# Patient Record
Sex: Female | Born: 1945 | Race: White | Hispanic: No | Marital: Married | Smoking: Never smoker
Health system: Southern US, Community
[De-identification: ages and names within clinical notes are randomized; demographics above are authoritative.]

## PROBLEM LIST (undated history)

## (undated) DIAGNOSIS — R51 Headache: Secondary | ICD-10-CM

## (undated) DIAGNOSIS — F329 Major depressive disorder, single episode, unspecified: Secondary | ICD-10-CM

## (undated) DIAGNOSIS — F32A Depression, unspecified: Secondary | ICD-10-CM

## (undated) DIAGNOSIS — K5790 Diverticulosis of intestine, part unspecified, without perforation or abscess without bleeding: Secondary | ICD-10-CM

## (undated) DIAGNOSIS — R112 Nausea with vomiting, unspecified: Secondary | ICD-10-CM

## (undated) DIAGNOSIS — R519 Headache, unspecified: Secondary | ICD-10-CM

## (undated) DIAGNOSIS — F419 Anxiety disorder, unspecified: Secondary | ICD-10-CM

## (undated) DIAGNOSIS — K219 Gastro-esophageal reflux disease without esophagitis: Secondary | ICD-10-CM

## (undated) DIAGNOSIS — G8929 Other chronic pain: Secondary | ICD-10-CM

## (undated) HISTORY — DX: Other chronic pain: G89.29

## (undated) HISTORY — DX: Headache, unspecified: R51.9

## (undated) HISTORY — DX: Anxiety disorder, unspecified: F41.9

## (undated) HISTORY — PX: COLONOSCOPY: SHX174

## (undated) HISTORY — DX: Major depressive disorder, single episode, unspecified: F32.9

## (undated) HISTORY — DX: Headache: R51

## (undated) HISTORY — DX: Gastro-esophageal reflux disease without esophagitis: K21.9

## (undated) HISTORY — DX: Diverticulosis of intestine, part unspecified, without perforation or abscess without bleeding: K57.90

## (undated) HISTORY — PX: TUBAL LIGATION: SHX77

## (undated) HISTORY — DX: Depression, unspecified: F32.A

---

## 1989-03-14 HISTORY — PX: ABDOMINAL HYSTERECTOMY: SHX81

## 2001-08-21 ENCOUNTER — Encounter: Payer: Self-pay | Admitting: Family Medicine

## 2001-08-21 LAB — CONVERTED CEMR LAB: Blood Glucose, Fasting: 92 mg/dL

## 2002-10-11 ENCOUNTER — Encounter: Payer: Self-pay | Admitting: Family Medicine

## 2002-10-11 LAB — CONVERTED CEMR LAB
RBC count: 4.33 10*6/uL
WBC, blood: 6.3 10*3/uL

## 2005-01-12 ENCOUNTER — Encounter: Payer: Self-pay | Admitting: Family Medicine

## 2005-02-07 ENCOUNTER — Ambulatory Visit: Payer: Self-pay | Admitting: Family Medicine

## 2005-03-10 ENCOUNTER — Ambulatory Visit: Payer: Self-pay | Admitting: Family Medicine

## 2005-05-06 ENCOUNTER — Ambulatory Visit: Payer: Self-pay | Admitting: Family Medicine

## 2005-05-16 ENCOUNTER — Ambulatory Visit: Payer: Self-pay | Admitting: Family Medicine

## 2005-09-09 ENCOUNTER — Ambulatory Visit: Payer: Self-pay | Admitting: Family Medicine

## 2005-11-25 ENCOUNTER — Ambulatory Visit: Payer: Self-pay | Admitting: Family Medicine

## 2006-01-27 ENCOUNTER — Ambulatory Visit: Payer: Self-pay | Admitting: Family Medicine

## 2006-01-27 LAB — CONVERTED CEMR LAB: Blood Glucose, Fasting: 98 mg/dL

## 2006-02-08 ENCOUNTER — Ambulatory Visit: Payer: Self-pay | Admitting: Family Medicine

## 2006-02-08 ENCOUNTER — Other Ambulatory Visit: Admission: RE | Admit: 2006-02-08 | Discharge: 2006-02-08 | Payer: Self-pay | Admitting: Family Medicine

## 2006-02-08 ENCOUNTER — Encounter: Payer: Self-pay | Admitting: Family Medicine

## 2006-02-24 ENCOUNTER — Ambulatory Visit: Payer: Self-pay | Admitting: Family Medicine

## 2006-03-08 ENCOUNTER — Ambulatory Visit: Payer: Self-pay | Admitting: Internal Medicine

## 2006-03-22 ENCOUNTER — Ambulatory Visit: Payer: Self-pay | Admitting: Family Medicine

## 2006-08-25 ENCOUNTER — Ambulatory Visit: Payer: Self-pay | Admitting: Family Medicine

## 2006-08-28 ENCOUNTER — Telehealth (INDEPENDENT_AMBULATORY_CARE_PROVIDER_SITE_OTHER): Payer: Self-pay | Admitting: *Deleted

## 2006-09-06 ENCOUNTER — Telehealth (INDEPENDENT_AMBULATORY_CARE_PROVIDER_SITE_OTHER): Payer: Self-pay | Admitting: *Deleted

## 2006-10-10 ENCOUNTER — Telehealth (INDEPENDENT_AMBULATORY_CARE_PROVIDER_SITE_OTHER): Payer: Self-pay | Admitting: *Deleted

## 2006-11-25 ENCOUNTER — Ambulatory Visit: Payer: Self-pay | Admitting: Internal Medicine

## 2006-12-08 ENCOUNTER — Telehealth: Payer: Self-pay | Admitting: Family Medicine

## 2007-02-21 ENCOUNTER — Encounter: Payer: Self-pay | Admitting: Family Medicine

## 2007-02-21 DIAGNOSIS — G43909 Migraine, unspecified, not intractable, without status migrainosus: Secondary | ICD-10-CM | POA: Insufficient documentation

## 2007-02-21 DIAGNOSIS — K219 Gastro-esophageal reflux disease without esophagitis: Secondary | ICD-10-CM

## 2007-02-21 DIAGNOSIS — M810 Age-related osteoporosis without current pathological fracture: Secondary | ICD-10-CM | POA: Insufficient documentation

## 2007-02-21 DIAGNOSIS — Z8669 Personal history of other diseases of the nervous system and sense organs: Secondary | ICD-10-CM

## 2007-02-21 DIAGNOSIS — F411 Generalized anxiety disorder: Secondary | ICD-10-CM

## 2007-02-21 DIAGNOSIS — F329 Major depressive disorder, single episode, unspecified: Secondary | ICD-10-CM

## 2007-02-21 DIAGNOSIS — F410 Panic disorder [episodic paroxysmal anxiety] without agoraphobia: Secondary | ICD-10-CM | POA: Insufficient documentation

## 2007-03-01 ENCOUNTER — Ambulatory Visit: Payer: Self-pay | Admitting: Family Medicine

## 2007-03-02 LAB — CONVERTED CEMR LAB
ALT: 19 units/L (ref 0–35)
AST: 19 units/L (ref 0–37)
Albumin: 4.1 g/dL (ref 3.5–5.2)
Alkaline Phosphatase: 72 units/L (ref 39–117)
BUN: 11 mg/dL (ref 6–23)
Bilirubin, Direct: 0.1 mg/dL (ref 0.0–0.3)
Calcium: 9.6 mg/dL (ref 8.4–10.5)
Chloride: 105 meq/L (ref 96–112)
GFR calc Af Amer: 110 mL/min
GFR calc non Af Amer: 91 mL/min
Total CHOL/HDL Ratio: 3.3
Triglycerides: 58 mg/dL (ref 0–149)
VLDL: 12 mg/dL (ref 0–40)

## 2007-03-05 ENCOUNTER — Ambulatory Visit: Payer: Self-pay | Admitting: Family Medicine

## 2007-03-06 ENCOUNTER — Telehealth: Payer: Self-pay | Admitting: Family Medicine

## 2007-03-19 ENCOUNTER — Telehealth (INDEPENDENT_AMBULATORY_CARE_PROVIDER_SITE_OTHER): Payer: Self-pay | Admitting: *Deleted

## 2007-03-26 ENCOUNTER — Encounter: Payer: Self-pay | Admitting: Family Medicine

## 2007-03-30 ENCOUNTER — Ambulatory Visit: Payer: Self-pay | Admitting: Family Medicine

## 2007-04-02 ENCOUNTER — Encounter (INDEPENDENT_AMBULATORY_CARE_PROVIDER_SITE_OTHER): Payer: Self-pay | Admitting: *Deleted

## 2007-09-11 ENCOUNTER — Telehealth: Payer: Self-pay | Admitting: Family Medicine

## 2007-12-10 ENCOUNTER — Ambulatory Visit: Payer: Self-pay | Admitting: Family Medicine

## 2007-12-17 ENCOUNTER — Telehealth: Payer: Self-pay | Admitting: Family Medicine

## 2007-12-17 ENCOUNTER — Ambulatory Visit: Payer: Self-pay | Admitting: Family Medicine

## 2007-12-20 ENCOUNTER — Telehealth: Payer: Self-pay | Admitting: Family Medicine

## 2008-01-18 ENCOUNTER — Telehealth: Payer: Self-pay | Admitting: Family Medicine

## 2008-02-12 ENCOUNTER — Telehealth: Payer: Self-pay | Admitting: Family Medicine

## 2008-02-27 ENCOUNTER — Telehealth: Payer: Self-pay | Admitting: Family Medicine

## 2008-06-09 ENCOUNTER — Ambulatory Visit: Payer: Self-pay | Admitting: Family Medicine

## 2008-06-09 DIAGNOSIS — E785 Hyperlipidemia, unspecified: Secondary | ICD-10-CM | POA: Insufficient documentation

## 2008-06-09 LAB — CONVERTED CEMR LAB
AST: 32 units/L (ref 0–37)
Alkaline Phosphatase: 79 units/L (ref 39–117)
Basophils Absolute: 0 10*3/uL (ref 0.0–0.1)
Basophils Relative: 0.6 % (ref 0.0–3.0)
Bilirubin, Direct: 0.1 mg/dL (ref 0.0–0.3)
CO2: 31 meq/L (ref 19–32)
Calcium: 9.3 mg/dL (ref 8.4–10.5)
Cholesterol: 253 mg/dL — ABNORMAL HIGH (ref 0–200)
Creatinine, Ser: 0.7 mg/dL (ref 0.4–1.2)
Creatinine,U: 88.1 mg/dL
Eosinophils Absolute: 0.1 10*3/uL (ref 0.0–0.7)
GFR calc non Af Amer: 90.06 mL/min (ref >60)
HDL: 63.7 mg/dL (ref >39.00)
Hemoglobin: 15 g/dL (ref 12.0–15.0)
Lymphocytes Relative: 30.4 % (ref 12.0–46.0)
MCHC: 33.8 g/dL (ref 30.0–36.0)
Microalb Creat Ratio: 11.4 mg/g (ref 0.0–30.0)
Monocytes Relative: 8 % (ref 3.0–12.0)
Neutrophils Relative %: 58.4 % (ref 43.0–77.0)
RBC: 4.89 M/uL (ref 3.87–5.11)
RDW: 12 % (ref 11.5–14.6)
Sodium: 143 meq/L (ref 135–145)
Total CHOL/HDL Ratio: 4
Triglycerides: 94 mg/dL (ref 0.0–149.0)

## 2008-06-10 LAB — CONVERTED CEMR LAB: Vit D, 25-Hydroxy: 18 ng/mL — ABNORMAL LOW (ref 30–89)

## 2008-06-11 ENCOUNTER — Ambulatory Visit: Payer: Self-pay | Admitting: Family Medicine

## 2008-06-25 ENCOUNTER — Encounter (INDEPENDENT_AMBULATORY_CARE_PROVIDER_SITE_OTHER): Payer: Self-pay | Admitting: *Deleted

## 2008-06-25 ENCOUNTER — Ambulatory Visit: Payer: Self-pay | Admitting: Family Medicine

## 2008-06-25 LAB — CONVERTED CEMR LAB
OCCULT 1: NEGATIVE
OCCULT 2: NEGATIVE

## 2008-07-01 ENCOUNTER — Telehealth: Payer: Self-pay | Admitting: Family Medicine

## 2008-07-08 ENCOUNTER — Encounter: Payer: Self-pay | Admitting: Family Medicine

## 2008-07-15 ENCOUNTER — Telehealth: Payer: Self-pay | Admitting: Family Medicine

## 2008-07-15 ENCOUNTER — Ambulatory Visit: Payer: Self-pay | Admitting: Family Medicine

## 2008-07-28 ENCOUNTER — Ambulatory Visit: Payer: Self-pay | Admitting: Family Medicine

## 2008-07-28 DIAGNOSIS — R141 Gas pain: Secondary | ICD-10-CM | POA: Insufficient documentation

## 2008-07-28 DIAGNOSIS — R143 Flatulence: Secondary | ICD-10-CM

## 2008-07-28 DIAGNOSIS — R142 Eructation: Secondary | ICD-10-CM

## 2008-08-01 ENCOUNTER — Ambulatory Visit: Payer: Self-pay | Admitting: Internal Medicine

## 2008-08-12 ENCOUNTER — Telehealth: Payer: Self-pay | Admitting: Family Medicine

## 2008-08-12 ENCOUNTER — Telehealth: Payer: Self-pay | Admitting: Internal Medicine

## 2008-08-14 ENCOUNTER — Telehealth: Payer: Self-pay | Admitting: Internal Medicine

## 2008-08-18 ENCOUNTER — Ambulatory Visit (HOSPITAL_COMMUNITY): Admission: RE | Admit: 2008-08-18 | Discharge: 2008-08-18 | Payer: Self-pay | Admitting: Internal Medicine

## 2008-09-08 ENCOUNTER — Ambulatory Visit: Payer: Self-pay | Admitting: Internal Medicine

## 2008-09-10 ENCOUNTER — Ambulatory Visit: Payer: Self-pay | Admitting: Family Medicine

## 2008-09-18 ENCOUNTER — Encounter (INDEPENDENT_AMBULATORY_CARE_PROVIDER_SITE_OTHER): Payer: Self-pay | Admitting: *Deleted

## 2008-09-18 ENCOUNTER — Telehealth: Payer: Self-pay | Admitting: Family Medicine

## 2008-09-19 ENCOUNTER — Telehealth: Payer: Self-pay | Admitting: Family Medicine

## 2008-09-23 ENCOUNTER — Telehealth: Payer: Self-pay | Admitting: Family Medicine

## 2008-09-24 ENCOUNTER — Ambulatory Visit: Payer: Self-pay | Admitting: Family Medicine

## 2008-10-02 ENCOUNTER — Telehealth: Payer: Self-pay | Admitting: Family Medicine

## 2008-10-13 ENCOUNTER — Telehealth: Payer: Self-pay | Admitting: Family Medicine

## 2008-10-14 ENCOUNTER — Telehealth: Payer: Self-pay | Admitting: Internal Medicine

## 2008-10-15 ENCOUNTER — Ambulatory Visit: Payer: Self-pay | Admitting: Gastroenterology

## 2008-10-15 DIAGNOSIS — R1013 Epigastric pain: Secondary | ICD-10-CM

## 2008-10-16 ENCOUNTER — Ambulatory Visit (HOSPITAL_COMMUNITY): Admission: RE | Admit: 2008-10-16 | Discharge: 2008-10-16 | Payer: Self-pay | Admitting: Gastroenterology

## 2008-10-21 ENCOUNTER — Telehealth: Payer: Self-pay | Admitting: Internal Medicine

## 2008-10-24 ENCOUNTER — Telehealth: Payer: Self-pay | Admitting: Physician Assistant

## 2008-11-10 ENCOUNTER — Ambulatory Visit: Payer: Self-pay | Admitting: Internal Medicine

## 2008-12-01 ENCOUNTER — Telehealth: Payer: Self-pay | Admitting: Internal Medicine

## 2009-01-20 ENCOUNTER — Telehealth: Payer: Self-pay | Admitting: Internal Medicine

## 2009-03-10 ENCOUNTER — Ambulatory Visit: Payer: Self-pay | Admitting: Family Medicine

## 2009-06-10 ENCOUNTER — Ambulatory Visit: Payer: Self-pay | Admitting: Family Medicine

## 2009-06-10 LAB — CONVERTED CEMR LAB
Albumin: 4.2 g/dL (ref 3.5–5.2)
BUN: 12 mg/dL (ref 6–23)
Basophils Absolute: 0 10*3/uL (ref 0.0–0.1)
Calcium: 9.6 mg/dL (ref 8.4–10.5)
Cholesterol: 231 mg/dL — ABNORMAL HIGH (ref 0–200)
Creatinine, Ser: 0.7 mg/dL (ref 0.4–1.2)
Eosinophils Absolute: 0.1 10*3/uL (ref 0.0–0.7)
GFR calc non Af Amer: 89.76 mL/min (ref >60)
Glucose, Bld: 94 mg/dL (ref 70–99)
HDL: 56.8 mg/dL (ref >39.00)
Hemoglobin: 14.2 g/dL (ref 12.0–15.0)
Lymphocytes Relative: 27.1 % (ref 12.0–46.0)
MCHC: 33.2 g/dL (ref 30.0–36.0)
Neutro Abs: 3 10*3/uL (ref 1.4–7.7)
Platelets: 186 10*3/uL (ref 150.0–400.0)
RDW: 11.8 % (ref 11.5–14.6)
Triglycerides: 138 mg/dL (ref 0.0–149.0)
VLDL: 27.6 mg/dL (ref 0.0–40.0)

## 2009-06-11 LAB — CONVERTED CEMR LAB: Vit D, 25-Hydroxy: 20 ng/mL — ABNORMAL LOW (ref 30–89)

## 2009-06-17 ENCOUNTER — Ambulatory Visit: Payer: Self-pay | Admitting: Family Medicine

## 2009-06-22 ENCOUNTER — Telehealth: Payer: Self-pay | Admitting: Family Medicine

## 2009-06-23 ENCOUNTER — Telehealth: Payer: Self-pay | Admitting: Family Medicine

## 2009-07-07 ENCOUNTER — Telehealth: Payer: Self-pay | Admitting: Family Medicine

## 2009-07-09 ENCOUNTER — Encounter: Payer: Self-pay | Admitting: Family Medicine

## 2009-07-14 ENCOUNTER — Telehealth: Payer: Self-pay | Admitting: Family Medicine

## 2009-07-27 ENCOUNTER — Telehealth (INDEPENDENT_AMBULATORY_CARE_PROVIDER_SITE_OTHER): Payer: Self-pay | Admitting: *Deleted

## 2009-07-30 ENCOUNTER — Telehealth: Payer: Self-pay | Admitting: Family Medicine

## 2009-07-30 ENCOUNTER — Ambulatory Visit: Payer: Self-pay | Admitting: Family Medicine

## 2009-10-14 ENCOUNTER — Encounter (INDEPENDENT_AMBULATORY_CARE_PROVIDER_SITE_OTHER): Payer: Self-pay | Admitting: *Deleted

## 2009-12-03 ENCOUNTER — Telehealth: Payer: Self-pay | Admitting: Family Medicine

## 2009-12-08 ENCOUNTER — Encounter: Payer: Self-pay | Admitting: Family Medicine

## 2010-01-01 ENCOUNTER — Telehealth: Payer: Self-pay | Admitting: Family Medicine

## 2010-04-14 NOTE — Progress Notes (Signed)
Summary:  CLORAZEPATE DIPOTASSIUM 3.75 MG  TABS ??about urine  Phone Note Refill Request Call back at Home Phone [phone] Message from:  Patient on Jul 14, 2009 11:07 AM  Refills Requested: Medication #1:  CLORAZEPATE DIPOTASSIUM 3.75 MG  TABS 1 twice a day by mouth Patient is requesting written rx. Leave message.  Patient is also asking if she should have had her urine checked at last cpx. Please advise.    Method Requested: Pick up at Office Initial call taken by: Melody Comas,  Jul 14, 2009 11:07 AM  Follow-up for Phone Call        Not necessarily. Follow-up by: Shaune Leeks MD,  Jul 14, 2009 1:07 PM  Additional Follow-up for Phone Call Additional follow up Details #1::        Patient notified that rx at front office ready for pick up. Also notified that urine was not needed.  Additional Follow-up by: Melody Comas,  Jul 14, 2009 1:12 PM    Prescriptions: CLORAZEPATE DIPOTASSIUM 3.75 MG  TABS (CLORAZEPATE DIPOTASSIUM) 1 twice a day by mouth  #180 x 1   Entered and Authorized by:   Shaune Leeks MD   Signed by:   Shaune Leeks MD on 07/14/2009   Method used:   Print then Give to Patient   RxID:   [id number]

## 2010-04-14 NOTE — Assessment & Plan Note (Signed)
Summary: FLU VACCINE/ DUNCAN   Nurse Visit   Allergies: 1)  ! Amoxicillin 2)  ! * Penicillin  Shot 3)  ! Keflex  Orders Added: 1)  Admin 1st Vaccine [90471] 2)  Flu Vaccine 51yrs + [30865]  Flu Vaccine Consent Questions     Do you have a history of severe allergic reactions to this vaccine? no    Any prior history of allergic reactions to egg and/or gelatin? no    Do you have a sensitivity to the preservative Thimersol? no    Do you have a past history of Guillan-Barre Syndrome? no    Do you currently have an acute febrile illness? no    Have you ever had a severe reaction to latex? no    Vaccine information given and explained to patient? yes    Are you currently pregnant? no    Lot Number:[id number]   Exp Date:09/11/2010   Site Given  Left Deltoid IM

## 2010-04-14 NOTE — Progress Notes (Signed)
Summary: change omeprazole  Phone Note From Pharmacy   [facility] Summary of Call: Form asking to change from omeprazole tablets to capsules is on your shelf. Initial call taken by: Lowella Petties CMA,  July 07, 2009 3:27 PM  Follow-up for Phone Call        Capsules are fine!! Follow-up by: Shaune Leeks MD,  July 07, 2009 4:34 PM  Additional Follow-up for Phone Call Additional follow up Details #1::        Form faxed. Additional Follow-up by: Lowella Petties CMA,  July 07, 2009 4:48 PM

## 2010-04-14 NOTE — Progress Notes (Signed)
Summary: Refile labs per pt request   Called pt informed of refiling claim and sent to billing to handle.Apolinar Junes  ---- Converted from flag ---- ---- 07/24/2009 1:37 PM, Earlyne Iba wrote: Please let pro-fee know that you would like to refile this charge since I cannot access the charge since its already batched.  Thanks  ---- 07/24/2009 1:10 PM, Daine Gip wrote: Cherylann Ratel, Pt says her ins will pay at 100% when billed as V70.0.Marland KitchenMarland KitchenLet's refile if we can.  Gweneth Dimitri w/ pt --told her we would resubmit claim per Dr. Hetty Ely ....cdavis 07-27-2009  ---- 07/24/2009 9:02 AM, Earlyne Iba wrote: Dr. Hetty Ely,  Most of the labs are not billed with V70.0 because specific covered diagnosis are required for these lab charges. We append diagnosis to the lab charges based on LCD to support medical necessity.   Lab charges are subject to coinsurance and deductible, so regardless of if we bill with V70.0, it is not paid at 100% of the allowed amount like the preventive office visits. Please let me know if you have any questions  Thanks Lateef ------------------------------

## 2010-04-14 NOTE — Progress Notes (Signed)
Summary: regarding librium dose  Phone Note Call from Patient Call back at Home Phone [phone]   Caller: Patient Call For: Shaune Leeks MD Summary of Call: Pt is questioning the dosing on the librium, new order is to take one at night.  She says she used to take one three times a day with meals.  Please advise.  Also , she will need written 90 day scripts for all [facility] Initial call taken by: Lowella Petties CMA,  June 23, 2009 11:10 AM  Follow-up for Phone Call        I dosed it the way the chart said she was taking the Librax, as it was substituted for the Librax. I'm assuming this is for IBS not nerves. If for nerves, need to understand not to incr dose...this would be significant dose for nerves. For GI settling, this dose is ok. Follow-up by: Shaune Leeks MD,  June 23, 2009 12:42 PM  Additional Follow-up for Phone Call Additional follow up Details #1::        Advised pt of librium dose, but she does still need written scripts for all her meds to send to mail order. Additional Follow-up by: Lowella Petties CMA,  June 24, 2009 10:55 AM    Additional Follow-up for Phone Call Additional follow up Details #2::    Please let pt know, she should not need Chlorazepate and Librium together. They are from the same family, both essentially sedatives. Follow-up by: Shaune Leeks MD,  June 24, 2009 1:47 PM  Additional Follow-up for Phone Call Additional follow up Details #3:: Details for Additional Follow-up Action Taken: Advised pt, she said she would rather have the tranxene than the librium.  She asks if she should continue to take what she has until gone.            Lowella Petties CMA  June 24, 2009 3:05 PM Yes, will try to straighten it out then. Shaune Leeks MD  June 24, 2009 5:08 PM   Dr. Hetty Ely, I'm sorry for the confusion over this but she still needs a written script for tranxene for mail order.  Do you want her  to finish her librium first and then give her a  script for tranxene? Lowella Petties CMA  June 25, 2009 8:20 AM No problem...would rather get things right. I would prefer that so she is not tempted or poss gets confused and takes both at the same time.Shaune Leeks MD  June 25, 2009 8:29 AM   Advised pt, she picked up the scripts for omeprazole and amitriptyline. Additional Follow-up by: Lowella Petties CMA,  June 25, 2009 10:52 AM  New/Updated Medications: CHLORDIAZEPOXIDE HCL 5 MG CAPS (CHLORDIAZEPOXIDE HCL) one tab by mouth 3 times a day with meals  as needed CHLORDIAZEPOXIDE HCL 5 MG CAPS (CHLORDIAZEPOXIDE HCL) one tab by mouth 1/2 hour before each meal  as needed Prescriptions: AMITRIPTYLINE HCL 25 MG TABS (AMITRIPTYLINE HCL) one tab by mouth at night.  #90 x 3   Entered and Authorized by:   Shaune Leeks MD   Signed by:   Shaune Leeks MD on 06/24/2009   Method used:   Print then Give to Patient   RxID:   [id number] OMEPRAZOLE 20 MG TBEC (OMEPRAZOLE) Take 1 tablet by mouth once a day30 minutes before breakfast  #90 x 3   Entered and Authorized by:   Shaune Leeks MD   Signed by:  Shaune Leeks MD on 06/24/2009   Method used:   Print then Give to Patient   RxID:   [id number] CHLORDIAZEPOXIDE HCL 5 MG CAPS (CHLORDIAZEPOXIDE HCL) one tab by mouth 3 times a day with meals  as needed  #90 x 3   Entered by:   Sydell Axon LPN   Authorized by:   Shaune Leeks MD   Signed by:   Sydell Axon LPN on 14/78/2956   Method used:   Telephoned to ...       [facility] [address])       [address]       [place], [place]  [address]       Ph: [phone]       Fax: [phone]   RxID:   [id number]

## 2010-04-14 NOTE — Assessment & Plan Note (Signed)
Summary: CPX/DLO   Vital Signs:  Patient profile:   65 year old female Weight:      113 pounds Temp:     97.5 degrees F oral Pulse rate:   80 / minute Pulse rhythm:   regular BP sitting:   108 / 76  (left arm) Cuff size:   regular  Vitals Entered By: Sydell Axon LPN (June 17, 452 2:47 PM) CC: 30 Minute checkup, had a colonoscopy 06/10 by Dr. Leone Payor   History of Present Illness: Pt here for Comp Exam. She is still burping and has finally thinking it is probably something she eats. She is actually thinking milk could be the problem. Pain actually more on right side. She uses Librax and Librium regularly. She is also using Amitriptyline at higher dose for drying the mouth.  She otherwise has gas pain and is constipated a lot. Her husband takes Metamucil.  Husband doing better since she is home regularly since being laid off...she is home more and he is more relaxed and therefore less stress on her.  Preventive Screening-Counseling & Management  Alcohol-Tobacco     Alcohol drinks/day: 0     Smoking Status: never     Passive Smoke Exposure: no  Caffeine-Diet-Exercise     Caffeine use/day: 2     Does Patient Exercise: yes     Type of exercise: aerobics, [facility]     Exercise (avg: min/session): 30-60     Times/week: <3  Problems Prior to Update: 1)  Diverticulosis-colon  (ICD-562.10) 2)  Abdominal Pain-epigastric  (ICD-789.06) 3)  Flatulence-gas-bloating  (ICD-787.3) 4)  Dyspepsia&other Spec Disorders Function Stomach  (ICD-536.8) 5)  Chest Pain/tightness  (ICD-786.50) 6)  Dysphagia  (ICD-787.29) 7)  Belching  (ICD-787.3) 8)  Other and Unspecified Hyperlipidemia  (ICD-272.4) 9)  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 Take One By Mouth At Bedtime 2)  Centrum   Chew (Multiple Vitamins-Minerals) .Marland Kitchen.. 1 Daily By Mouth 3)  Clorazepate Dipotassium 3.75 Mg  Tabs (Clorazepate Dipotassium) .Marland Kitchen.. 1 Twice A Day By Mouth 4)  Estradiol 0.5 Mg  Tabs (Estradiol) .Marland Kitchen.. 1 Daily By Mouth 5)  Tums Calcium For Life Bone 750 Mg Chew (Calcium Carbonate Antacid) .... Take Two Tablet By Mouth in The Morning 6)  Tylenol Extra Strength 500 Mg Tabs (Acetaminophen) .... Take Two Tablets By Mouth As Needed For Headaches 7)  Librax 2.5-5 Mg Caps (Clidinium-Chlordiazepoxide) .... Take 1/2 Hour Before Each Meal 8)  Omeprazole 20 Mg Tbec (Omeprazole) .... Take 1 Tablet By Mouth Once A Day30 Minutes Before Breakfast  Allergies: 1)  ! Amoxicillin 2)  ! * Penicillin  Shot 3)  ! Keflex  Past History:  Past Medical History: Last updated: 08/01/2008 Anxiety( years DR. MCNEIL) Depression:( YEARS PER DR. MCNEIL) GERD:(09/2003) Osteoporosis:(08/2001) Chronic Headaches  Past Surgical History: Last updated: 09/10/2008 NSVD x2 1967  1973 BTL 1974 PARTIAL HYSTERECTOMY DUE TO DYSMENN & PELVIC RELAXATION  1991 DEXA , OSTEOPO L/S -2.5; HIP -2.9:(09/06/2001) CT SINUS: NORMAL(11/14/2003) DEXA :(03/08/2006) UGI Duod Divertic O/W  Nml  (08/18/2008) COLONOSCOPY DIVERTICS (  DR Leone Payor) (09/08/2008)  Family History: Last updated: 2009/07/16 Father:DECEASED 71 YOA MI STROKE, HTN  Mother: DECEASED 70 YOA BRIGHTS DZ. 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  Alcohol Use - no Illicit Drug Use - no Patient gets regular exercise. Daily Caffeine Use: one cup coffee daily  Risk Factors: Alcohol Use: 0 (2009/07/16) Caffeine Use: 2 (Jul 16, 2009) Exercise: yes (16-Jul-2009)  Risk Factors: Smoking Status: never (07/16/2009) Passive Smoke Exposure: no (16-Jul-2009)  Family History: Father:DECEASED 85 YOA MI STROKE, HTN  Mother: DECEASED 31 YOA BRIGHTS DZ. 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  Alcohol Use - no Illicit Drug Use - no Patient gets regular exercise. Daily Caffeine Use: one cup coffee daily  Physical Exam  General:  Well-developed,well-nourished,in no acute distress; alert,appropriate and cooperative throughout examination Head:  Normocephalic and atraumatic without obvious abnormalities. No apparent alopecia or balding. Minimal discomfort with facial pressure. Eyes:  Conjunctiva clear bilaterally.  Ears:  Cerumen occluding bilat. Nose:  External nasal examination shows no deformity or inflammation. Nasal mucosa are pink and moist without lesions or exudates. Mild inflammation. Mouth:  Oral mucosa and oropharynx without lesions or exudates.  Teeth in good repair. Neck:  No deformities,  masses, or tenderness noted. Chest Wall:  No deformities, masses, or tenderness noted. Breasts:  No mass, nodules, thickening, tenderness, bulging, retraction, inflamation, nipple discharge or skin changes noted.   Lungs:  Normal respiratory effort, chest expands symmetrically. Lungs are clear to auscultation, no crackles or wheezes. Heart:  Normal rate and regular rhythm. S1 and S2 normal without gallop, murmur, click, rub or other extra sounds. Abdomen:  Bowel sounds positive,abdomen soft and non-tender without masses, organomegaly or hernias noted.  No obvious masses. Bowel sounds are quite active, able to be easily heard w/o augmentation. Rectal:  Not done, sees Dr Leone Payor. Genitalia:  Not done, had Hyst years ago for dysmenn and pelvic relaxation.   Impression & Recommendations:  Problem # 1:  HEALTH MAINTENANCE EXAM (ICD-V70.0) Dexa next year.  Mammo to be scheduled. Had hyst...does not need Pap again. Immun UTD. Discussed Zostavax. Sees Dr Leone Payor for Colon Ca screening.  Problem # 2:  DIVERTICULOSIS-COLON (ICD-562.10) Assessment: Unchanged  Discussed being seen for prolonged LLQ discomfort.  Colonoscopy:  Labs Reviewed: Hgb: 14.2 (06/10/2009)   Hct: 42.8 (06/10/2009)   WBC: 4.7 (06/10/2009)  Problem # 3:  FLATULENCE-GAS-BLOATING (ICD-787.3) Assessment: Improved See instructions. Discussed avoiding milk and milk products.  Problem # 4:  OTHER AND UNSPECIFIED HYPERLIPIDEMIA (ICD-272.4) Assessment: Unchanged LDLD too high,  avoid fatty food. Do not want to start Statin at current time due to GI probs ongoing. May try in future. 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 Good job with sweets and  carbs.  Problem # 6:  ANXIETY (ICD-300.00) Assessment: Improved Husband doing better since she is home fulltime. Her updated medication list for this problem includes:    Amitriptyline Hcl 10 Mg Tabs (Amitriptyline hcl) .Marland Kitchen... Take one by mouth at bedtime    Clorazepate Dipotassium 3.75 Mg Tabs (Clorazepate dipotassium) .Marland Kitchen... 1 twice a day by mouth  Problem # 7:  DEPRESSION (ICD-311) Assessment: Improved  Also  better since home all the time. Her updated medication list for this problem includes:    Amitriptyline Hcl 10 Mg Tabs (Amitriptyline hcl) .Marland Kitchen... Take one by mouth at bedtime    Clorazepate Dipotassium 3.75 Mg Tabs (Clorazepate dipotassium) .Marland Kitchen... 1 twice a day by mouth  Complete Medication List: 1)  Amitriptyline Hcl 10 Mg Tabs (Amitriptyline hcl) .... Take one by mouth at bedtime 2)  Centrum Chew (Multiple vitamins-minerals) .Marland Kitchen.. 1 daily by mouth 3)  Clorazepate Dipotassium 3.75 Mg Tabs (Clorazepate dipotassium) .Marland Kitchen.. 1 twice a day by mouth 4)  Tums Calcium For Life Bone 750 Mg Chew (Calcium carbonate antacid) .... Take two tablet by mouth in the morning 5)  Tylenol Extra Strength 500 Mg Tabs (Acetaminophen) .... Take two tablets by mouth as needed for headaches 6)  Librax 2.5-5 Mg Caps (Clidinium-chlordiazepoxide) .... Take 1/2 hour before each meal 7)  Omeprazole 20 Mg Tbec (Omeprazole) .... Take 1 tablet by mouth once a day30 minutes before breakfast  Other Orders: Radiology Referral (Radiology)  Patient Instructions: 1)  Refer for Mammo. 2)  Try Lactaid for your coffeee in the AM.  3)  Start on Citrucel one tsp in 8 oz of water daily.  Prescriptions: AMITRIPTYLINE HCL 10 MG TABS (AMITRIPTYLINE HCL) take one by mouth at bedtime  #90 x 3   Entered and Authorized by:   Shaune Leeks MD   Signed by:   Shaune Leeks MD on 06/17/2009   Method used:   Electronically to        [facility] [address] ([facility])       [address], [address]        [place], [place]  [address]       Ph: [phone]       Fax: [phone]   RxID:   [id number] CLORAZEPATE DIPOTASSIUM 3.75 MG  TABS (CLORAZEPATE DIPOTASSIUM) 1 twice a day by mouth  #180 x 3   Entered and Authorized by:   Shaune Leeks MD   Signed by:   Shaune Leeks MD on 06/17/2009   Method used:   Print then Give to Patient   RxID:   [id number]   Current Allergies (reviewed today): ! AMOXICILLIN ! * PENICILLIN  SHOT ! KEFLEX

## 2010-04-14 NOTE — Progress Notes (Signed)
Summary: headache   Phone Note Call from Patient Call back at Home Phone [phone]   Caller: Patient Call For: Shaune Leeks MD Summary of Call: Patient states that she has had a headache for the the past week and that she get these quite often. She has tried taking tylenol and it' s not doing anything for it. She says that in the past Dr. Hetty Ely would prescribe her darvocet and she is asking if she could get a few [facility] to [facility] [address].  Initial call taken by: Melody Comas,  January 01, 2010 10:35 AM  Follow-up for Phone Call        darvocet is off the market.  pt will need to come in for headache evaluation.  could try alternating tylenol/alleve for headache, but if using too much analgesics could actually be worsening headaches.  coudl also try benadryl OTC at night to help sleep headache away. Follow-up by: Eustaquio Boyden  MD,  January 01, 2010 11:11 AM  Additional Follow-up for Phone Call Additional follow up Details #1::        Patient notified, will try alternating tylenol/alieve for headache and will also try the bendadryl if that doesn't help. Will call back if needs appt.  Additional Follow-up by: Melody Comas,  January 01, 2010 1:49 PM

## 2010-04-14 NOTE — Assessment & Plan Note (Signed)
Summary: ZOTAVAX   Nurse Visit   Allergies: 1)  ! Amoxicillin 2)  ! * Penicillin  Shot 3)  ! Keflex  Immunizations Administered:  Zostavax # 1:    Vaccine Type: Zostavax    Site: left deltoid    Mfr: Merck    Dose: 0.5 ml    Route: Sesser    Given by: Lowella Petties CMA    Exp. Date: 08/21/2010    Lot #: [id number]    VIS given: 12/24/04 given Jul 30, 2009.  Orders Added: 1)  Zoster (Shingles) Vaccine Live [90736] 2)  Admin 1st Vaccine [phone]

## 2010-04-14 NOTE — Letter (Signed)
Summary: Schaller Retirement letter  [facility] [place]  [address]   [place], [place] [address]   Phone: [phone]  Fax: [phone]       10/14/2009 MRN: [id number]  Kristen Caldwell [address] [place], [place]  [address]  Dear [name],  [facility] [place], and [facility] announce the retirement of ROBERT N. SCHALLER, M.D., from full-time practice at the [place] office effective September 10, 2009 and his plans of returning part-time.  It is important to Dr. Schaller and to our practice that you understand that [facility] [place] has seven physicians in our office for your health care needs.  We will continue to offer the same exceptional care that you have today.    Dr. Schaller has spoken to many of you about his plans for retirement and returning part-time in the fall.   We will continue to work with you through the transition to schedule appointments for you in the office and meet the high standards that [facility] is committed to.   Again, it is with great pleasure that we share the news that Dr. Schaller will return to [facility] [place] in October of 2011 with a reduced schedule.    If you have any questions, or would like to request an appointment with one of our physicians, please call us at [phone] and press the option for Scheduling an appointment.  We take pleasure in providing you with excellent patient care and look forward to seeing you at your next office visit.  Our [place] Physicians are:  Richard Letvak, M.D. Robert Schaller, M.D. Marne Tower, M.D. Amy Bedsole, M.D. Spencer Copland, M.D. Talia Aron, M.D. We proudly welcomed Shaw Duncan, M.D. and Javier Gutierrez, M.D. to the practice in July/August 2011.  Sincerely,  [facility] [place] 

## 2010-04-14 NOTE — Progress Notes (Signed)
Summary: refill request for clorazepate  Phone Note Refill Request Call back at Home Phone [phone] Message from:  Patient  Refills Requested: Medication #1:  CLORAZEPATE DIPOTASSIUM 3.75 MG  TABS 1 twice a day by mouth [facility]  Initial call taken by: Lowella Petties CMA,  December 03, 2009 11:17 AM  Follow-up for Phone Call        please fax to pharmacy for mail order.  I printed and signed.  Follow-up by: Crawford Givens MD,  December 03, 2009 1:45 PM  Additional Follow-up for Phone Call Additional follow up Details #1::        Faxed Additional Follow-up by: Delilah Shan CMA (AAMA),  December 03, 2009 4:00 PM    Prescriptions: CLORAZEPATE DIPOTASSIUM 3.75 MG  TABS (CLORAZEPATE DIPOTASSIUM) 1 twice a day by mouth  #180 x 1   Entered and Authorized by:   Crawford Givens MD   Signed by:   Crawford Givens MD on 12/03/2009   Method used:   Printed then faxed to ...       [facility] [address] ([facility]ail)       [address], [address]       [place], [place]  [address]       Ph: [phone]       Fax: [phone]   RxID:   [id number]

## 2010-04-14 NOTE — Progress Notes (Signed)
Summary: regarding amitriptyline dose  Phone Note Call from Patient Call back at [phone]   Caller: Patient Call For: Shaune Leeks MD Summary of Call: Pt states you were going to increase her amitriptyline to 25 mg's but when she went to pick it up it had been sent in as 10 mg's, so she didnt get script.  She is asking that we change that and also call in librium 5 mg's.  She says she started back taking that instead of librax, uses [facility] [address]. Initial call taken by: Lowella Petties CMA,  June 22, 2009 10:22 AM  Follow-up for Phone Call        [facility] [address]. Follow-up by: Lowella Petties CMA,  June 22, 2009 4:34 PM    New/Updated Medications: AMITRIPTYLINE HCL 25 MG TABS (AMITRIPTYLINE HCL) one tab by mouth at night. CHLORDIAZEPOXIDE HCL 5 MG CAPS (CHLORDIAZEPOXIDE HCL) one tab by mouth at night as needed Prescriptions: CHLORDIAZEPOXIDE HCL 5 MG CAPS (CHLORDIAZEPOXIDE HCL) one tab by mouth at night as needed  #30 x 5   Entered and Authorized by:   Shaune Leeks MD   Signed by:   Shaune Leeks MD on 06/22/2009   Method used:   Telephoned to ...       [facility] [address]       [address]       [place], [place]  [address]       Ph: [phone]       Fax: [phone]   RxID:   [id number] AMITRIPTYLINE HCL 25 MG TABS (AMITRIPTYLINE HCL) one tab by mouth at night.  #30 x 6   Entered and Authorized by:   Shaune Leeks MD   Signed by:   Shaune Leeks MD on 06/22/2009   Method used:   Electronically to        [facility] [address]       [address], [address]       [place], [place]  [address]       Ph: [phone]       Fax: [phone]   RxID:   [id number]

## 2010-04-14 NOTE — Letter (Signed)
Summary: Nadara Eaton letter  [place] [facility]  [address] [place], [place] [address]   Phone: [phone]  Fax: [phone]       10/14/2009 MRN: [id number]  Kristen Caldwell [address] [place], [place]  [address]  Dear Ms. [facility] [place] and [facility] announce the retirement of Arta Silence, M.D., from full-time practice at the [facility] office effective September 10, 2009 and his plans of returning part-time.  It is important to Dr. Hetty Ely and to our practice that you understand that [facility] has seven physicians in our office for your health care needs.  We will continue to offer the same exceptional care that you have today.    Dr. Hetty Ely has spoken to many of you about his plans for retirement and returning part-time in the fall.   We will continue to work with you through the transition to schedule appointments for you in the office and meet the high standards that [facility] is committed to.   Again, it is with great pleasure that we share the news that Dr. Hetty Ely will return to [facility] in October of 2011 with a reduced schedule.    If you have any questions, or would like to request an appointment with one of our physicians, please call us at [phone] and press the option for Scheduling an appointment.  We take pleasure in providing you with excellent patient care and look forward to seeing you at your next office visit.  Our [facility] Physicians are:  Tillman Abide, M.D. Laurita Quint, M.D. Roxy Manns, M.D. Kerby Nora, M.D. Hannah Beat, M.D. Ruthe Mannan, M.D. We proudly welcomed Raechel Ache, M.D. and Eustaquio Boyden, M.D. to the practice in July/August 2011.  Sincerely,  [facility]

## 2010-04-14 NOTE — Progress Notes (Signed)
Summary: should pt get zostavax?  Phone Note Call from Patient Call back at [phone]   Caller: Patient Call For: Shaune Leeks MD Summary of Call: Pt's husband has shingles and she is asking if she should get the vaccine.  She is going to check with her insurance company to make sure that it's covered. Initial call taken by: Lowella Petties CMA,  Jul 30, 2009 10:14 AM  Follow-up for Phone Call        If she can afford it, not a bad idea. Cheapest thru local pharmacy. Follow-up by: Shaune Leeks MD,  Jul 30, 2009 10:18 AM  Additional Follow-up for Phone Call Additional follow up Details #1::        Pt checked with insurance, they wont pay at a pharmacy, pt coming in today for vaccine. Additional Follow-up by: Lowella Petties CMA,  Jul 30, 2009 10:24 AM

## 2010-05-12 ENCOUNTER — Encounter: Payer: Self-pay | Admitting: Family Medicine

## 2010-06-23 ENCOUNTER — Other Ambulatory Visit: Payer: Self-pay | Admitting: Family Medicine

## 2010-06-23 ENCOUNTER — Other Ambulatory Visit (INDEPENDENT_AMBULATORY_CARE_PROVIDER_SITE_OTHER): Payer: BLUE CROSS/BLUE SHIELD | Admitting: Family Medicine

## 2010-06-23 DIAGNOSIS — Z0000 Encounter for general adult medical examination without abnormal findings: Secondary | ICD-10-CM

## 2010-06-23 DIAGNOSIS — E785 Hyperlipidemia, unspecified: Secondary | ICD-10-CM

## 2010-06-23 LAB — BASIC METABOLIC PANEL
Calcium: 9.6 mg/dL (ref 8.4–10.5)
Chloride: 101 mEq/L (ref 96–112)
Creatinine, Ser: 0.6 mg/dL (ref 0.4–1.2)

## 2010-06-23 LAB — LIPID PANEL
Cholesterol: 237 mg/dL — ABNORMAL HIGH (ref 0–200)
Total CHOL/HDL Ratio: 4
Triglycerides: 124 mg/dL (ref 0.0–149.0)
VLDL: 24.8 mg/dL (ref 0.0–40.0)

## 2010-06-23 LAB — HEPATIC FUNCTION PANEL
Bilirubin, Direct: 0.1 mg/dL (ref 0.0–0.3)
Total Bilirubin: 0.7 mg/dL (ref 0.3–1.2)

## 2010-06-28 ENCOUNTER — Encounter: Payer: Self-pay | Admitting: Family Medicine

## 2010-06-30 ENCOUNTER — Ambulatory Visit (INDEPENDENT_AMBULATORY_CARE_PROVIDER_SITE_OTHER): Payer: BC Managed Care – PPO | Admitting: Family Medicine

## 2010-06-30 ENCOUNTER — Encounter: Payer: Self-pay | Admitting: Family Medicine

## 2010-06-30 ENCOUNTER — Other Ambulatory Visit: Payer: Self-pay | Admitting: Family Medicine

## 2010-06-30 ENCOUNTER — Other Ambulatory Visit (HOSPITAL_COMMUNITY)
Admission: RE | Admit: 2010-06-30 | Discharge: 2010-06-30 | Disposition: A | Discharge status: Home / Self Care | Payer: BC Managed Care – PPO | Source: Ambulatory Visit | Attending: Family Medicine | Admitting: Family Medicine

## 2010-06-30 VITALS — BP 120/86 | HR 76 | Temp 98.1°F | Ht 62.0 in | Wt 115.8 lb

## 2010-06-30 DIAGNOSIS — M810 Age-related osteoporosis without current pathological fracture: Secondary | ICD-10-CM

## 2010-06-30 DIAGNOSIS — Z1231 Encounter for screening mammogram for malignant neoplasm of breast: Secondary | ICD-10-CM

## 2010-06-30 DIAGNOSIS — Z01419 Encounter for gynecological examination (general) (routine) without abnormal findings: Secondary | ICD-10-CM | POA: Insufficient documentation

## 2010-06-30 DIAGNOSIS — Z0000 Encounter for general adult medical examination without abnormal findings: Secondary | ICD-10-CM

## 2010-06-30 DIAGNOSIS — E785 Hyperlipidemia, unspecified: Secondary | ICD-10-CM

## 2010-06-30 MED ORDER — OMEPRAZOLE 20 MG PO CPDR: 20.0000 mg | DELAYED_RELEASE_CAPSULE | Freq: Every day | ORAL | Status: DC

## 2010-06-30 MED ORDER — CLOBETASOL PROPIONATE EMULSION 0.05 % EX FOAM: 1.0000 "application " | Freq: Every evening | CUTANEOUS | Status: DC

## 2010-06-30 MED ORDER — AMITRIPTYLINE HCL 10 MG PO TABS: 10.0000 mg | ORAL_TABLET | Freq: Every day | ORAL | Status: DC

## 2010-06-30 MED ORDER — CLORAZEPATE DIPOTASSIUM 3.75 MG PO TABS: 3.7500 mg | ORAL_TABLET | Freq: Two times a day (BID) | ORAL | Status: DC

## 2010-06-30 MED ORDER — SIMVASTATIN 5 MG PO TABS: 5.0000 mg | ORAL_TABLET | Freq: Every day | ORAL | Status: DC

## 2010-06-30 NOTE — Progress Notes (Signed)
65 yo new to me here for CPX.  HLD- LDL increased to 161.4. Pt has never been on cholesterol medications, feels her diet is very good. Never eats fried foods, does not like to add butter to things.  GERD- symptoms improved, does still have some belching.  Up to date on colonscopy. She is unsure if she still has a cervix but is s/p hysterectomy. Due for mammogram. Immunizations UTD.  The PMH, PSH, Social History, Family History, Medications, and allergies have been reviewed in [facility], and have been updated if relevant.  ROS: General: Denies fever, chills, sweats. No significant weight loss. Eyes: Denies blurring,significant itching ENT: Denies earache, sore throat, and hoarseness.  Cardiovascular: Denies chest pains, palpitations, dyspnea on exertion,  Respiratory: Denies cough, dyspnea at rest,wheeezing Breast: no concerns about lumps GI: Denies nausea, vomiting, diarrhea, constipation, change in bowel habits, abdominal pain, melena, hematochezia GU: Denies dysuria, hematuria, urinary hesitancy, nocturia, denies STD risk, no concerns about discharge Musculoskeletal: Denies back pain, joint pain Derm: Denies rash, itching Neuro: Denies  paresthesias, frequent falls, frequent headaches Psych: Denies depression, anxiety Endocrine: Denies cold intolerance, heat intolerance, polydipsia Heme: Denies enlarged lymph nodes Allergy: No hayfever  Physical exam: BP 120/86  Pulse 76  Temp(Src) 98.1 F (36.7 C) (Oral)  Ht 5\' 2"  (1.575 m)  Wt 115 lb 12.8 oz (52.527 kg)  BMI 21.18 kg/m2  General:  Well-developed,well-nourished,in no acute distress; alert,appropriate and cooperative throughout examination Head:  normocephalic and atraumatic.   Eyes:  vision grossly intact, pupils equal, pupils round, and pupils reactive to light.   Ears:  R ear normal and L ear normal.   Nose:  no external deformity.   Mouth:  good dentition.   Neck:  No deformities, masses, or tenderness noted. Breasts:   Bilateral palpable masses, freely movable- per pt has always had them   Lungs:  Normal respiratory effort, chest expands symmetrically. Lungs are clear to auscultation, no crackles or wheezes. Heart:  Normal rate and regular rhythm. S1 and S2 normal without gallop, murmur, click, rub or other extra sounds. Abdomen:  Bowel sounds positive,abdomen soft and non-tender without masses, organomegaly or hernias noted. Rectal:  no external abnormalities.   Genitalia:  Pelvic Exam:        External: normal female genitalia without lesions or masses        Vagina: normal without lesions or masses        Cervix: absent        Adnexa: normal bimanual exam without masses or fullness        Uterus: absent        Pap smear: performed Msk:  No deformity or scoliosis noted of thoracic or lumbar spine.   Extremities:  No clubbing, cyanosis, edema, or deformity noted with normal full range of motion of all joints.   Neurologic:  alert & oriented X3 and gait normal.   Skin:  Intact without suspicious lesions or rashes Cervical Nodes:  No lymphadenopathy noted Axillary Nodes:  No palpable lymphadenopathy Psych:  Cognition and judgment appear intact. Alert and cooperative with normal attention span and concentration. No apparent delusions, illusions, hallucinations

## 2010-06-30 NOTE — Assessment & Plan Note (Signed)
Deteriorated. Start simvastatin 5 mg nightly. See pt instructions.

## 2010-06-30 NOTE — Assessment & Plan Note (Signed)
Reviewed preventive care protocols, scheduled due services, and updated immunizations Discussed nutrition, exercise, diet, and healthy lifestyle.  

## 2010-06-30 NOTE — Patient Instructions (Addendum)
Please stop by to see Kristen Caldwell on your way out. Come back in 8 weeks for follow up lipid panel, hepatic panel (272.4)  Cholesterol Cholesterol is a white, waxy, fat-like protein needed by your body in small amounts. The liver makes all the cholesterol you need. It is carried from the liver by the blood through the blood vessels. Deposits (plaque) may build up on blood vessel walls. This makes the arteries narrower and stiffer. Plaque increases the risk for heart attack and stroke. You cannot feel your cholesterol level even if it is very high. The only way to know is by a blood test to check your lipid (fats) levels. Once you know your cholesterol levels, you should keep a record of the test results. Work with your caregiver to to keep your levels in the desired range. WHAT THE RESULTS MEAN:  Total cholesterol is a rough measure of all the cholesterol in your blood.   LDL is the so-called bad cholesterol. This is the type that deposits cholesterol   in the walls of the arteries. You want this level to be low.   HDL is the good cholesterol because it cleans the arteries and carries the LDL away. You want this level to be high.   Triglycerides are fat that the body can either burn for energy or store. High levels are closely linked to heart disease.  DESIRED LEVELS:  Total cholesterol below 200.   LDL below 100 for people at risk, below 70 for very high risk.   HDL above 50 is good, above 60 is best.   Triglycerides below 150.  HOW TO LOWER YOUR CHOLESTEROL:  Diet.   Choose fish or white meat chicken and [place], roasted or baked. Limit fatty cuts of red meat, fried foods, and processed meats, such as sausage and lunch meat.   Eat lots of fresh fruits and vegetables. Choose whole grains, beans, pasta, potatoes and cereals.   Use only small amounts of olive, corn or canola oils. Avoid butter, mayonnaise, shortening or palm kernel oils. Avoid foods with trans-fats.   Use skim/nonfat milk  and low-fat/nonfat yogurt and cheeses. Avoid whole milk, cream, ice cream, egg yolks and cheeses. Healthy desserts include angel food cake, gingersnaps, animal crackers, hard candy, popsicles, and low-fat/nonfat frozen yogurt. Avoid pastries, cakes, pies and cookies.   Exercise.   A regular program helps decrease LDL and raises HDL.   Helps with weight control.   Do things that increase your activity level like gardening, walking, or taking the stairs.   Medication.   May be prescribed by your caregiver to help lowering cholesterol and the risk for heart disease.   You may need medicine even if your levels are normal if you have several risk factors.  HOME CARE INSTRUCTIONS  Follow your diet and exercise programs as suggested by your caregiver.   Take medications as directed.   Have blood work done when your caregiver feels it is necessary.  MAKE SURE YOU:   Understand these instructions.   Will watch your condition.   Will get help right away if you are not doing well or get worse.  Document Released: 11/23/2000 Document Re-Released: 02/11/2008 [facility] Patient Information 2011 [place], [place].

## 2010-07-01 ENCOUNTER — Telehealth: Payer: Self-pay | Admitting: *Deleted

## 2010-07-01 NOTE — Telephone Encounter (Signed)
Pt was seen yesterday for a physical and was given clobetasol foam for an irritation on her bottom.  She said this medicine is for use on the scalp.  Says she called the pharmacist at [facility] and he told her not to use it.  Please advise.

## 2010-07-02 ENCOUNTER — Other Ambulatory Visit: Payer: Self-pay | Admitting: Family Medicine

## 2010-07-02 MED ORDER — CLOBETASOL PROPIONATE 0.05 % EX OINT: TOPICAL_OINTMENT | Freq: Two times a day (BID) | CUTANEOUS | Status: DC

## 2010-07-02 MED ORDER — CLOBETASOL PROPIONATE 0.05 % EX CREA: TOPICAL_CREAM | Freq: Every day | CUTANEOUS | Status: DC

## 2010-07-02 NOTE — Telephone Encounter (Signed)
Correct Rx called to pharmacy.

## 2010-07-02 NOTE — Telephone Encounter (Signed)
I'm very sorry, it should have been clobetasol cream.  I must have checked the wrong thing on epic. I will send in new rx.

## 2010-07-05 ENCOUNTER — Ambulatory Visit (INDEPENDENT_AMBULATORY_CARE_PROVIDER_SITE_OTHER)
Admission: RE | Admit: 2010-07-05 | Discharge: 2010-07-05 | Disposition: A | Discharge status: Home / Self Care | Payer: BC Managed Care – PPO | Source: Ambulatory Visit

## 2010-07-05 ENCOUNTER — Encounter: Payer: Self-pay | Admitting: Internal Medicine

## 2010-07-05 ENCOUNTER — Ambulatory Visit (INDEPENDENT_AMBULATORY_CARE_PROVIDER_SITE_OTHER): Payer: BC Managed Care – PPO | Admitting: Internal Medicine

## 2010-07-05 ENCOUNTER — Encounter: Payer: Self-pay | Admitting: *Deleted

## 2010-07-05 VITALS — BP 120/80 | HR 72 | Temp 98.0°F | Ht 62.0 in | Wt 116.0 lb

## 2010-07-05 DIAGNOSIS — M810 Age-related osteoporosis without current pathological fracture: Secondary | ICD-10-CM

## 2010-07-05 DIAGNOSIS — N390 Urinary tract infection, site not specified: Secondary | ICD-10-CM

## 2010-07-05 DIAGNOSIS — N3000 Acute cystitis without hematuria: Secondary | ICD-10-CM

## 2010-07-05 DIAGNOSIS — N3090 Cystitis, unspecified without hematuria: Secondary | ICD-10-CM

## 2010-07-05 LAB — POCT URINALYSIS DIPSTICK
Bilirubin, UA: NEGATIVE
Glucose, UA: NEGATIVE
Ketones, UA: NEGATIVE
Leukocytes, UA: NEGATIVE
Nitrite, UA: NEGATIVE
pH, UA: 6

## 2010-07-05 MED ORDER — FLUCONAZOLE 150 MG PO TABS: 150.0000 mg | ORAL_TABLET | Freq: Once | ORAL | Status: AC

## 2010-07-05 MED ORDER — CIPROFLOXACIN HCL 250 MG PO TABS: 250.0000 mg | ORAL_TABLET | Freq: Two times a day (BID) | ORAL | Status: AC

## 2010-07-05 NOTE — Patient Instructions (Addendum)
Please start the cipro. If your urine symptoms are better by tomorrow, you can take it for only 3 days

## 2010-07-05 NOTE — Progress Notes (Signed)
  Subjective:    Patient ID: Kristen Caldwell, female    DOB: Jan 16, 1946, 65 y.o.   MRN: [id number]  HPI Here for physical last week Had noted some increased frequency--but mild Now with burning dysuria Frequency has increased Having urgency as well  Using clobetasol in perineum since 2 days ago--wonders if that could be causing irritation (though worsened before then) Helping the soreness there  No hematuria No fever  Current outpatient prescriptions:acetaminophen (TYLENOL) 500 MG tablet, Take two tablets by mouth as needed for headaches , Disp: , Rfl: ;  amitriptyline (ELAVIL) 10 MG tablet, Take 1 tablet (10 mg total) by mouth at bedtime., Disp: 90 tablet, Rfl: 3;  Calcium Carbonate-Vit D-Min (CALTRATE 600+D PLUS) 600-400 MG-UNIT per tablet, Take 1 tablet by mouth daily.  , Disp: , Rfl:  clobetasol (TEMOVATE) 0.05 % ointment, Apply topically 2 (two) times daily., Disp: 30 g, Rfl: 0;  clorazepate (TRANXENE) 3.75 MG tablet, Take 1 tablet (3.75 mg total) by mouth 2 (two) times daily., Disp: 180 tablet, Rfl: 3;  Multiple Vitamins-Minerals (CENTRUM SILVER) CHEW, Chew 1 tablet by mouth daily.  , Disp: , Rfl: ;  omeprazole (PRILOSEC) 20 MG capsule, Take 1 capsule (20 mg total) by mouth daily., Disp: 90 capsule, Rfl: 3 simvastatin (ZOCOR) 5 MG tablet, Take 1 tablet (5 mg total) by mouth at bedtime., Disp: 30 tablet, Rfl: 11;  DISCONTD: calcium carbonate (TUMS EX) 750 MG chewable tablet, Chew 2 tablets by mouth daily.  , Disp: , Rfl: ;  DISCONTD: chlordiazePOXIDE (LIBRIUM) 5 MG capsule, Take one tablet by mouth 1/2 hour before meals as needed , Disp: , Rfl:  DISCONTD: clidinium-chlordiazePOXIDE (LIBRAX) 2.5-5 MG per capsule, Take 1/2 hour before each meal , Disp: , Rfl: ;  DISCONTD: clobetasol (TEMOVATE) 0.05 % cream, Apply topically at bedtime., Disp: 100 g, Rfl: 0;  DISCONTD: clorazepate (TRANXENE) 3.75 MG tablet, Take 1 tablet (3.75 mg total) by mouth 2 (two) times daily., Disp: 60 tablet,  Rfl: 3  Past Medical History  Diagnosis Date  . Anxiety   . Depression   . GERD (gastroesophageal reflux disease)   . Osteoporosis   . Chronic headaches     Past Surgical History  Procedure Date  . Abdominal hysterectomy 03/14/1989    partial    Family History  Problem Relation Age of Onset  . Stroke Father   . Hypertension Father   . Hypertension Brother     History   Social History  . Marital Status: Married    Spouse Name: N/A    Number of Children: 2  . Years of Education: N/A   Occupational History  . Not on file.   Social History Main Topics  . Smoking status: Never Smoker   . Smokeless tobacco: Not on file  . Alcohol Use: No  . Drug Use: No  . Sexually Active:    Other Topics Concern  . Not on file   Social History Narrative  . No narrative on file   Review of Systems No vomniting or diarrhea Appetite is okay Stable nocturia 2-3/night    Objective:   Physical Exam  Constitutional: She appears well-developed and well-nourished. No distress.  Abdominal: Soft. She exhibits no mass. There is no tenderness.  Musculoskeletal:       No CVA tenderness          Assessment & Plan:

## 2010-07-16 ENCOUNTER — Encounter: Payer: Self-pay | Admitting: Family Medicine

## 2010-07-19 ENCOUNTER — Telehealth: Payer: Self-pay | Admitting: *Deleted

## 2010-07-19 ENCOUNTER — Encounter: Payer: Self-pay | Admitting: Family Medicine

## 2010-07-19 ENCOUNTER — Ambulatory Visit (INDEPENDENT_AMBULATORY_CARE_PROVIDER_SITE_OTHER): Payer: BC Managed Care – PPO | Admitting: Family Medicine

## 2010-07-19 VITALS — BP 120/84 | HR 68 | Temp 98.2°F | Ht 62.0 in | Wt 115.1 lb

## 2010-07-19 DIAGNOSIS — M810 Age-related osteoporosis without current pathological fracture: Secondary | ICD-10-CM

## 2010-07-19 DIAGNOSIS — R21 Rash and other nonspecific skin eruption: Secondary | ICD-10-CM

## 2010-07-19 LAB — POCT URINALYSIS DIPSTICK
Bilirubin, UA: NEGATIVE
Blood, UA: NEGATIVE
Nitrite, UA: NEGATIVE
Spec Grav, UA: 1.005
pH, UA: 5

## 2010-07-19 MED ORDER — RALOXIFENE HCL 60 MG PO TABS: 60.0000 mg | ORAL_TABLET | Freq: Every day | ORAL | Status: DC

## 2010-07-19 MED ORDER — NYSTATIN 100000 UNIT/GM EX POWD: CUTANEOUS | Status: DC

## 2010-07-19 NOTE — Assessment & Plan Note (Signed)
Deteriorated. Will restart Evista. Pt will be on medicare next year and would like to try Prolia at that time.

## 2010-07-19 NOTE — Telephone Encounter (Signed)
Patient advised as instructed to continue taking Caltrate.

## 2010-07-19 NOTE — Progress Notes (Signed)
65 yo here to discuss bone density results. Results scanned into Epic EMR.  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    Was on Evista years ago and could tolerate that. Could not tolerate Actonel- worsened her reflux symptoms and nausea.    Treated with cipro for presumed UTI last week.  UA unremarkable. Still having irritation of her bottom.    The PMH, PSH, Social History, Family History, Medications, and allergies have been reviewed in [facility], and have been updated if relevant.  ROS: General: Denies fever, chills, sweats. No significant weight loss. Allergy: No hayfever  Physical exam:  General:  Well-developed,well-nourished,in no acute distress; alert,appropriate and cooperative throughout examination GU:  vulvular erythema resolved, still has erythema and peeling around her rectum, ?satellite lesions. Psych:  Cognition and judgment appear intact. Alert and cooperative with normal attention span and concentration. No apparent delusions, illusions, hallucinations

## 2010-07-19 NOTE — Telephone Encounter (Signed)
Patient was in earlier today and was given a rx for Evista. Patient got home and started reading the handout from the pharmacy regarding this. Patient wants to know if she is to continue her Caltrate 600 plus D 400 mg, twice daily.  Please advise.

## 2010-07-19 NOTE — Telephone Encounter (Signed)
Yes please do continue taking Caltrate.

## 2010-07-19 NOTE — Patient Instructions (Signed)
Apply the nystatin powder to your bottom. Apply desitin on top of the powder. Call me in a week or two with an update.

## 2010-07-19 NOTE — Assessment & Plan Note (Signed)
New. ?fungal infection. Will try topical nystatin powder.  See pt instructions for details.

## 2010-07-21 NOTE — Progress Notes (Signed)
Patient advised as instructed via telephone.  She will try the Fish Oil and see how that does.

## 2010-07-21 NOTE — Progress Notes (Signed)
Patient advised as instructed about urine culture results.  She stated that she didn't start having the burning sensation in the vaginal area until she started the Simvastatin.  Also, she is complaining of her back and knee hurting as well since taking Simvastatin.  She wanted to know if she could just take Fish oil daily instead of the Simvastatin, and if so how much Fish oil.  Please advise.

## 2010-07-21 NOTE — Progress Notes (Signed)
This is unlikely due to the simvastatin but if she would like to try fish oil, she can.  Take 2-3 grams of Omega 3 fatty acids.

## 2010-07-26 ENCOUNTER — Telehealth: Payer: Self-pay | Admitting: *Deleted

## 2010-07-26 DIAGNOSIS — L292 Pruritus vulvae: Secondary | ICD-10-CM

## 2010-07-26 NOTE — Telephone Encounter (Signed)
No at this point, we need to refer to GYN as discussed before. I will enter referral.

## 2010-07-26 NOTE — Telephone Encounter (Signed)
Patient advised as instructed via telephone.  She would like the referral and I advised her to wait to hear from [facility] in our office.

## 2010-07-26 NOTE — Telephone Encounter (Signed)
Pt states her bottom is still sore and irritated, burning.  Took cipro and symptoms went away, but she was still on simvastatin and symptoms started back.  The nystatin that she was given last week helped some at first but now it's not helping at all.  She is asking if she needs more cipro.  Uses [facility] [address].

## 2010-07-29 ENCOUNTER — Encounter: Payer: Self-pay | Admitting: Internal Medicine

## 2010-07-29 ENCOUNTER — Ambulatory Visit (INDEPENDENT_AMBULATORY_CARE_PROVIDER_SITE_OTHER): Payer: BC Managed Care – PPO | Admitting: Internal Medicine

## 2010-07-29 DIAGNOSIS — K3189 Other diseases of stomach and duodenum: Secondary | ICD-10-CM

## 2010-07-29 DIAGNOSIS — R143 Flatulence: Secondary | ICD-10-CM

## 2010-07-29 DIAGNOSIS — R141 Gas pain: Secondary | ICD-10-CM

## 2010-07-29 DIAGNOSIS — R1013 Epigastric pain: Secondary | ICD-10-CM

## 2010-07-29 MED ORDER — METRONIDAZOLE 250 MG PO TABS: 250.0000 mg | ORAL_TABLET | Freq: Three times a day (TID) | ORAL | Status: AC

## 2010-07-29 MED ORDER — FLUCONAZOLE 150 MG PO TABS: 150.0000 mg | ORAL_TABLET | Freq: Once | ORAL | Status: AC

## 2010-07-29 NOTE — Patient Instructions (Signed)
Take Metronidazole as directed for 1 week. Call us in 2 weeks for update on symptoms.

## 2010-08-02 NOTE — Assessment & Plan Note (Addendum)
This is still ongoing somewhat. Exact cause not clear. Upper GI endoscopy could be undertaken at some point though there does not seem to be any overt clear need given her overall benign situation.

## 2010-08-02 NOTE — Progress Notes (Signed)
  Subjective:    Patient ID: Kristen Caldwell, female    DOB: 20-Sep-1945, 65 y.o.   MRN: [id number]  HPI 65 year old woman last seen by me in 2010 with chronic working and belching problems as well as chest pressure. This is still ongoing. Continues with some pill dysphagia but no food dysphagia. Thinks things may be a bit worse and she started fish oil. She is still anxious at times and has somewhat a stressful life. She is happier in her new job the last time I saw her she had just been laid off and then rehired at her previous employment.    Review of Systems     Objective:   Physical Exam        Assessment & Plan:

## 2010-08-02 NOTE — Assessment & Plan Note (Signed)
It is possible she has bacterial overgrowth given her colonic diverticulosis and a duodenal diverticulum. I wonder if she could have more small bowel diverticula though will not work that up at this point. Empiric trial of metronidazole therapy and she will call back with the results of that trial for treatment of suspected small bowel bacterial overgrowth.

## 2010-08-04 ENCOUNTER — Telehealth: Payer: Self-pay | Admitting: *Deleted

## 2010-08-04 NOTE — Telephone Encounter (Signed)
That's fine.  She can have it done here.

## 2010-08-04 NOTE — Telephone Encounter (Signed)
Pt was referred to a gyn who wants her vitamin D level checked.  They have given her an order to take to [facility], but this will be too expensive for her.  She wants to come here to have this done.  Please advise.

## 2010-08-04 NOTE — Telephone Encounter (Signed)
Patient advised as instructed via telephone.  She will come by on Friday to get labs done.

## 2010-08-06 ENCOUNTER — Other Ambulatory Visit (INDEPENDENT_AMBULATORY_CARE_PROVIDER_SITE_OTHER): Payer: BC Managed Care – PPO

## 2010-08-06 DIAGNOSIS — M810 Age-related osteoporosis without current pathological fracture: Secondary | ICD-10-CM

## 2010-08-07 LAB — VITAMIN D 25 HYDROXY (VIT D DEFICIENCY, FRACTURES): Vit D, 25-Hydroxy: 40 ng/mL (ref 30–89)

## 2010-08-30 ENCOUNTER — Other Ambulatory Visit: Payer: BC Managed Care – PPO

## 2010-09-03 ENCOUNTER — Telehealth: Payer: Self-pay | Admitting: Internal Medicine

## 2010-09-03 ENCOUNTER — Encounter: Payer: Self-pay | Admitting: Family Medicine

## 2010-09-03 ENCOUNTER — Ambulatory Visit (INDEPENDENT_AMBULATORY_CARE_PROVIDER_SITE_OTHER): Payer: BC Managed Care – PPO | Admitting: Family Medicine

## 2010-09-03 ENCOUNTER — Ambulatory Visit: Payer: BC Managed Care – PPO | Admitting: Internal Medicine

## 2010-09-03 VITALS — BP 130/90 | HR 76 | Temp 98.0°F | Ht 63.0 in | Wt 114.0 lb

## 2010-09-03 DIAGNOSIS — J329 Chronic sinusitis, unspecified: Secondary | ICD-10-CM

## 2010-09-03 MED ORDER — AZITHROMYCIN 250 MG PO TABS: ORAL_TABLET | ORAL | Status: AC

## 2010-09-03 NOTE — Progress Notes (Signed)
SUBJECTIVE:  Kristen Caldwell is a 65 y.o. female who complains of coryza, congestion, post nasal drip, myalgias, headache and bilateral sinus pain for 10 days. She denies a history of anorexia, chest pain, chills, dizziness and fatigue and denies a history of asthma. Patient denies smoke cigarettes.   Taking Mucinex and Claritin D with some relief of symptoms.  The PMH, PSH, Social History, Family History, Medications, and allergies have been reviewed in [facility], and have been updated if relevant.   OBJECTIVE: BP 130/90  Pulse 76  Temp(Src) 98 F (36.7 C) (Oral)  Ht 5\' 3"  (1.6 m)  Wt 114 lb (51.71 kg)  BMI 20.19 kg/m2  She appears well, vital signs are as noted. Ears normal.  Throat and pharynx normal.  Neck supple. No adenopathy in the neck. Nose is congested. Sinuses non tender. The chest is clear, without wheezes or rales.  ASSESSMENT:  sinusitis  PLAN: Given duration and progression of symptoms, will treat for bacterial sinusitis.  Has PCN allergy, treat with Zpack  Symptomatic therapy suggested: push fluids, rest and return office visit prn if symptoms persist or worsen.  Call or return to clinic prn if these symptoms worsen or fail to improve as anticipated.

## 2010-09-03 NOTE — Telephone Encounter (Signed)
I spoke with the patient the metronidazole did not help her belching.  She notices its worse with certain foods.  She was to call back with her symptoms.  She has a "moldy taste" when she belches.  Dr Leone Payor please advise.

## 2010-09-06 NOTE — Telephone Encounter (Signed)
1) She should write down what foods are bothering her and avoid  2) try simethicone (Gas Ex) 1-2 tablets for the gas, she can try with every meal 3) cosider beano also - follow directions on bottle 4) if this is not helping after a few weeks then schedule follow-up 5) if she does not have gas and flatulence prevention diet mail one

## 2010-09-06 NOTE — Telephone Encounter (Signed)
Left message for patient to call back  

## 2010-09-06 NOTE — Telephone Encounter (Signed)
Patient advised of Dr. Gessner's recommendations 

## 2010-09-10 ENCOUNTER — Telehealth: Payer: Self-pay | Admitting: *Deleted

## 2010-09-10 MED ORDER — IBUPROFEN 800 MG PO TABS: 800.0000 mg | ORAL_TABLET | Freq: Three times a day (TID) | ORAL | Status: DC

## 2010-09-10 NOTE — Telephone Encounter (Signed)
Ibuprofen 800 mg three times daily is really all that I can recommend for the headache.  Hopefully she will continue to improve.   Ok to send this in to her pharmacy.  30 tablets with no refills.

## 2010-09-10 NOTE — Telephone Encounter (Signed)
Left message on machine at home and cell phone advising patient as instructed.  Rx sent to [facility].

## 2010-09-10 NOTE — Telephone Encounter (Signed)
Patient was seen last week for a sinus infection. She says that she is starting to feel some better, but has continued having the headache from the sinus pressure. She says that nothing she take helps with the headache. She is asking if there is something that can be called in for her headache because she can't function with it. Uses [facility] on [address].

## 2010-10-23 ENCOUNTER — Ambulatory Visit: Payer: Self-pay | Admitting: Unknown Physician Specialty

## 2010-11-01 ENCOUNTER — Ambulatory Visit (INDEPENDENT_AMBULATORY_CARE_PROVIDER_SITE_OTHER): Payer: BC Managed Care – PPO | Admitting: Family Medicine

## 2010-11-01 ENCOUNTER — Encounter: Payer: Self-pay | Admitting: Family Medicine

## 2010-11-01 VITALS — BP 150/90 | HR 94 | Temp 98.2°F | Wt 115.8 lb

## 2010-11-01 DIAGNOSIS — R42 Dizziness and giddiness: Secondary | ICD-10-CM | POA: Insufficient documentation

## 2010-11-01 MED ORDER — ONDANSETRON HCL 4 MG PO TABS: 4.0000 mg | ORAL_TABLET | Freq: Three times a day (TID) | ORAL | Status: DC | PRN

## 2010-11-01 MED ORDER — MECLIZINE HCL 25 MG PO TABS: 25.0000 mg | ORAL_TABLET | Freq: Three times a day (TID) | ORAL | Status: DC | PRN

## 2010-11-01 NOTE — Patient Instructions (Signed)
Take Zofran as needed for nausea and meclizine as needed for dizziness and or nausea (Zofran is better for nausea). Please keep appt with headache doctor.

## 2010-11-01 NOTE — Progress Notes (Signed)
Subjective:    Patient ID: Kristen Caldwell, female    DOB: [birth year], 65 y.o.   MRN: [id number]  HPI  65 yo here to follow up ENT visit. We do not yet have the notes to review from Dr. Mikey Bussing office.  Ms. Toni Arthurs went to see Dr. Jenne Campus for dizziness and ?vertigo. Per pt, did some tests to verify she had vertigo and gave her rx for meclizine and phenergan. Phenergan made her itch.  She also stopped taking the Meclzine, taking benadryl which is not helping. Meclzine helped, no adverse reactions.  Per pt, MRI of head was negative an referred to "headache doctor." She has appt with them next week.  Recent sinusitis.  She is here today because she is still nauseated.  Dizziness has improved. HA have resolved.  Patient Active Problem List  Diagnoses  . UNSPEC DISORDER CARBOHYDRATE TRANSPORT&METAB  . Other and unspecified hyperlipidemia  . ANXIETY  . DEPRESSION  . MIGRAINE HEADACHE  . GERD  . DYSPEPSIA&OTHER SPEC DISORDERS FUNCTION STOMACH  . DIVERTICULOSIS-COLON  . OSTEOPOROSIS  . Flatulence, eructation, and gas pain  . CARPAL TUNNEL SYNDROME, HX OF  . Routine general medical examination at a health care facility  . Rash  . Pruritus, vulva   Past Medical History  Diagnosis Date  . Anxiety   . Depression   . GERD (gastroesophageal reflux disease)   . Osteoporosis   . Chronic headaches    Past Surgical History  Procedure Date  . Abdominal hysterectomy 03/14/1989    partial  . Tubal ligation    History  Substance Use Topics  . Smoking status: Never Smoker   . Smokeless tobacco: Not on file  . Alcohol Use: No   Family History  Problem Relation Age of Onset  . Stroke Father   . Hypertension Father   . Hypertension Brother   . Heart attack Brother   . Lupus Sister   . Diabetes Paternal Grandfather   . Breast cancer Paternal Aunt   . Colon cancer Neg Hx   . Aneurysm Mother    Allergies  Allergen Reactions  . Amoxicillin     REACTION: NAUSEA    . Cephalexin     REACTION: NAUSEA AND VOMITING  . Penicillins     REACTION: TOOK TABLETS WITHOUT PROBLEM  . Phenergan Rash   Current Outpatient Prescriptions on File Prior to Visit  Medication Sig Dispense Refill  . acetaminophen (TYLENOL) 500 MG tablet Take two tablets by mouth as needed for headaches       . amitriptyline (ELAVIL) 10 MG tablet Take 1 tablet (10 mg total) by mouth at bedtime.  90 tablet  3  . Calcium Carbonate-Vit D-Min (CALTRATE 600+D PLUS) 600-400 MG-UNIT per tablet Take 1 tablet by mouth daily.        . clorazepate (TRANXENE) 3.75 MG tablet Take 1 tablet (3.75 mg total) by mouth 2 (two) times daily.  180 tablet  3  . estradiol (VAGIFEM) 25 MCG vaginal tablet Place 25 mcg vaginally 2 (two) times a week.        Marland Kitchen ibuprofen (ADVIL,MOTRIN) 800 MG tablet Take 1 tablet (800 mg total) by mouth 3 (three) times daily.  30 tablet  0  . Multiple Vitamins-Minerals (CENTRUM SILVER) CHEW Chew 1 tablet by mouth daily.        . Omega-3 Fatty Acids (FISH OIL PO) Take 1 capsule by mouth 2 (two) times daily.        Marland Kitchen omeprazole (PRILOSEC) 20  MG capsule Take 1 capsule (20 mg total) by mouth daily.  90 capsule  3  . raloxifene (EVISTA) 60 MG tablet Take 1 tablet (60 mg total) by mouth daily.  30 tablet  11  . ESTRADIOL PO Take by mouth. As directed by gyn       . nystatin (MYCOSTATIN) powder Apply to affected area 3 times daily  15 g  0   The PMH, PSH, Social History, Family History, Medications, and allergies have been reviewed in [facility], and have been updated if relevant.   Review of Systems See HPI    Objective:   Physical Exam  BP 150/90  Pulse 94  Temp(Src) 98.2 F (36.8 C) (Oral)  Wt 115 lb 12 oz (52.504 kg) Gen:  Alert, NAD HEENT:  Neg nystagmus, TMs normal bilaterally Resp: CTA bilaterally  Neuro:  Grossly intact, normal gait      Assessment & Plan:   1. Vertigo   New.   Will request records to review from Dr. Mikey Bussing office. Likely vestibular. AGree with  neurology referral. Added phenergan to allergy list, given rx for Zofran. ?if she would benefit from vestibular rehab. Restart Meclizine as needed.

## 2010-11-03 ENCOUNTER — Telehealth: Payer: Self-pay | Admitting: *Deleted

## 2010-11-03 NOTE — Telephone Encounter (Signed)
Patient advised as instructed via telephone. 

## 2010-11-03 NOTE — Telephone Encounter (Signed)
No signs of infection on exam and antibiotic is not helpful. Please keep appointment with ENT and neurology.

## 2010-11-03 NOTE — Telephone Encounter (Signed)
Pt is asking to try an antibiotic for her ear, she is having a lot of pain with this and wants to see if an antibiotic might help.  She is going on a cruise the first of October and wants to be well by then.  She wanted to let you know that she has a lot of nausea with this ear problem as well and can only take one zofran a day because it causes bad indegestion.   Uses [facility] [address].

## 2010-12-27 ENCOUNTER — Other Ambulatory Visit: Payer: Self-pay | Admitting: *Deleted

## 2010-12-27 MED ORDER — CLORAZEPATE DIPOTASSIUM 3.75 MG PO TABS: 3.7500 mg | ORAL_TABLET | Freq: Two times a day (BID) | ORAL | Status: DC

## 2010-12-27 NOTE — Telephone Encounter (Signed)
Phoned request from patient.  [facility]

## 2010-12-28 NOTE — Telephone Encounter (Signed)
Rx called to [facility].

## 2011-01-10 ENCOUNTER — Ambulatory Visit (INDEPENDENT_AMBULATORY_CARE_PROVIDER_SITE_OTHER): Payer: BC Managed Care – PPO | Admitting: Family Medicine

## 2011-01-10 ENCOUNTER — Encounter: Payer: Self-pay | Admitting: Family Medicine

## 2011-01-10 VITALS — BP 140/90 | HR 121 | Temp 98.1°F | Wt 106.0 lb

## 2011-01-10 DIAGNOSIS — J069 Acute upper respiratory infection, unspecified: Secondary | ICD-10-CM

## 2011-01-10 MED ORDER — AZITHROMYCIN 250 MG PO TABS: ORAL_TABLET | ORAL | Status: AC

## 2011-01-10 NOTE — Progress Notes (Signed)
SUBJECTIVE:  Kristen Caldwell is a 65 y.o. female who complains of coryza, congestion, sneezing, sore throat, productive cough, myalgias and headache for 4 days. She denies a history of chest pain, fevers, shortness of breath and vomiting and denies a history of asthma. Patient denies smoke cigarettes. She has been getting trigger point injections and [facility] and afraid to take anything that may interfere with them.   Patient Active Problem List  Diagnoses  . UNSPEC DISORDER CARBOHYDRATE TRANSPORT&METAB  . Other and unspecified hyperlipidemia  . ANXIETY  . DEPRESSION  . MIGRAINE HEADACHE  . GERD  . DYSPEPSIA&OTHER SPEC DISORDERS FUNCTION STOMACH  . DIVERTICULOSIS-COLON  . OSTEOPOROSIS  . Flatulence, eructation, and gas pain  . CARPAL TUNNEL SYNDROME, HX OF  . Routine general medical examination at a health care facility  . Rash  . Pruritus, vulva  . Vertigo   Past Medical History  Diagnosis Date  . Anxiety   . Depression   . GERD (gastroesophageal reflux disease)   . Osteoporosis   . Chronic headaches    Past Surgical History  Procedure Date  . Abdominal hysterectomy 03/14/1989    partial  . Tubal ligation    History  Substance Use Topics  . Smoking status: Never Smoker   . Smokeless tobacco: Not on file  . Alcohol Use: No   Family History  Problem Relation Age of Onset  . Stroke Father   . Hypertension Father   . Hypertension Brother   . Heart attack Brother   . Lupus Sister   . Diabetes Paternal Grandfather   . Breast cancer Paternal Aunt   . Colon cancer Neg Hx   . Aneurysm Mother    Allergies  Allergen Reactions  . Amoxicillin     REACTION: NAUSEA  . Cephalexin     REACTION: NAUSEA AND VOMITING  . Penicillins     REACTION: TOOK TABLETS WITHOUT PROBLEM  . Phenergan Rash   Current Outpatient Prescriptions on File Prior to Visit  Medication Sig Dispense Refill  . amitriptyline (ELAVIL) 10 MG tablet Take 1 tablet (10 mg total)  by mouth at bedtime.  90 tablet  3  . Calcium Carbonate-Vit D-Min (CALTRATE 600+D PLUS) 600-400 MG-UNIT per tablet Take 1 tablet by mouth daily.        . clorazepate (TRANXENE) 3.75 MG tablet Take 1 tablet (3.75 mg total) by mouth 2 (two) times daily.  180 tablet  0  . estradiol (VAGIFEM) 25 MCG vaginal tablet Place 25 mcg vaginally 2 (two) times a week.        . Multiple Vitamins-Minerals (CENTRUM SILVER) CHEW Chew 1 tablet by mouth daily.        . Omega-3 Fatty Acids (FISH OIL PO) Take 1 capsule by mouth 2 (two) times daily.        Marland Kitchen omeprazole (PRILOSEC) 20 MG capsule Take 1 capsule (20 mg total) by mouth daily.  90 capsule  3   The PMH, PSH, Social History, Family History, Medications, and allergies have been reviewed in [facility], and have been updated if relevant.  OBJECTIVE: BP 140/90  Pulse 121  Temp(Src) 98.1 F (36.7 C) (Oral)  Wt 106 lb (48.081 kg)  SpO2 98%  She appears well, vital signs are as noted. Ears normal.  Throat and pharynx normal.  Neck supple. No adenopathy in the neck. Nose is congested. Sinuses non tender. The chest is clear, without wheezes or rales.  ASSESSMENT:  viral upper respiratory illness  PLAN: Symptomatic therapy suggested: push fluids, rest and return office visit prn if symptoms persist or worsen. Lack of antibiotic effectiveness discussed with her. Given rx to fill only if symptoms progress. Call or return to clinic prn if these symptoms worsen or fail to improve as anticipated.

## 2011-01-10 NOTE — Patient Instructions (Signed)
Take antibiotic as directed if your symptoms do not improve over the next day or two.  Drink lots of fluids.  Treat sympotmatically with Mucinex, nasal saline irrigation, and Robitussin. Call if not improving as expected in 5-7 days.

## 2011-01-31 ENCOUNTER — Ambulatory Visit (INDEPENDENT_AMBULATORY_CARE_PROVIDER_SITE_OTHER): Payer: BC Managed Care – PPO | Admitting: *Deleted

## 2011-01-31 ENCOUNTER — Telehealth: Payer: Self-pay | Admitting: *Deleted

## 2011-01-31 DIAGNOSIS — Z23 Encounter for immunization: Secondary | ICD-10-CM

## 2011-01-31 NOTE — Telephone Encounter (Signed)
Patient advised via telephone, coming this afternoon for flu shot.

## 2011-01-31 NOTE — Telephone Encounter (Signed)
Pt states she finished her trigger point injections last Monday and she is asking if ok for her to get a flu shot.

## 2011-01-31 NOTE — Progress Notes (Signed)
Flu vaccine given to patient during nurse visit today.

## 2011-01-31 NOTE — Telephone Encounter (Signed)
Yes

## 2011-03-14 ENCOUNTER — Other Ambulatory Visit: Payer: BC Managed Care – PPO

## 2011-03-18 ENCOUNTER — Other Ambulatory Visit: Payer: BC Managed Care – PPO

## 2011-03-21 ENCOUNTER — Encounter: Payer: BC Managed Care – PPO | Admitting: Family Medicine

## 2011-04-12 ENCOUNTER — Other Ambulatory Visit: Payer: Self-pay | Admitting: Family Medicine

## 2011-04-12 DIAGNOSIS — Z0000 Encounter for general adult medical examination without abnormal findings: Secondary | ICD-10-CM

## 2011-04-12 DIAGNOSIS — E785 Hyperlipidemia, unspecified: Secondary | ICD-10-CM

## 2011-04-18 ENCOUNTER — Other Ambulatory Visit (INDEPENDENT_AMBULATORY_CARE_PROVIDER_SITE_OTHER): Payer: Medicare Other

## 2011-04-18 DIAGNOSIS — E785 Hyperlipidemia, unspecified: Secondary | ICD-10-CM

## 2011-04-18 DIAGNOSIS — Z0000 Encounter for general adult medical examination without abnormal findings: Secondary | ICD-10-CM

## 2011-04-18 LAB — LIPID PANEL
HDL: 54.2 mg/dL (ref >39.00)
Total CHOL/HDL Ratio: 4
Triglycerides: 115 mg/dL (ref 0.0–149.0)
VLDL: 23 mg/dL (ref 0.0–40.0)

## 2011-04-18 LAB — BASIC METABOLIC PANEL
CO2: 27 mEq/L (ref 19–32)
Chloride: 106 mEq/L (ref 96–112)
Potassium: 3.8 mEq/L (ref 3.5–5.1)
Sodium: 139 mEq/L (ref 135–145)

## 2011-04-18 LAB — LDL CHOLESTEROL, DIRECT: Direct LDL: 125 mg/dL

## 2011-04-25 ENCOUNTER — Ambulatory Visit (INDEPENDENT_AMBULATORY_CARE_PROVIDER_SITE_OTHER): Payer: Medicare Other | Admitting: Family Medicine

## 2011-04-25 ENCOUNTER — Encounter: Payer: Self-pay | Admitting: Family Medicine

## 2011-04-25 VITALS — BP 112/72 | HR 64 | Temp 97.9°F | Ht 62.0 in | Wt 103.2 lb

## 2011-04-25 DIAGNOSIS — R300 Dysuria: Secondary | ICD-10-CM

## 2011-04-25 DIAGNOSIS — F3289 Other specified depressive episodes: Secondary | ICD-10-CM

## 2011-04-25 DIAGNOSIS — M810 Age-related osteoporosis without current pathological fracture: Secondary | ICD-10-CM

## 2011-04-25 DIAGNOSIS — F329 Major depressive disorder, single episode, unspecified: Secondary | ICD-10-CM

## 2011-04-25 DIAGNOSIS — Z23 Encounter for immunization: Secondary | ICD-10-CM

## 2011-04-25 DIAGNOSIS — F411 Generalized anxiety disorder: Secondary | ICD-10-CM

## 2011-04-25 DIAGNOSIS — Z0000 Encounter for general adult medical examination without abnormal findings: Secondary | ICD-10-CM

## 2011-04-25 DIAGNOSIS — G43909 Migraine, unspecified, not intractable, without status migrainosus: Secondary | ICD-10-CM

## 2011-04-25 DIAGNOSIS — Z1231 Encounter for screening mammogram for malignant neoplasm of breast: Secondary | ICD-10-CM

## 2011-04-25 LAB — POCT URINALYSIS DIPSTICK
Blood, UA: NEGATIVE
Nitrite, UA: NEGATIVE
Spec Grav, UA: 1.01
Urobilinogen, UA: 0.2
pH, UA: 6.5

## 2011-04-25 MED ORDER — CLORAZEPATE DIPOTASSIUM 3.75 MG PO TABS: 3.7500 mg | ORAL_TABLET | Freq: Two times a day (BID) | ORAL | Status: DC

## 2011-04-25 MED ORDER — AMITRIPTYLINE HCL 25 MG PO TABS: 25.0000 mg | ORAL_TABLET | Freq: Every day | ORAL | Status: DC

## 2011-04-25 MED ORDER — ESTRADIOL 0.5 MG PO TABS: 0.5000 mg | ORAL_TABLET | Freq: Every day | ORAL | Status: DC

## 2011-04-25 NOTE — Patient Instructions (Addendum)
Good to see you. Please stop by to see Kristen Caldwell on your way out to set up your mammogram. Let's stop taking topamax and start taking Elavil (amitryptiline) 25 mg nightly.

## 2011-04-25 NOTE — Progress Notes (Signed)
Addended by: Gilmer Mor on: 04/25/2011 09:31 AM   Modules accepted: Orders

## 2011-04-25 NOTE — Progress Notes (Signed)
66 yo here for Welcome to Medicare Physical.  I have personally reviewed the Medicare Annual Wellness questionnaire and have noted 1. The patient's medical and social history 2. Their use of alcohol, tobacco or illicit drugs 3. Their current medications and supplements 4. The patient's functional ability including ADL's, fall risks, home safety risks and hearing or visual             impairment. 5. Diet and physical activities 6. Evidence for depression or mood disorders  Migraines- much improved with Topamax and elavil.  Has been seeing Dr. Neale Burly but now that she is on Medicare, she cannot afford to go back to him. SHe has also lost 14 pounds! On topamax, wants to switch to something else (topamax also more expensive for her).    HLD- much improved from last year!  Taking Fish oil. Lab Results  Component Value Date   CHOL 209* 04/18/2011   HDL 54.20 04/18/2011   LDLDIRECT 125.0 04/18/2011   TRIG 115.0 04/18/2011   CHOLHDL 4 04/18/2011     GERD- symptoms improved, does still have some belching.  UTD colonoscopy. UTD DEXA. Normal pap smear 06/2010.  Due for mammogram. Due for pneumovax.  EKG NSR with RSR (V1)  The PMH, PSH, Social History, Family History, Medications, and allergies have been reviewed in [facility], and have been updated if relevant.  ROS: See HPI General: Denies fever, chills, sweats. No significant weight loss. Eyes: Denies blurring,significant itching ENT: Denies earache, sore throat, and hoarseness.  Cardiovascular: Denies chest pains, palpitations, dyspnea on exertion,  Respiratory: Denies cough, dyspnea at rest,wheeezing Breast: no concerns about lumps GI: Denies nausea, vomiting, diarrhea, constipation, change in bowel habits, abdominal pain, melena, hematochezia Musculoskeletal: Denies back pain, joint pain Derm: Denies rash, itching Neuro: Denies  paresthesias, frequent falls, frequent headaches Psych: Denies depression, anxiety Endocrine: Denies cold  intolerance, heat intolerance, polydipsia Heme: Denies enlarged lymph nodes Allergy: No hayfever  Physical exam: BP 112/72  Pulse 64  Temp(Src) 97.9 F (36.6 C) (Oral)  Ht 5\' 2"  (1.575 m)  Wt 103 lb 4 oz (46.834 kg)  BMI 18.88 kg/m2  General:  Well-developed,well-nourished,in no acute distress; alert,appropriate and cooperative throughout examination Head:  normocephalic and atraumatic.   Eyes:  vision grossly intact, pupils equal, pupils round, and pupils reactive to light.   Ears:  R ear normal and L ear normal.   Nose:  no external deformity.   Mouth:  good dentition.   Neck:  No deformities, masses, or tenderness noted. Breasts:  Bilateral palpable masses, freely movable- per pt has always had them   Lungs:  Normal respiratory effort, chest expands symmetrically. Lungs are clear to auscultation, no crackles or wheezes. Heart:  Normal rate and regular rhythm. S1 and S2 normal without gallop, murmur, click, rub or other extra sounds. Abdomen:  Bowel sounds positive,abdomen soft and non-tender without masses, organomegaly or hernias noted, NO CVA tenderness Msk:  No deformity or scoliosis noted of thoracic or lumbar spine.   Extremities:  No clubbing, cyanosis, edema, or deformity noted with normal full range of motion of all joints.   Neurologic:  alert & oriented X3 and gait normal.   Skin:  Intact without suspicious lesions or rashes Cervical Nodes:  No lymphadenopathy noted Axillary Nodes:  No palpable lymphadenopathy Psych:  Cognition and judgment appear intact. Alert and cooperative with normal attention span and concentration. No apparent delusions, illusions, hallucinations  Assessment and Plan: 1. Routine general medical examination at a health care facility  The patients weight, height, BMI and visual acuity have been recorded in the chart I have made referrals, counseling and provided education to the patient based review of the above and I have provided the pt with a  written personalized care plan for preventive services.  EKG Mammogram pneumovax  2. MIGRAINE HEADACHE   Discussed tx options. She is losing too much weight with Topamax. Will d/c topamax and increase elavil to 25 mg nightly.  Pt aware of sedation and other side effects of TCAs (has been taking 10 mg daily). The patient indicates understanding of these issues and agrees with the plan.

## 2011-07-15 ENCOUNTER — Encounter: Payer: Self-pay | Admitting: Family Medicine

## 2011-07-18 ENCOUNTER — Encounter: Payer: Self-pay | Admitting: Family Medicine

## 2011-07-18 ENCOUNTER — Encounter: Payer: Self-pay | Admitting: *Deleted

## 2011-08-03 ENCOUNTER — Other Ambulatory Visit: Payer: Self-pay | Admitting: Family Medicine

## 2011-08-04 NOTE — Telephone Encounter (Signed)
[facility]ene [facility].

## 2011-08-05 ENCOUNTER — Other Ambulatory Visit: Payer: Self-pay

## 2011-08-05 MED ORDER — OMEPRAZOLE 20 MG PO CPDR: 20.0000 mg | DELAYED_RELEASE_CAPSULE | Freq: Every day | ORAL | Status: DC

## 2011-08-05 NOTE — Telephone Encounter (Signed)
Pt request omeprazole 20 mg #90 x 3 to [facility] [address].Pt advised refilled.

## 2011-09-23 ENCOUNTER — Other Ambulatory Visit: Payer: Self-pay

## 2011-09-23 MED ORDER — ESTRADIOL 0.5 MG PO TABS: 0.5000 mg | ORAL_TABLET | Freq: Every day | ORAL | Status: DC

## 2011-09-23 NOTE — Telephone Encounter (Signed)
Pt request 90 day rx for Estradiol # 90 x 1 to [facility] [address].

## 2011-11-14 ENCOUNTER — Other Ambulatory Visit: Payer: Self-pay | Admitting: Family Medicine

## 2011-11-15 ENCOUNTER — Other Ambulatory Visit: Payer: Self-pay | Admitting: Family Medicine

## 2011-11-15 NOTE — Telephone Encounter (Signed)
Medicine [facility] to [facility]. 

## 2011-12-15 ENCOUNTER — Ambulatory Visit (INDEPENDENT_AMBULATORY_CARE_PROVIDER_SITE_OTHER): Payer: Medicare Other

## 2011-12-15 DIAGNOSIS — Z23 Encounter for immunization: Secondary | ICD-10-CM

## 2011-12-20 ENCOUNTER — Other Ambulatory Visit: Payer: Self-pay | Admitting: Family Medicine

## 2011-12-20 NOTE — Telephone Encounter (Signed)
Medicine [facility] to [facility]. 

## 2012-01-18 ENCOUNTER — Other Ambulatory Visit: Payer: Self-pay | Admitting: Family Medicine

## 2012-01-19 ENCOUNTER — Other Ambulatory Visit: Payer: Self-pay | Admitting: Family Medicine

## 2012-01-19 NOTE — Telephone Encounter (Signed)
Medicine [facility] to [facility]. 

## 2012-02-16 ENCOUNTER — Other Ambulatory Visit: Payer: Self-pay | Admitting: Family Medicine

## 2012-02-17 ENCOUNTER — Other Ambulatory Visit: Payer: Self-pay | Admitting: Family Medicine

## 2012-02-17 NOTE — Telephone Encounter (Signed)
Medicine [facility] to [facility]. 

## 2012-02-17 NOTE — Telephone Encounter (Signed)
Electronic refill request.  Please advise. 

## 2012-02-20 NOTE — Telephone Encounter (Signed)
Rx phoned to pharmacy.  

## 2012-03-17 ENCOUNTER — Other Ambulatory Visit: Payer: Self-pay | Admitting: Family Medicine

## 2012-03-19 ENCOUNTER — Other Ambulatory Visit: Payer: Self-pay | Admitting: Family Medicine

## 2012-03-19 NOTE — Telephone Encounter (Signed)
[facility]ed [facility] 

## 2012-03-20 NOTE — Telephone Encounter (Signed)
Pt called for status of tranxene refill. Brandy at [address] said ready for pick up. Pt advised.

## 2012-03-23 ENCOUNTER — Telehealth: Payer: Self-pay

## 2012-03-23 NOTE — Telephone Encounter (Signed)
Kristen Caldwell with [facility] request status of records requested 03/12/12. Not on [facility] log book as received. Advised to refax.

## 2012-03-28 NOTE — Telephone Encounter (Signed)
Marie with [facility] request status of record release re requested 03/26/12; received 03/26/12 and sent to [facility] 03/27/12. Allow 5-7 business days once [employer] begins to process request. Hilda Lias voiced understanding.

## 2012-04-18 ENCOUNTER — Other Ambulatory Visit: Payer: Self-pay | Admitting: Family Medicine

## 2012-04-19 ENCOUNTER — Encounter (HOSPITAL_COMMUNITY): Payer: Self-pay | Admitting: Emergency Medicine

## 2012-04-19 ENCOUNTER — Other Ambulatory Visit: Payer: Self-pay | Admitting: Family Medicine

## 2012-04-19 DIAGNOSIS — M810 Age-related osteoporosis without current pathological fracture: Secondary | ICD-10-CM | POA: Insufficient documentation

## 2012-04-19 DIAGNOSIS — G8929 Other chronic pain: Secondary | ICD-10-CM | POA: Insufficient documentation

## 2012-04-19 DIAGNOSIS — Z79899 Other long term (current) drug therapy: Secondary | ICD-10-CM | POA: Insufficient documentation

## 2012-04-19 DIAGNOSIS — M6281 Muscle weakness (generalized): Secondary | ICD-10-CM | POA: Insufficient documentation

## 2012-04-19 DIAGNOSIS — K219 Gastro-esophageal reflux disease without esophagitis: Secondary | ICD-10-CM | POA: Insufficient documentation

## 2012-04-19 NOTE — Telephone Encounter (Signed)
Medicine [facility] to [facility]. 

## 2012-05-08 ENCOUNTER — Other Ambulatory Visit: Payer: Self-pay | Admitting: Family Medicine

## 2012-05-08 DIAGNOSIS — Z0000 Encounter for general adult medical examination without abnormal findings: Secondary | ICD-10-CM

## 2012-05-08 DIAGNOSIS — E785 Hyperlipidemia, unspecified: Secondary | ICD-10-CM

## 2012-05-15 ENCOUNTER — Other Ambulatory Visit: Payer: Medicare Other

## 2012-05-17 ENCOUNTER — Other Ambulatory Visit (INDEPENDENT_AMBULATORY_CARE_PROVIDER_SITE_OTHER): Payer: Medicare Other

## 2012-05-17 DIAGNOSIS — E785 Hyperlipidemia, unspecified: Secondary | ICD-10-CM

## 2012-05-17 DIAGNOSIS — Z0000 Encounter for general adult medical examination without abnormal findings: Secondary | ICD-10-CM

## 2012-05-17 LAB — CBC WITH DIFFERENTIAL/PLATELET
Basophils Absolute: 0 10*3/uL (ref 0.0–0.1)
Basophils Relative: 0.6 % (ref 0.0–3.0)
Eosinophils Relative: 5.5 % — ABNORMAL HIGH (ref 0.0–5.0)
HCT: 43.5 % (ref 36.0–46.0)
Hemoglobin: 14.8 g/dL (ref 12.0–15.0)
Lymphocytes Relative: 27.8 % (ref 12.0–46.0)
Lymphs Abs: 1.3 10*3/uL (ref 0.7–4.0)
Monocytes Relative: 7.8 % (ref 3.0–12.0)
Neutro Abs: 2.8 10*3/uL (ref 1.4–7.7)
RBC: 4.82 Mil/uL (ref 3.87–5.11)
RDW: 12.5 % (ref 11.5–14.6)

## 2012-05-17 LAB — COMPREHENSIVE METABOLIC PANEL
ALT: 20 U/L (ref 0–35)
Albumin: 4 g/dL (ref 3.5–5.2)
Alkaline Phosphatase: 69 U/L (ref 39–117)
CO2: 27 mEq/L (ref 19–32)
GFR: 95.19 mL/min (ref >60.00)
Glucose, Bld: 91 mg/dL (ref 70–99)
Potassium: 3.8 mEq/L (ref 3.5–5.1)
Sodium: 138 mEq/L (ref 135–145)
Total Protein: 7.3 g/dL (ref 6.0–8.3)

## 2012-05-17 LAB — LDL CHOLESTEROL, DIRECT: Direct LDL: 122.5 mg/dL

## 2012-05-17 LAB — LIPID PANEL
Cholesterol: 203 mg/dL — ABNORMAL HIGH (ref 0–200)
Total CHOL/HDL Ratio: 3

## 2012-05-22 ENCOUNTER — Ambulatory Visit (INDEPENDENT_AMBULATORY_CARE_PROVIDER_SITE_OTHER): Payer: Medicare Other | Admitting: Family Medicine

## 2012-05-22 ENCOUNTER — Encounter: Payer: Self-pay | Admitting: Family Medicine

## 2012-05-22 VITALS — BP 105/52 | HR 80 | Temp 97.5°F | Ht 62.0 in | Wt 105.0 lb

## 2012-05-22 DIAGNOSIS — Z1231 Encounter for screening mammogram for malignant neoplasm of breast: Secondary | ICD-10-CM

## 2012-05-22 DIAGNOSIS — F329 Major depressive disorder, single episode, unspecified: Secondary | ICD-10-CM

## 2012-05-22 DIAGNOSIS — Z1331 Encounter for screening for depression: Secondary | ICD-10-CM

## 2012-05-22 DIAGNOSIS — K5730 Diverticulosis of large intestine without perforation or abscess without bleeding: Secondary | ICD-10-CM

## 2012-05-22 DIAGNOSIS — Z0000 Encounter for general adult medical examination without abnormal findings: Secondary | ICD-10-CM

## 2012-05-22 DIAGNOSIS — E785 Hyperlipidemia, unspecified: Secondary | ICD-10-CM

## 2012-05-22 MED ORDER — CLORAZEPATE DIPOTASSIUM 3.75 MG PO TABS: ORAL_TABLET | ORAL | Status: DC

## 2012-05-22 MED ORDER — ESTRADIOL 0.5 MG PO TABS: 0.5000 mg | ORAL_TABLET | Freq: Every day | ORAL | Status: DC

## 2012-05-22 MED ORDER — TOPIRAMATE 25 MG PO TABS: 25.0000 mg | ORAL_TABLET | Freq: Two times a day (BID) | ORAL | Status: DC

## 2012-05-22 NOTE — Patient Instructions (Addendum)
Try over the counter biotin. Also try claritin or Zyrtec for your allergy symptoms.  Please schedule an appointment with your eye doctor this year.

## 2012-05-22 NOTE — Progress Notes (Signed)
67 yo here for Annual Medicare Wellness Visit.  I have personally reviewed the Medicare Annual Wellness questionnaire and have noted 1. The patient's medical and social history 2. Their use of alcohol, tobacco or illicit drugs 3. Their current medications and supplements 4. The patient's functional ability including ADL's, fall risks, home safety risks and hearing or visual             impairment. 5. Diet and physical activities 6. Evidence for depression or mood disorders   End of life wishes discussed and updated in Social History.  Migraines- much improved with Topamax and elavil.  Has been seeing Dr. Neale Burly but now that she is on Medicare, she cannot afford to go back to him. She has been having fewer headaches.    HLD-  Taking Fish oil. Lab Results  Component Value Date   CHOL 203* 05/17/2012   HDL 58.20 05/17/2012   LDLDIRECT 122.5 05/17/2012   TRIG 99.0 05/17/2012   CHOLHDL 3 05/17/2012     GERD- symptoms improved, does still have some belching.  UTD colonoscopy.  UTD mammogram. UTD DEXA. Normal pap smear 06/2010, s/p hysterectomy.  Patient Active Problem List  Diagnosis  . UNSPEC DISORDER CARBOHYDRATE TRANSPORT&METAB  . Other and unspecified hyperlipidemia  . ANXIETY  . DEPRESSION  . GERD  . DYSPEPSIA&OTHER SPEC DISORDERS FUNCTION STOMACH  . DIVERTICULOSIS-COLON  . OSTEOPOROSIS  . Flatulence, eructation, and gas pain  . CARPAL TUNNEL SYNDROME, HX OF  . Routine general medical examination at a health care facility   Past Medical History  Diagnosis Date  . Anxiety   . Depression   . GERD (gastroesophageal reflux disease)   . Osteoporosis   . Chronic headaches    Past Surgical History  Procedure Laterality Date  . Abdominal hysterectomy  03/14/1989    partial  . Tubal ligation     History  Substance Use Topics  . Smoking status: Never Smoker   . Smokeless tobacco: Not on file  . Alcohol Use: No   Family History  Problem Relation Age of Onset  . Stroke  Father   . Hypertension Father   . Hypertension Brother   . Heart attack Brother   . Lupus Sister   . Diabetes Paternal Grandfather   . Breast cancer Paternal Aunt   . Colon cancer Neg Hx   . Aneurysm Mother    Allergies  Allergen Reactions  . Amoxicillin     REACTION: NAUSEA  . Cephalexin     REACTION: NAUSEA AND VOMITING  . Penicillins     REACTION: TOOK TABLETS WITHOUT PROBLEM  . Promethazine Hcl Rash   Current Outpatient Prescriptions on File Prior to Visit  Medication Sig Dispense Refill  . amitriptyline (ELAVIL) 25 MG tablet Take 1 tablet (25 mg total) by mouth at bedtime.  30 tablet  6  . Calcium Carbonate-Vit D-Min (CALTRATE 600+D PLUS) 600-400 MG-UNIT per tablet Take 1 tablet by mouth daily.        . clorazepate (TRANXENE) 3.75 MG tablet TAKE ONE TABLET BY MOUTH TWICE DAILY  60 tablet  0  . cyclobenzaprine (FLEXERIL) 10 MG tablet Take 10 mg by mouth 2 (two) times daily as needed.        Marland Kitchen estradiol (ESTRACE) 0.5 MG tablet Take 1 tablet (0.5 mg total) by mouth daily.  90 tablet  1  . Multiple Vitamins-Minerals (CENTRUM SILVER) CHEW Chew 1 tablet by mouth daily.        . Omega-3 Fatty Acids (FISH  OIL PO) Take 1 capsule by mouth 2 (two) times daily.        Marland Kitchen omeprazole (PRILOSEC) 20 MG capsule Take 1 capsule (20 mg total) by mouth daily.  90 capsule  3   No current facility-administered medications on file prior to visit.       The PMH, PSH, Social History, Family History, Medications, and allergies have been reviewed in [facility], and have been updated if relevant.  ROS: See HPI General: Denies fever, chills, sweats. No significant weight loss. Eyes: Denies blurring,significant itching ENT: Denies earache, sore throat, and hoarseness.  Cardiovascular: Denies chest pains, palpitations, dyspnea on exertion,  Respiratory: Denies cough, dyspnea at rest,wheeezing Breast: no concerns about lumps GI: Denies nausea, vomiting, diarrhea, constipation, change in bowel habits,  abdominal pain, melena, hematochezia Musculoskeletal: Denies back pain, joint pain Derm: Denies rash, itching Neuro: Denies  paresthesias, frequent falls, frequent headaches Psych: Denies depression, anxiety Endocrine: Denies cold intolerance, heat intolerance, polydipsia Heme: Denies enlarged lymph nodes Allergy: No hayfever  Physical exam: BP 105/52  Pulse 80  Temp(Src) 97.5 F (36.4 C)  Ht 5\' 2"  (1.575 m)  Wt 105 lb (47.628 kg)  BMI 19.2 kg/m2  General:  Well-developed,well-nourished,in no acute distress; alert,appropriate and cooperative throughout examination Head:  normocephalic and atraumatic.   Eyes:  vision grossly intact, pupils equal, pupils round, and pupils reactive to light.   Ears:  R ear normal and L ear normal.   Nose:  no external deformity.   Mouth:  good dentition.   Lungs:  Normal respiratory effort, chest expands symmetrically. Lungs are clear to auscultation, no crackles or wheezes. Heart:  Normal rate and regular rhythm. S1 and S2 normal without gallop, murmur, click, rub or other extra sounds. Abdomen:  Bowel sounds positive,abdomen soft and non-tender without masses, organomegaly or hernias noted, NO CVA tenderness Msk:  No deformity or scoliosis noted of thoracic or lumbar spine.   Extremities:  No clubbing, cyanosis, edema, or deformity noted with normal full range of motion of all joints.   Neurologic:  alert & oriented X3 and gait normal.   Skin:  Intact without suspicious lesions or rashes Cervical Nodes:  No lymphadenopathy noted Axillary Nodes:  No palpable lymphadenopathy Psych:  Cognition and judgment appear intact. Alert and cooperative with normal attention span and concentration. No apparent delusions, illusions, hallucinations  Assessment and Plan: 1. DEPRESSION Symptoms well controlled. On amiitriptlyine 25 mg daily.    2. DIVERTICULOSIS-COLON No symptoms. Colonoscopy UTD.  3. OSTEOPOROSIS Will be due for bone density this May.   Order entered.   - DG Bone Density; Future  4. Other and unspecified hyperlipidemia Well controlled on Fish oil.    5. Routine general medical examination at a health care facility The patients weight, height, BMI and visual acuity have been recorded in the chart I have made referrals, counseling and provided education to the patient based review of the above and I have provided the pt with a written personalized care plan for preventive services.    6. Other screening mammogram  - MM Digital Screening; Future

## 2012-07-05 ENCOUNTER — Other Ambulatory Visit: Payer: Self-pay | Admitting: Family Medicine

## 2012-07-22 ENCOUNTER — Other Ambulatory Visit: Payer: Self-pay | Admitting: Family Medicine

## 2012-07-23 ENCOUNTER — Other Ambulatory Visit: Payer: Self-pay | Admitting: Family Medicine

## 2012-07-23 NOTE — Telephone Encounter (Signed)
Medicine [facility] to [facility]. 

## 2012-08-10 ENCOUNTER — Encounter: Payer: Self-pay | Admitting: Family Medicine

## 2012-08-10 ENCOUNTER — Ambulatory Visit (INDEPENDENT_AMBULATORY_CARE_PROVIDER_SITE_OTHER): Payer: Medicare Other | Admitting: Family Medicine

## 2012-08-10 VITALS — BP 104/62 | HR 80 | Temp 97.8°F | Wt 105.0 lb

## 2012-08-10 DIAGNOSIS — M7712 Lateral epicondylitis, left elbow: Secondary | ICD-10-CM

## 2012-08-10 DIAGNOSIS — M7710 Lateral epicondylitis, unspecified elbow: Secondary | ICD-10-CM | POA: Insufficient documentation

## 2012-08-10 MED ORDER — MELOXICAM 15 MG PO TABS: 15.0000 mg | ORAL_TABLET | Freq: Every day | ORAL | Status: DC

## 2012-08-10 NOTE — Progress Notes (Signed)
SUBJECTIVE: Kristen Caldwell is a 67 y.o. female here with 2 weeks of left elbow and arm pain right above her elbow.  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 Pain is worse when she lifts her hand and wrist.  No swelling.  No numbness or weakness of LUE.   Prior history of related problems: no prior problems with this area in the past.  Patient Active Problem List   Diagnosis Date Noted  . Lateral epicondylitis 08/10/2012  . Routine general medical examination at a health care facility 06/30/2010  . DYSPEPSIA&OTHER SPEC DISORDERS FUNCTION STOMACH 10/15/2008  . DIVERTICULOSIS-COLON 10/15/2008  . Flatulence, eructation, and gas pain 07/28/2008  . UNSPEC DISORDER CARBOHYDRATE TRANSPORT&METAB 06/09/2008  . Other and unspecified hyperlipidemia 06/09/2008  . ANXIETY 02/21/2007  . DEPRESSION 02/21/2007  . GERD 02/21/2007  . OSTEOPOROSIS 02/21/2007  . CARPAL TUNNEL SYNDROME, HX OF 02/21/2007   Past Medical History  Diagnosis Date  . Anxiety   . Depression   . GERD (gastroesophageal reflux disease)   . Osteoporosis   . Chronic headaches    Past Surgical History  Procedure Laterality Date  . Abdominal hysterectomy  03/14/1989    partial  . Tubal ligation     History  Substance Use Topics  . Smoking status: Never Smoker   . Smokeless tobacco: Not on file  . Alcohol Use: No   Family History  Problem Relation Age of Onset  . Stroke Father   . Hypertension Father   . Hypertension Brother   . Heart attack Brother   . Lupus Sister   . Diabetes Paternal Grandfather   . Breast cancer Paternal Aunt   . Colon cancer Neg Hx   . Aneurysm Mother    Allergies  Allergen Reactions  . Amoxicillin     REACTION: NAUSEA  . Cephalexin     REACTION: NAUSEA AND VOMITING  . Penicillins     REACTION: TOOK TABLETS WITHOUT PROBLEM  . Promethazine Hcl Rash   Current Outpatient Prescriptions on  File Prior to Visit  Medication Sig Dispense Refill  . amitriptyline (ELAVIL) 25 MG tablet TAKE ONE TABLET BY MOUTH AT BEDTIME  30 tablet  0  . Calcium Carbonate-Vit D-Min (CALTRATE 600+D PLUS) 600-400 MG-UNIT per tablet Take 1 tablet by mouth daily.        . clorazepate (TRANXENE) 3.75 MG tablet TAKE ONE TABLET BY MOUTH TWICE DAILY  60 tablet  0  . estradiol (ESTRACE) 0.5 MG tablet Take 1 tablet (0.5 mg total) by mouth daily.  90 tablet  3  . Multiple Vitamins-Minerals (CENTRUM SILVER) CHEW Chew 1 tablet by mouth daily.        . Omega-3 Fatty Acids (FISH OIL PO) Take 1 capsule by mouth 2 (two) times daily.        Marland Kitchen omeprazole (PRILOSEC) 20 MG capsule Take 1 capsule (20 mg total) by mouth daily.  90 capsule  3  . topiramate (TOPAMAX) 25 MG tablet Take 1 tablet (25 mg total) by mouth 2 (two) times daily.       No current facility-administered medications on file prior to visit.   The PMH, PSH, Social History, Family History, Medications, and allergies have been reviewed in [facility], and have been updated if relevant.  OBJECTIVE: BP 104/62  Pulse 80  Temp(Src) 97.8 F (36.6 C)  Wt 105 lb (47.628 kg)  BMI 19.2 kg/m2  Appearance: alert, well appearing,  and in no distress and oriented to person, place, and time. Elbow exam: lateral epicondylar tenderness. Neg empty can, neg arch sign X-ray: not indicated.  ASSESSMENT: elbow epicondylitis PLAN: prescription for NSAID given Discussed supportive exercises from [facility] Ad[facility] and Handout given. Call or return to clinic prn if these symptoms worsen or fail to improve as anticipated. The patient indicates understanding of these issues and agrees with the plan. See AVS.

## 2012-08-10 NOTE — Patient Instructions (Addendum)
Good to see you. Please take Meloxicam- 15 mg daily with food x 2 weeks. Do exercises as directed. Please call me with an update in 2 weeks.

## 2012-08-14 ENCOUNTER — Telehealth: Payer: Self-pay | Admitting: Family Medicine

## 2012-08-14 NOTE — Telephone Encounter (Signed)
Caller: Leanne/Patient; Phone: [phone]; Reason for Call: Kristen Caldwell says she was given a rx for Meloxicam for tennis elbow 08/10/12; says she has attempted to take the med on two separate occasions and it has caused severe nausea; took Zofran; denies vomiting; wonders if she can take something else; please call back at [phone]

## 2012-08-15 NOTE — Telephone Encounter (Signed)
Yes she can take OTC Ibuprofen or Alleve.

## 2012-08-15 NOTE — Telephone Encounter (Signed)
Patient notified as instructed by telephone. 

## 2012-08-21 ENCOUNTER — Other Ambulatory Visit: Payer: Self-pay | Admitting: *Deleted

## 2012-08-21 MED ORDER — CLORAZEPATE DIPOTASSIUM 3.75 MG PO TABS: 3.7500 mg | ORAL_TABLET | Freq: Two times a day (BID) | ORAL | Status: DC

## 2012-08-21 NOTE — Telephone Encounter (Signed)
Medicine [facility] to [facility]. 

## 2012-08-21 NOTE — Telephone Encounter (Signed)
Received faxed refill request from pharmacy. Last office visit 08/10/12. Is it okay to refill medication?

## 2012-09-03 ENCOUNTER — Other Ambulatory Visit: Payer: Self-pay | Admitting: Family Medicine

## 2012-09-03 MED ORDER — AMITRIPTYLINE HCL 25 MG PO TABS: ORAL_TABLET | ORAL | Status: DC

## 2012-09-03 NOTE — Telephone Encounter (Signed)
Fax refill request.

## 2012-09-10 ENCOUNTER — Telehealth: Payer: Self-pay | Admitting: *Deleted

## 2012-09-10 MED ORDER — OMEPRAZOLE 20 MG PO CPDR: 20.0000 mg | DELAYED_RELEASE_CAPSULE | Freq: Every day | ORAL | Status: DC

## 2012-09-10 NOTE — Addendum Note (Signed)
Addended by: Sydell Axon C on: 09/10/2012 01:34 PM   Modules accepted: Orders

## 2012-09-10 NOTE — Telephone Encounter (Signed)
Received faxed refill request electronically from pharmacy. Refill sent electronically to pharmacy.

## 2012-09-10 NOTE — Telephone Encounter (Signed)
Encounter opened in error

## 2012-09-18 ENCOUNTER — Other Ambulatory Visit: Payer: Self-pay | Admitting: *Deleted

## 2012-09-18 MED ORDER — CLORAZEPATE DIPOTASSIUM 3.75 MG PO TABS: 3.7500 mg | ORAL_TABLET | Freq: Two times a day (BID) | ORAL | Status: DC

## 2012-09-18 NOTE — Telephone Encounter (Signed)
Refill called to [facility]. 

## 2012-09-18 NOTE — Telephone Encounter (Signed)
Faxed refill request from [facility] [address], last filled 10/14.

## 2012-10-16 ENCOUNTER — Other Ambulatory Visit: Payer: Self-pay | Admitting: *Deleted

## 2012-10-16 MED ORDER — CLORAZEPATE DIPOTASSIUM 3.75 MG PO TABS: 3.7500 mg | ORAL_TABLET | Freq: Two times a day (BID) | ORAL | Status: DC

## 2012-10-16 NOTE — Telephone Encounter (Signed)
Refill called to [facility]. 

## 2012-10-16 NOTE — Telephone Encounter (Signed)
Faxed request from [facility] [address], last filled 60 on 09/18/12.

## 2012-11-02 ENCOUNTER — Other Ambulatory Visit: Payer: Self-pay

## 2012-11-02 MED ORDER — TOPIRAMATE 25 MG PO TABS: 25.0000 mg | ORAL_TABLET | Freq: Two times a day (BID) | ORAL | Status: DC

## 2012-11-02 MED ORDER — AMITRIPTYLINE HCL 25 MG PO TABS: ORAL_TABLET | ORAL | Status: DC

## 2012-11-02 NOTE — Telephone Encounter (Signed)
Pt notified refill done

## 2012-11-02 NOTE — Telephone Encounter (Signed)
Pt request 3 month refill amitriptyline and one month refill on topamax to [facility] [address]. Pt request more than one refill if possible. Pt request cb after refilled.

## 2012-11-19 ENCOUNTER — Other Ambulatory Visit: Payer: Self-pay

## 2012-11-19 NOTE — Telephone Encounter (Signed)
Pt request refill clorazepate to [facility] [address]. Pt has 2 pills left, pt said [facility] requested on 11/16/12 but do not find request; pt request cb when filled.

## 2012-11-20 MED ORDER — CLORAZEPATE DIPOTASSIUM 3.75 MG PO TABS: 3.7500 mg | ORAL_TABLET | Freq: Two times a day (BID) | ORAL | Status: DC

## 2012-11-20 NOTE — Telephone Encounter (Signed)
Refill called to [facility], advised pt via phone.

## 2012-12-10 ENCOUNTER — Other Ambulatory Visit: Payer: Self-pay | Admitting: Family Medicine

## 2012-12-10 DIAGNOSIS — Z1211 Encounter for screening for malignant neoplasm of colon: Secondary | ICD-10-CM

## 2012-12-18 ENCOUNTER — Other Ambulatory Visit: Payer: Self-pay | Admitting: *Deleted

## 2012-12-18 NOTE — Telephone Encounter (Signed)
Ok to refill 

## 2012-12-19 MED ORDER — CLORAZEPATE DIPOTASSIUM 3.75 MG PO TABS: 3.7500 mg | ORAL_TABLET | Freq: Two times a day (BID) | ORAL | Status: DC

## 2012-12-19 NOTE — Telephone Encounter (Signed)
Rx called in as directed.   

## 2012-12-26 ENCOUNTER — Ambulatory Visit (INDEPENDENT_AMBULATORY_CARE_PROVIDER_SITE_OTHER): Payer: Medicare Other

## 2012-12-26 DIAGNOSIS — Z23 Encounter for immunization: Secondary | ICD-10-CM

## 2013-01-16 ENCOUNTER — Other Ambulatory Visit: Payer: Self-pay | Admitting: *Deleted

## 2013-01-16 MED ORDER — CLORAZEPATE DIPOTASSIUM 3.75 MG PO TABS: 3.7500 mg | ORAL_TABLET | Freq: Two times a day (BID) | ORAL | Status: DC

## 2013-01-16 NOTE — Telephone Encounter (Signed)
Px written for call in  In PCP's absence 

## 2013-01-16 NOTE — Telephone Encounter (Signed)
Last filled 12/19/12 

## 2013-01-16 NOTE — Telephone Encounter (Signed)
Rx called in as prescribed 

## 2013-01-31 ENCOUNTER — Other Ambulatory Visit: Payer: Self-pay | Admitting: Family Medicine

## 2013-01-31 MED ORDER — TOPIRAMATE 25 MG PO TABS: 25.0000 mg | ORAL_TABLET | Freq: Two times a day (BID) | ORAL | Status: DC

## 2013-02-12 ENCOUNTER — Other Ambulatory Visit: Payer: Self-pay | Admitting: Family Medicine

## 2013-02-13 NOTE — Telephone Encounter (Signed)
Will refill times on in PCP absence  Px written for call in

## 2013-02-13 NOTE — Telephone Encounter (Signed)
Electronic refill request, PCP out, please advise 

## 2013-02-13 NOTE — Telephone Encounter (Signed)
Rx called in as prescribed 

## 2013-03-15 ENCOUNTER — Telehealth: Payer: Self-pay | Admitting: Family Medicine

## 2013-03-15 ENCOUNTER — Encounter: Payer: Self-pay | Admitting: Family Medicine

## 2013-03-15 ENCOUNTER — Ambulatory Visit (INDEPENDENT_AMBULATORY_CARE_PROVIDER_SITE_OTHER): Payer: Medicare HMO | Admitting: Family Medicine

## 2013-03-15 VITALS — BP 104/62 | HR 88 | Temp 97.5°F | Ht 62.0 in | Wt 109.2 lb

## 2013-03-15 DIAGNOSIS — F411 Generalized anxiety disorder: Secondary | ICD-10-CM

## 2013-03-15 MED ORDER — CLORAZEPATE DIPOTASSIUM 3.75 MG PO TABS: 3.7500 mg | ORAL_TABLET | Freq: Every day | ORAL | Status: DC

## 2013-03-15 NOTE — Progress Notes (Signed)
Pre-visit discussion using our clinic review tool. No additional management support is needed unless otherwise documented below in the visit note.  

## 2013-03-15 NOTE — Telephone Encounter (Signed)
Pt called and wanted give the Mail Order Fax # [phone] for [facility].

## 2013-03-15 NOTE — Progress Notes (Signed)
Subjective:    Patient ID: Kristen Caldwell, female    DOB: 1945-10-15, 68 y.o.   MRN: [id number]  HPI  Very pleasant 68 yo with long history of anxiety here to discuss medications.  Has been on Tranxene for over twenty years (3.75 mg twice daily).  Her new insurance company considers this a tier 4 and she wants to know what her other options would be. She is also supposed to be taking elavil 25 mg daily but cuts it in half because it makes her hungry.  She feels it does help with her anxiety.  No other negative effects.  Denies feeling depressed.  Does have a lot of family stressors currently.  Husband has dementia.  Son is getting divorced.  Really enjoys working- has a lot of friends at work.  Feels she copes "just fine" until she gets home.  No SI or HI.  Sleeping well.  Appetite good.  Wt stable. Wt Readings from Last 3 Encounters:  03/15/13 109 lb 4 oz (49.555 kg)  08/10/12 105 lb (47.628 kg)  05/22/12 105 lb (47.628 kg)   Patient Active Problem List   Diagnosis Date Noted  . DYSPEPSIA&OTHER [place] DISORDERS FUNCTION STOMACH 10/15/2008  . DIVERTICULOSIS-COLON 10/15/2008  . Flatulence, eructation, and gas pain 07/28/2008  . UNSPEC DISORDER CARBOHYDRATE TRANSPORT&METAB 06/09/2008  . Other and unspecified hyperlipidemia 06/09/2008  . ANXIETY 02/21/2007  . DEPRESSION 02/21/2007  . GERD 02/21/2007  . OSTEOPOROSIS 02/21/2007  . CARPAL TUNNEL SYNDROME, HX OF 02/21/2007   Past Medical History  Diagnosis Date  . Anxiety   . Depression   . GERD (gastroesophageal reflux disease)   . Osteoporosis   . Chronic headaches    Past Surgical History  Procedure Laterality Date  . Abdominal hysterectomy  03/14/1989    partial  . Tubal ligation     History  Substance Use Topics  . Smoking status: Never Smoker   . Smokeless tobacco: Not on file  . Alcohol Use: No   Family History  Problem Relation Age of Onset  . Stroke Father   . Hypertension Father   .  Hypertension Brother   . Heart attack Brother   . Lupus Sister   . Diabetes Paternal Grandfather   . Breast cancer Paternal Aunt   . Colon cancer Neg Hx   . Aneurysm Mother    Allergies  Allergen Reactions  . Amoxicillin     REACTION: NAUSEA  . Cephalexin     REACTION: NAUSEA AND VOMITING  . Penicillins     REACTION: TOOK TABLETS WITHOUT PROBLEM  . Promethazine Hcl Rash   Current Outpatient Prescriptions on File Prior to Visit  Medication Sig Dispense Refill  . amitriptyline (ELAVIL) 25 MG tablet TAKE ONE TABLET BY MOUTH AT BEDTIME  90 tablet  0  . Calcium Carbonate-Vit D-Min (CALTRATE 600+D PLUS) 600-400 MG-UNIT per tablet Take 1 tablet by mouth daily.        Marland Kitchen estradiol (ESTRACE) 0.5 MG tablet Take 1 tablet (0.5 mg total) by mouth daily.  90 tablet  3  . ibuprofen (ADVIL,MOTRIN) 200 MG tablet Take two by mouth twice a day      . meloxicam (MOBIC) 15 MG tablet Take 1 tablet (15 mg total) by mouth daily.  30 tablet  2  . Multiple Vitamins-Minerals (CENTRUM SILVER) CHEW Chew 1 tablet by mouth daily.        . Omega-3 Fatty Acids (FISH OIL PO) Take 1 capsule by mouth 2 (two)  times daily.        Marland Kitchen omeprazole (PRILOSEC) 20 MG capsule Take 1 capsule (20 mg total) by mouth daily.  90 capsule  1  . topiramate (TOPAMAX) 25 MG tablet Take 1 tablet (25 mg total) by mouth 2 (two) times daily.  60 tablet  0   No current facility-administered medications on file prior to visit.   The PMH, PSH, Social History, Family History, Medications, and allergies have been reviewed in [facility], and have been updated if relevant.   Review of Systems See HPI    Objective:   Physical Exam  Constitutional: She appears well-developed and well-nourished. No distress.  HENT:  Head: Normocephalic.  Psychiatric: She has a normal mood and affect. Her speech is normal and behavior is normal. Judgment and thought content normal. Cognition and memory are normal.  Less anxious appearing today.   BP 104/62  Pulse  88  Temp(Src) 97.5 F (36.4 C) (Oral)  Ht 5\' 2"  (1.575 m)  Wt 109 lb 4 oz (49.555 kg)  BMI 19.98 kg/m2  SpO2 99%        Assessment & Plan:

## 2013-03-15 NOTE — Patient Instructions (Signed)
Please try to decrease you tranxene to once daily (when you get home from work). If you can tolerate this, it will be cheaper. I want you to call me next week with an update.

## 2013-03-15 NOTE — Assessment & Plan Note (Signed)
>  25 min spent with face to face with patient, >50% counseling and/or coordinating care Explained to Kristen Caldwell that Tranxene is a benzodiazepine which is very addictive and ideally should be weaned down in someone her age. She agreed to try taking only 1 tablet daily (?maybe after she gets home from work).  If she only takes it once a day she feels she could afford it.  Also she would consider weaning off it if this went well. She also agreed to take the full tablet of elavil. Call or return to clinic prn if these symptoms worsen or fail to improve as anticipated. The patient indicates understanding of these issues and agrees with the plan.

## 2013-03-22 ENCOUNTER — Other Ambulatory Visit: Payer: Self-pay

## 2013-03-22 ENCOUNTER — Other Ambulatory Visit: Payer: Self-pay | Admitting: *Deleted

## 2013-03-22 MED ORDER — ESTRADIOL 0.5 MG PO TABS: 0.5000 mg | ORAL_TABLET | Freq: Every day | ORAL | Status: DC

## 2013-03-22 MED ORDER — TOPIRAMATE 25 MG PO TABS: 25.0000 mg | ORAL_TABLET | Freq: Two times a day (BID) | ORAL | Status: DC

## 2013-03-22 MED ORDER — OMEPRAZOLE 20 MG PO CPDR: 20.0000 mg | DELAYED_RELEASE_CAPSULE | Freq: Every day | ORAL | Status: DC

## 2013-03-22 MED ORDER — AMITRIPTYLINE HCL 25 MG PO TABS: ORAL_TABLET | ORAL | Status: DC

## 2013-03-22 NOTE — Telephone Encounter (Deleted)
Erroneous encounter

## 2013-03-22 NOTE — Telephone Encounter (Signed)
Pt was seen 03/15/13 and has gotten information about new mail order pharmacy; pt request 90 day refill to [facility] for amitriptyline,estradiol, omeprazole and topiramate. Pt said she is taking Topiramate 25 mg taking one daily; (pts med list has 1 tab twice a aday) Pt said if prior auth is needed for omeprazole or amitriptyline call [phone]. Pt has CPX scheduled 05/23/13.

## 2013-04-15 ENCOUNTER — Other Ambulatory Visit: Payer: Self-pay | Admitting: *Deleted

## 2013-04-15 ENCOUNTER — Encounter: Payer: Self-pay | Admitting: *Deleted

## 2013-04-15 MED ORDER — CLORAZEPATE DIPOTASSIUM 3.75 MG PO TABS: 3.7500 mg | ORAL_TABLET | Freq: Every day | ORAL | Status: DC

## 2013-04-15 NOTE — Telephone Encounter (Signed)
Encounter opened in error

## 2013-04-15 NOTE — Telephone Encounter (Signed)
Lm on pts vm informing her Rx faxed to requested pharmacy

## 2013-04-15 NOTE — Telephone Encounter (Signed)
Last filled 03/16/13

## 2013-04-26 ENCOUNTER — Other Ambulatory Visit: Payer: Self-pay | Admitting: Family Medicine

## 2013-04-28 MED ORDER — ESTRADIOL 0.5 MG PO TABS: 0.5000 mg | ORAL_TABLET | Freq: Every day | ORAL | Status: DC

## 2013-05-01 ENCOUNTER — Telehealth: Payer: Self-pay | Admitting: Family Medicine

## 2013-05-01 NOTE — Telephone Encounter (Signed)
Pt is calling and [facility] says they have tried to contact Dr. Deborra Medina about prior auth for her medication

## 2013-05-01 NOTE — Telephone Encounter (Signed)
Spoke to pt and informed her that we will not refill medications a month prior to their due date for refill. She will need to contact her pharmacy closer to the date that refills are due

## 2013-05-03 ENCOUNTER — Other Ambulatory Visit: Payer: Self-pay

## 2013-05-03 MED ORDER — TOPIRAMATE 25 MG PO TABS: 25.0000 mg | ORAL_TABLET | Freq: Two times a day (BID) | ORAL | Status: DC

## 2013-05-03 NOTE — Telephone Encounter (Signed)
On 03/22/13 pt requested refill on topiramate 25 mg but said she was taking one daily. Pt only got # 90 from [facility]. Now pt request refill Topiramate 25 mg with instructions take 1 tab two times daily with quantity # 180. Now pt said sometimes she does take med twice a day.Please advise.

## 2013-05-03 NOTE — Telephone Encounter (Signed)
Pt notified topiramate was sent to [facility] as requested.

## 2013-05-13 ENCOUNTER — Other Ambulatory Visit: Payer: Self-pay | Admitting: Family Medicine

## 2013-05-13 ENCOUNTER — Telehealth: Payer: Self-pay

## 2013-05-13 DIAGNOSIS — Z0000 Encounter for general adult medical examination without abnormal findings: Secondary | ICD-10-CM

## 2013-05-13 DIAGNOSIS — E785 Hyperlipidemia, unspecified: Secondary | ICD-10-CM

## 2013-05-13 NOTE — Telephone Encounter (Signed)
Pt left v/m; pt thinks has UTI;symptoms began 2-3 weeks ago; urgency but when voids pt has burning after urinating.last week had rt side pain, no right side pain since 05/10/13,  low back pain on rt side. No frequecy of urine and no fever, no vaginal discharge or itching but pt used monistat to see if that would help but it did not help symptoms.pt only wants to see Dr Deborra Medina and pt scheduled appt with Dr Deborra Medina on 05/15/13 but wants Dr Hulen Shouts advice what to do until seen.Please advise.[facility] [place] request cb.

## 2013-05-13 NOTE — Telephone Encounter (Signed)
Lm on pts vm requesting a call back 

## 2013-05-13 NOTE — Telephone Encounter (Signed)
Drink cranberry juice and ok to take AZO although it can affect the results of her UA.

## 2013-05-14 ENCOUNTER — Other Ambulatory Visit: Payer: Self-pay | Admitting: Family Medicine

## 2013-05-14 MED ORDER — CLORAZEPATE DIPOTASSIUM 3.75 MG PO TABS: 3.7500 mg | ORAL_TABLET | Freq: Every day | ORAL | Status: DC

## 2013-05-14 NOTE — Telephone Encounter (Signed)
Spoke to pt who states that she will drink plenty of cranberry juice and water, as suggested. She is going to hold off on the AZO since she has an appt 03/04 and is not wanting UA results to be affected.

## 2013-05-14 NOTE — Telephone Encounter (Signed)
Spoke to pt and informed her Rx has been faxed to requested pharmacy 

## 2013-05-14 NOTE — Telephone Encounter (Signed)
Received faxed refill request from pharmacy. Last refill 04/15/13 #30, last office visit 03/15/13. Is it okay to refill medication?

## 2013-05-15 ENCOUNTER — Encounter: Payer: Self-pay | Admitting: Family Medicine

## 2013-05-15 ENCOUNTER — Ambulatory Visit (INDEPENDENT_AMBULATORY_CARE_PROVIDER_SITE_OTHER): Payer: Medicare HMO | Admitting: Family Medicine

## 2013-05-15 VITALS — BP 136/84 | HR 91 | Temp 97.6°F | Wt 108.5 lb

## 2013-05-15 DIAGNOSIS — R300 Dysuria: Secondary | ICD-10-CM

## 2013-05-15 LAB — POCT URINALYSIS DIPSTICK
Bilirubin, UA: NEGATIVE
Blood, UA: NEGATIVE
GLUCOSE UA: NEGATIVE
Ketones, UA: NEGATIVE
LEUKOCYTES UA: NEGATIVE
NITRITE UA: NEGATIVE
Protein, UA: NEGATIVE
SPEC GRAV UA: 1.015
UROBILINOGEN UA: 0.2
pH, UA: 6

## 2013-05-15 MED ORDER — CLOBETASOL PROPIONATE 0.05 % EX CREA: TOPICAL_CREAM | Freq: Every day | CUTANEOUS | Status: DC

## 2013-05-15 NOTE — Assessment & Plan Note (Signed)
UA neg. 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 Declined pelvic exam - has CPX next week. Restart clobetasol and will perform pelvic exam and reassess symptoms next week.

## 2013-05-15 NOTE — Progress Notes (Signed)
  Subjective:    Patient ID: Kristen Caldwell, female    DOB: 02/15/1946, 68 y.o.   MRN: [id number]  Dysuria    2 weeks of dysuria and burning around perineum after urination.  No increased urinary frequency. No back pain. NO n//v  Has used clobetasol in perineum in past for lichen planus.  No hematuria No fever  Current outpatient prescriptions:amitriptyline (ELAVIL) 25 MG tablet, TAKE ONE TABLET BY MOUTH AT BEDTIME, Disp: 90 tablet, Rfl: 0;  Calcium Carbonate-Vit D-Min (CALTRATE 600+D PLUS) 600-400 MG-UNIT per tablet, Take 2 tablets by mouth daily. , Disp: , Rfl: ;  clorazepate (TRANXENE) 3.75 MG tablet, Take 1 tablet (3.75 mg total) by mouth daily., Disp: 30 tablet, Rfl: 0 estradiol (ESTRACE) 0.5 MG tablet, Take 1 tablet (0.5 mg total) by mouth daily., Disp: 90 tablet, Rfl: 0;  Multiple Vitamins-Minerals (CENTRUM SILVER) CHEW, Chew 1 tablet by mouth daily.  , Disp: , Rfl: ;  Omega-3 Fatty Acids (FISH OIL PO), Take 1 capsule by mouth 2 (two) times daily.  , Disp: , Rfl: ;  omeprazole (PRILOSEC) 20 MG capsule, Take 1 capsule (20 mg total) by mouth daily., Disp: 90 capsule, Rfl: 0 topiramate (TOPAMAX) 25 MG tablet, Take 1 tablet (25 mg total) by mouth 2 (two) times daily., Disp: 180 tablet, Rfl: 0;  clobetasol cream (TEMOVATE) 0.05 %, Apply topically at bedtime., Disp: 100 g, Rfl: 0  Past Medical History  Diagnosis Date  . Anxiety   . Depression   . GERD (gastroesophageal reflux disease)   . Osteoporosis   . Chronic headaches     Past Surgical History  Procedure Laterality Date  . Abdominal hysterectomy  03/14/1989    partial  . Tubal ligation      Family History  Problem Relation Age of Onset  . Stroke Father   . Hypertension Father   . Hypertension Brother   . Heart attack Brother   . Lupus Sister   . Diabetes Paternal Grandfather   . Breast cancer Paternal Aunt   . Colon cancer Neg Hx   . Aneurysm Mother     History   Social History  . Marital Status:  Married    Spouse Name: N/A    Number of Children: 2  . Years of Education: N/A   Occupational History  . Not on file.   Social History Main Topics  . Smoking status: Never Smoker   . Smokeless tobacco: Not on file  . Alcohol Use: No  . Drug Use: No  . Sexual Activity:    Other Topics Concern  . Not on file   Social History Narrative   Desires CPR.   Would not want feeding tubes or prolonged life support.            Review of Systems  Genitourinary: Positive for dysuria.   No vomniting or diarrhea Appetite is okay Stable nocturia 2-3/night    Objective:   Physical Exam  Constitutional: She appears well-developed and well-nourished. No distress.  Abdominal: Soft. She exhibits no mass. There is no tenderness.  Musculoskeletal:  No CVA tenderness          Assessment & Plan:

## 2013-05-15 NOTE — Progress Notes (Signed)
Pre visit review using our clinic review tool, if applicable. No additional management support is needed unless otherwise documented below in the visit note. 

## 2013-05-15 NOTE — Patient Instructions (Signed)
Great to see you. Please use the clobetasol at bedtime for the next several days- update me when you are here next week, sooner if your symptoms worsen.

## 2013-05-16 ENCOUNTER — Encounter: Payer: Self-pay | Admitting: *Deleted

## 2013-05-17 ENCOUNTER — Other Ambulatory Visit (INDEPENDENT_AMBULATORY_CARE_PROVIDER_SITE_OTHER): Payer: Medicare HMO

## 2013-05-17 DIAGNOSIS — E785 Hyperlipidemia, unspecified: Secondary | ICD-10-CM

## 2013-05-17 DIAGNOSIS — Z0000 Encounter for general adult medical examination without abnormal findings: Secondary | ICD-10-CM

## 2013-05-17 NOTE — Addendum Note (Signed)
Addended by: Marchia Bond on: 05/17/2013 10:53 AM   Modules accepted: Orders

## 2013-05-18 LAB — COMPREHENSIVE METABOLIC PANEL
ALBUMIN: 4.1 g/dL (ref 3.6–4.8)
ALT: 17 IU/L (ref 0–32)
AST: 20 IU/L (ref 0–40)
Albumin/Globulin Ratio: 1.8 (ref 1.1–2.5)
Alkaline Phosphatase: 67 IU/L (ref 39–117)
BUN/Creatinine Ratio: 18 (ref 11–26)
BUN: 12 mg/dL (ref 8–27)
CO2: 25 mmol/L (ref 18–29)
Calcium: 9.4 mg/dL (ref 8.7–10.3)
Chloride: 103 mmol/L (ref 97–108)
Creatinine, Ser: 0.66 mg/dL (ref 0.57–1.00)
GFR calc Af Amer: 106 mL/min/{1.73_m2} (ref >59)
GFR calc non Af Amer: 92 mL/min/{1.73_m2} (ref >59)
Globulin, Total: 2.3 g/dL (ref 1.5–4.5)
Glucose: 87 mg/dL (ref 65–99)
Potassium: 3.9 mmol/L (ref 3.5–5.2)
Sodium: 142 mmol/L (ref 134–144)
Total Bilirubin: 0.4 mg/dL (ref 0.0–1.2)
Total Protein: 6.4 g/dL (ref 6.0–8.5)

## 2013-05-18 LAB — CBC WITH DIFFERENTIAL/PLATELET
BASOS: 1 %
Basophils Absolute: 0 10*3/uL (ref 0.0–0.2)
Eos: 3 %
Eosinophils Absolute: 0.1 10*3/uL (ref 0.0–0.4)
HEMATOCRIT: 41.4 % (ref 34.0–46.6)
Hemoglobin: 13.9 g/dL (ref 11.1–15.9)
IMMATURE GRANS (ABS): 0 10*3/uL (ref 0.0–0.1)
Immature Granulocytes: 0 %
LYMPHS ABS: 1.4 10*3/uL (ref 0.7–3.1)
Lymphs: 34 %
MCH: 29.9 pg (ref 26.6–33.0)
MCHC: 33.6 g/dL (ref 31.5–35.7)
MCV: 89 fL (ref 79–97)
MONOS ABS: 0.3 10*3/uL (ref 0.1–0.9)
Monocytes: 7 %
NEUTROS PCT: 55 %
Neutrophils Absolute: 2.2 10*3/uL (ref 1.4–7.0)
RBC: 4.65 x10E6/uL (ref 3.77–5.28)
RDW: 13.1 % (ref 12.3–15.4)
WBC: 4 10*3/uL (ref 3.4–10.8)

## 2013-05-18 LAB — LIPID PANEL
CHOL/HDL RATIO: 2.8 ratio (ref 0.0–4.4)
Cholesterol, Total: 204 mg/dL — ABNORMAL HIGH (ref 100–199)
HDL: 73 mg/dL (ref >39)
LDL Calculated: 119 mg/dL — ABNORMAL HIGH (ref 0–99)
Triglycerides: 61 mg/dL (ref 0–149)
VLDL CHOLESTEROL CAL: 12 mg/dL (ref 5–40)

## 2013-05-18 LAB — TSH: TSH: 2.33 u[IU]/mL (ref 0.450–4.500)

## 2013-05-23 ENCOUNTER — Ambulatory Visit (INDEPENDENT_AMBULATORY_CARE_PROVIDER_SITE_OTHER): Payer: Medicare HMO | Admitting: Family Medicine

## 2013-05-23 ENCOUNTER — Encounter: Payer: Self-pay | Admitting: Family Medicine

## 2013-05-23 VITALS — BP 102/66 | HR 81 | Temp 97.4°F | Ht 62.0 in | Wt 106.5 lb

## 2013-05-23 DIAGNOSIS — E785 Hyperlipidemia, unspecified: Secondary | ICD-10-CM

## 2013-05-23 DIAGNOSIS — Z1231 Encounter for screening mammogram for malignant neoplasm of breast: Secondary | ICD-10-CM

## 2013-05-23 DIAGNOSIS — M810 Age-related osteoporosis without current pathological fracture: Secondary | ICD-10-CM

## 2013-05-23 DIAGNOSIS — Z79890 Hormone replacement therapy (postmenopausal): Secondary | ICD-10-CM | POA: Insufficient documentation

## 2013-05-23 DIAGNOSIS — Z0000 Encounter for general adult medical examination without abnormal findings: Secondary | ICD-10-CM | POA: Insufficient documentation

## 2013-05-23 MED ORDER — CLOBETASOL PROPIONATE 0.05 % EX OINT: 1.0000 "application " | TOPICAL_OINTMENT | Freq: Two times a day (BID) | CUTANEOUS | Status: DC

## 2013-05-23 NOTE — Progress Notes (Signed)
68 yo here for Annual Medicare Wellness Visit.  I have personally reviewed the Medicare Annual Wellness questionnaire and have noted 1. The patient's medical and social history 2. Their use of alcohol, tobacco or illicit drugs 3. Their current medications and supplements 4. The patient's functional ability including ADL's, fall risks, home safety risks and hearing or visual             impairment. 5. Diet and physical activities 6. Evidence for depression or mood disorders   End of life wishes discussed and updated in Social History.  Remote h/o hysterectomy Mammogram 07/14/2011 Colonoscopy 09/08/2008- recall in 10 years Zostavax 07/30/2009 Pneumovax 04/25/2011 Tdap 02/08/2006  Bone density ordered last year but per pt, was not called about appointment.   HLD-  Taking Fish oil. Lab Results  Component Value Date   CHOL 203* 05/17/2012   HDL 73 05/17/2013   LDLCALC 119* 05/17/2013   LDLDIRECT 122.5 05/17/2012   TRIG 61 05/17/2013   CHOLHDL 2.8 05/17/2013   GERD- symptoms improved, does still have some belching.  Patient Active Problem List   Diagnosis Date Noted  . Dysuria 05/15/2013  . DYSPEPSIA&OTHER [place] DISORDERS FUNCTION STOMACH 10/15/2008  . DIVERTICULOSIS-COLON 10/15/2008  . Flatulence, eructation, and gas pain 07/28/2008  . UNSPEC DISORDER CARBOHYDRATE TRANSPORT&METAB 06/09/2008  . Other and unspecified hyperlipidemia 06/09/2008  . ANXIETY 02/21/2007  . DEPRESSION 02/21/2007  . GERD 02/21/2007  . OSTEOPOROSIS 02/21/2007  . CARPAL TUNNEL SYNDROME, HX OF 02/21/2007   Past Medical History  Diagnosis Date  . Anxiety   . Depression   . GERD (gastroesophageal reflux disease)   . Osteoporosis   . Chronic headaches    Past Surgical History  Procedure Laterality Date  . Abdominal hysterectomy  03/14/1989    partial  . Tubal ligation     History  Substance Use Topics  . Smoking status: Never Smoker   . Smokeless tobacco: Not on file  . Alcohol Use: No   Family History   Problem Relation Age of Onset  . Stroke Father   . Hypertension Father   . Hypertension Brother   . Heart attack Brother   . Lupus Sister   . Diabetes Paternal Grandfather   . Breast cancer Paternal Aunt   . Colon cancer Neg Hx   . Aneurysm Mother    Allergies  Allergen Reactions  . Amoxicillin     REACTION: NAUSEA  . Cephalexin     REACTION: NAUSEA AND VOMITING  . Penicillins     REACTION: TOOK TABLETS WITHOUT PROBLEM  . Promethazine Hcl Rash   Current Outpatient Prescriptions on File Prior to Visit  Medication Sig Dispense Refill  . amitriptyline (ELAVIL) 25 MG tablet TAKE ONE TABLET BY MOUTH AT BEDTIME  90 tablet  0  . Calcium Carbonate-Vit D-Min (CALTRATE 600+D PLUS) 600-400 MG-UNIT per tablet Take 2 tablets by mouth daily.       . clobetasol cream (TEMOVATE) 0.05 % Apply topically at bedtime.  100 g  0  . clorazepate (TRANXENE) 3.75 MG tablet Take 1 tablet (3.75 mg total) by mouth daily.  30 tablet  0  . estradiol (ESTRACE) 0.5 MG tablet Take 1 tablet (0.5 mg total) by mouth daily.  90 tablet  0  . Multiple Vitamins-Minerals (CENTRUM SILVER) CHEW Chew 1 tablet by mouth daily.        . Omega-3 Fatty Acids (FISH OIL PO) Take 1 capsule by mouth 2 (two) times daily.        Marland Kitchen  omeprazole (PRILOSEC) 20 MG capsule Take 1 capsule (20 mg total) by mouth daily.  90 capsule  0  . topiramate (TOPAMAX) 25 MG tablet Take 1 tablet (25 mg total) by mouth 2 (two) times daily.  180 tablet  0   No current facility-administered medications on file prior to visit.       The PMH, PSH, Social History, Family History, Medications, and allergies have been reviewed in [facility] and have been updated if relevant.  ROS: See HPI General: Denies fever, chills, sweats. No significant weight loss. Eyes: Denies blurring,significant itching ENT: Denies earache, sore throat, and hoarseness.  Cardiovascular: Denies chest pains, palpitations, dyspnea on exertion,  Respiratory: Denies cough, dyspnea at  rest,wheeezing Breast: no concerns about lumps GI: Denies nausea, vomiting, diarrhea, constipation, change in bowel habits, abdominal pain, melena, hematochezia Musculoskeletal: Denies back pain, joint pain Derm: Denies rash, itching Neuro: Denies  paresthesias, frequent falls, frequent headaches Psych: Denies depression, anxiety Endocrine: Denies cold intolerance, heat intolerance, polydipsia Heme: Denies enlarged lymph nodes Allergy: No hayfever  Physical exam: BP 102/66  Pulse 81  Temp(Src) 97.4 F (36.3 C) (Oral)  Ht 5\' 2"  (1.575 m)  Wt 106 lb 8 oz (48.308 kg)  BMI 19.47 kg/m2  SpO2 99%  General:  Well-developed,well-nourished,in no acute distress; alert,appropriate and cooperative throughout examination Head:  normocephalic and atraumatic.   Eyes:  vision grossly intact, pupils equal, pupils round, and pupils reactive to light.   Ears:  R ear normal and L ear normal.   Nose:  no external deformity.   Mouth:  good dentition.   Neck:  No deformities, masses, or tenderness noted. Breasts:  No mass, nodules, thickening, tenderness, bulging, retraction, inflamation, nipple discharge or skin changes noted.   Lungs:  Normal respiratory effort, chest expands symmetrically. Lungs are clear to auscultation, no crackles or wheezes. Heart:  Normal rate and regular rhythm. S1 and S2 normal without gallop, murmur, click, rub or other extra sounds. Abdomen:  Bowel sounds positive,abdomen soft and non-tender without masses, organomegaly or hernias noted. Rectal:  no external abnormalities.   Genitalia:  Pelvic Exam:        External: normal female genitalia without lesions or masses        Vagina: normal without lesions or masses        Cervix: absent        Adnexa: normal bimanual exam without masses or fullness        Uterus: absent  Msk:  No deformity or scoliosis noted of thoracic or lumbar spine.   Extremities:  No clubbing, cyanosis, edema, or deformity noted with normal full range  of motion of all joints.   Neurologic:  alert & oriented X3 and gait normal.   Skin:  Intact without suspicious lesions or rashes Cervical Nodes:  No lymphadenopathy noted Axillary Nodes:  No palpable lymphadenopathy Psych:  Cognition and judgment appear intact. Alert and cooperative with normal attention span and concentration. No apparent delusions, illusions, hallucinations   Assessment and Plan:

## 2013-05-23 NOTE — Assessment & Plan Note (Signed)
Well controlled. No changes. 

## 2013-05-23 NOTE — Assessment & Plan Note (Signed)
The patients weight, height, BMI and visual acuity have been recorded in the chart I have made referrals, counseling and provided education to the patient based review of the above and I have provided the pt with a written personalized care plan for preventive services.  

## 2013-05-23 NOTE — Patient Instructions (Addendum)
Good to see you. Please call [facility] to set up your mammgram.  Please stop by to see Rosaria Ferries on your way out to set up your bone density scan.

## 2013-05-23 NOTE — Progress Notes (Signed)
Pre visit review using our clinic review tool, if applicable. No additional management support is needed unless otherwise documented below in the visit note. 

## 2013-05-23 NOTE — Assessment & Plan Note (Signed)
DEXA ordered last year. Will go ahead and ask Rosaria Ferries to set this up for her.

## 2013-05-23 NOTE — Assessment & Plan Note (Signed)
>  25 min spent with face to face with patient, >50% counseling and/or coordinating care Discussed risks and benefits of HRT.  Pt does not have a uterus.  Ok to continue estrogen alone if she is aware of risks which she is.

## 2013-05-31 ENCOUNTER — Other Ambulatory Visit: Payer: Self-pay | Admitting: Family Medicine

## 2013-05-31 DIAGNOSIS — M810 Age-related osteoporosis without current pathological fracture: Secondary | ICD-10-CM

## 2013-06-07 ENCOUNTER — Encounter: Payer: Self-pay | Admitting: Family Medicine

## 2013-06-17 ENCOUNTER — Other Ambulatory Visit: Payer: Self-pay | Admitting: *Deleted

## 2013-06-17 MED ORDER — CLORAZEPATE DIPOTASSIUM 3.75 MG PO TABS: 3.7500 mg | ORAL_TABLET | Freq: Every day | ORAL | Status: DC

## 2013-06-17 NOTE — Telephone Encounter (Signed)
Faxed refill request. Last OV 05/23/13.  Last filled:  30 tablets with 0 RF on 05/14/2013.  Please advise.

## 2013-06-18 ENCOUNTER — Other Ambulatory Visit: Payer: Self-pay | Admitting: *Deleted

## 2013-06-18 MED ORDER — OMEPRAZOLE 20 MG PO CPDR: 20.0000 mg | DELAYED_RELEASE_CAPSULE | Freq: Every day | ORAL | Status: DC

## 2013-06-18 MED ORDER — ESTRADIOL 0.5 MG PO TABS: 0.5000 mg | ORAL_TABLET | Freq: Every day | ORAL | Status: DC

## 2013-06-18 MED ORDER — AMITRIPTYLINE HCL 25 MG PO TABS: ORAL_TABLET | ORAL | Status: DC

## 2013-06-18 NOTE — Telephone Encounter (Signed)
Last rx written 03/22/13.

## 2013-06-18 NOTE — Telephone Encounter (Signed)
Attempted to contact pt; phone unable to receive calls. Rx has been faxed to requested pharmacy

## 2013-06-24 ENCOUNTER — Encounter: Payer: Self-pay | Admitting: Family Medicine

## 2013-07-15 ENCOUNTER — Other Ambulatory Visit: Payer: Self-pay | Admitting: *Deleted

## 2013-07-15 NOTE — Telephone Encounter (Signed)
Last office visit 05/23/2013.  Last refilled 06/17/2013 for #30.  Ok to refill?

## 2013-07-16 MED ORDER — CLORAZEPATE DIPOTASSIUM 3.75 MG PO TABS
3.7500 mg | ORAL_TABLET | Freq: Every day | ORAL | Status: DC
Start: ? — End: 2013-08-12

## 2013-07-16 NOTE — Telephone Encounter (Signed)
Sign script faxed to pharmacy as instructed.

## 2013-08-01 ENCOUNTER — Other Ambulatory Visit: Payer: Self-pay | Admitting: *Deleted

## 2013-08-01 MED ORDER — TOPIRAMATE 25 MG PO TABS
25.0000 mg | ORAL_TABLET | Freq: Two times a day (BID) | ORAL | Status: DC
Start: 2013-08-01 — End: 2013-08-12

## 2013-08-09 ENCOUNTER — Telehealth: Payer: Self-pay

## 2013-08-09 NOTE — Telephone Encounter (Signed)
Pt left v/m requesting cb about bone density results and wants to verify topiramate was sent to [facility].

## 2013-08-12 ENCOUNTER — Telehealth: Payer: Self-pay | Admitting: *Deleted

## 2013-08-12 MED ORDER — CLORAZEPATE DIPOTASSIUM 3.75 MG PO TABS: 3.7500 mg | ORAL_TABLET | Freq: Every day | ORAL | Status: DC

## 2013-08-12 MED ORDER — TOPIRAMATE 25 MG PO TABS
25.0000 mg | ORAL_TABLET | Freq: Two times a day (BID) | ORAL | Status: DC
Start: 2013-08-12 — End: 2013-11-28

## 2013-08-12 NOTE — Telephone Encounter (Signed)
Pt contacted office and requested medication refill; sent to [facility] as requested

## 2013-08-12 NOTE — Telephone Encounter (Signed)
Spoke to pt and informed her bone density has not been scanned in as of yet. Rx sent to [facility]

## 2013-10-14 ENCOUNTER — Other Ambulatory Visit: Payer: Self-pay | Admitting: *Deleted

## 2013-10-14 MED ORDER — CLORAZEPATE DIPOTASSIUM 3.75 MG PO TABS: 3.7500 mg | ORAL_TABLET | Freq: Every day | ORAL | Status: DC

## 2013-10-14 NOTE — Telephone Encounter (Signed)
Per pharmacy # [id number] was last filled 09/11/13.

## 2013-10-16 NOTE — Telephone Encounter (Signed)
Pt left v/m requesting status of refill. Medication phoned to [address] pharmacy as instructed. Pt notified done.

## 2013-10-28 ENCOUNTER — Encounter: Payer: Self-pay | Admitting: Internal Medicine

## 2013-11-07 ENCOUNTER — Encounter: Payer: Self-pay | Admitting: Family Medicine

## 2013-11-07 ENCOUNTER — Ambulatory Visit (INDEPENDENT_AMBULATORY_CARE_PROVIDER_SITE_OTHER): Payer: Medicare HMO | Admitting: Family Medicine

## 2013-11-07 VITALS — BP 108/68 | HR 87 | Temp 97.4°F | Wt 104.0 lb

## 2013-11-07 DIAGNOSIS — R110 Nausea: Secondary | ICD-10-CM

## 2013-11-07 DIAGNOSIS — Z23 Encounter for immunization: Secondary | ICD-10-CM

## 2013-11-07 DIAGNOSIS — R197 Diarrhea, unspecified: Secondary | ICD-10-CM | POA: Insufficient documentation

## 2013-11-07 DIAGNOSIS — K921 Melena: Secondary | ICD-10-CM | POA: Insufficient documentation

## 2013-11-07 MED ORDER — ONDANSETRON HCL 4 MG PO TABS: 4.0000 mg | ORAL_TABLET | Freq: Three times a day (TID) | ORAL | Status: DC | PRN

## 2013-11-07 NOTE — Patient Instructions (Signed)
Good to see you. Please drink plenty of fluids, ok to take Zofran for nausea.  I would not recommend taking more Immodium.  We will call you with an appointment to see Dr. Cecilie Kicks- ok to cancel if your symptoms resolve.  Keep [place] updated.

## 2013-11-07 NOTE — Progress Notes (Signed)
Pre visit review using our clinic review tool, if applicable. No additional management support is needed unless otherwise documented below in the visit note. 

## 2013-11-07 NOTE — Progress Notes (Signed)
Subjective:   Patient ID: Kristen Caldwell, female    DOB: Mar 30, 1945, 68 y.o.   MRN: [id number]  Kristen Caldwell is a pleasant 68 y.o. year old female who presents to clinic today with Blood In Stools and Diarrhea  on 11/07/2013  HPI: Martin Majestic to dentist last week, had a tooth pulled- wouldn't stop bleeding- she felt she swallowed a lot of blood. Next morning, very watery diarrhea, then bright red, non painful blood per rectum.   Immodium relieved the diarrhea.  Was nauseated the next day but no blood in stool.  Then yesterday, felt bloated and when she wiped her bottom- blood on the toilet paper. No blood in stool yesterday and it was formed BM.  No abdominal pain, fevers or nausea now.  Colonoscopy UTD- 6/8/2010Carlean Purl, 10 year recall.  Appetite fair- has never had a big appetite. Wt Readings from Last 3 Encounters:  11/07/13 104 lb (47.174 kg)  05/23/13 106 lb 8 oz (48.308 kg)  05/15/13 108 lb 8 oz (49.215 kg)   No black stools.  Was taking NSAIDs last week but none since then.  Current Outpatient Prescriptions on File Prior to Visit  Medication Sig Dispense Refill  . amitriptyline (ELAVIL) 25 MG tablet TAKE ONE TABLET BY MOUTH AT BEDTIME  90 tablet  0  . Calcium Carbonate-Vit D-Min (CALTRATE 600+D PLUS) 600-400 MG-UNIT per tablet Take 2 tablets by mouth daily.       . clobetasol ointment (TEMOVATE) 2.59 % Apply 1 application topically 2 (two) times daily.  30 g  0  . clorazepate (TRANXENE) 3.75 MG tablet Take 1 tablet (3.75 mg total) by mouth daily.  90 tablet  0  . estradiol (ESTRACE) 0.5 MG tablet Take 1 tablet (0.5 mg total) by mouth daily.  90 tablet  1  . Multiple Vitamins-Minerals (CENTRUM SILVER) CHEW Chew 1 tablet by mouth daily.        . Omega-3 Fatty Acids (FISH OIL PO) Take 1 capsule by mouth 2 (two) times daily.        Marland Kitchen omeprazole (PRILOSEC) 20 MG capsule Take 1 capsule (20 mg total) by mouth daily.  90 capsule  1  . topiramate (TOPAMAX) 25 MG  tablet Take 1 tablet (25 mg total) by mouth 2 (two) times daily.  180 tablet  0   No current facility-administered medications on file prior to visit.    Allergies  Allergen Reactions  . Amoxicillin     REACTION: NAUSEA  . Cephalexin     REACTION: NAUSEA AND VOMITING  . Penicillins     REACTION: TOOK TABLETS WITHOUT PROBLEM  . Promethazine Hcl Rash    Past Medical History  Diagnosis Date  . Anxiety   . Depression   . GERD (gastroesophageal reflux disease)   . Osteoporosis   . Chronic headaches     Past Surgical History  Procedure Laterality Date  . Abdominal hysterectomy  03/14/1989    partial  . Tubal ligation      Family History  Problem Relation Age of Onset  . Stroke Father   . Hypertension Father   . Hypertension Brother   . Heart attack Brother   . Lupus Sister   . Diabetes Paternal Grandfather   . Breast cancer Paternal Aunt   . Colon cancer Neg Hx   . Aneurysm Mother     History   Social History  . Marital Status: Married    Spouse Name: N/A    Number  of Children: 2  . Years of Education: N/A   Occupational History  . Not on file.   Social History Main Topics  . Smoking status: Never Smoker   . Smokeless tobacco: Not on file  . Alcohol Use: No  . Drug Use: No  . Sexual Activity:    Other Topics Concern  . Not on file   Social History Narrative   Desires CPR.   Would not want feeding tubes or prolonged life support.            The PMH, PSH, Social History, Family History, Medications, and allergies have been reviewed in [facility], and have been updated if relevant.   Review of Systems    See HPI No fever No chills No abdominal pain No vomiting No sick contact No recent travel No pain with defecation  Objective:    BP 108/68  Pulse 87  Temp(Src) 97.4 F (36.3 C) (Oral)  Wt 104 lb (47.174 kg)  SpO2 98%   Physical Exam Gen:  Thin pleasant female in NAD Abd:  Soft, NT, pos BS No rebound or guarding Rectal exam:  Normal  tone, no external hemorrhoid, no fissure palpated Hemocult neg Ext:  No edema Skin:  Normal tone and no rashes      Assessment & Plan:   Blood in stool  Diarrhea  Need for prophylactic vaccination and inoculation against influenza - Plan: Flu Vaccine QUAD 36+ mos PF IM (Fluarix Quad PF) No Follow-up on file.

## 2013-11-07 NOTE — Assessment & Plan Note (Signed)
New- hemmocult neg today. Etiology unclear to me at this point. Not from external hemorrhoids. Not painful so unlikely a fissure. It is possible that swallowing blood would upset her stomach but I would not expect this to cause bright red blood per rectum. Advised stopping immodium, pushing fluids and will refer to Dr. Carlean Purl for  further evaluation.

## 2013-11-07 NOTE — Assessment & Plan Note (Signed)
New- started to feel nauseated by the end of the visit. Rx given for zofran.

## 2013-11-07 NOTE — Assessment & Plan Note (Signed)
New- resolved.  See above.

## 2013-11-11 ENCOUNTER — Ambulatory Visit (INDEPENDENT_AMBULATORY_CARE_PROVIDER_SITE_OTHER): Payer: Commercial Managed Care - HMO | Admitting: Internal Medicine

## 2013-11-11 ENCOUNTER — Other Ambulatory Visit (INDEPENDENT_AMBULATORY_CARE_PROVIDER_SITE_OTHER): Payer: Commercial Managed Care - HMO

## 2013-11-11 ENCOUNTER — Encounter: Payer: Self-pay | Admitting: Internal Medicine

## 2013-11-11 VITALS — BP 122/90 | HR 88 | Ht 62.0 in | Wt 103.0 lb

## 2013-11-11 DIAGNOSIS — R197 Diarrhea, unspecified: Secondary | ICD-10-CM

## 2013-11-11 DIAGNOSIS — K644 Residual hemorrhoidal skin tags: Secondary | ICD-10-CM

## 2013-11-11 DIAGNOSIS — K921 Melena: Secondary | ICD-10-CM

## 2013-11-11 LAB — CBC WITH DIFFERENTIAL/PLATELET
BASOS PCT: 0.3 % (ref 0.0–3.0)
Basophils Absolute: 0 10*3/uL (ref 0.0–0.1)
EOS PCT: 1.2 % (ref 0.0–5.0)
Eosinophils Absolute: 0.1 10*3/uL (ref 0.0–0.7)
HEMATOCRIT: 43.6 % (ref 36.0–46.0)
HEMOGLOBIN: 14.2 g/dL (ref 12.0–15.0)
LYMPHS ABS: 1.8 10*3/uL (ref 0.7–4.0)
Lymphocytes Relative: 23.8 % (ref 12.0–46.0)
MCHC: 32.7 g/dL (ref 30.0–36.0)
MCV: 92.4 fl (ref 78.0–100.0)
Monocytes Absolute: 0.5 10*3/uL (ref 0.1–1.0)
Monocytes Relative: 6 % (ref 3.0–12.0)
Neutro Abs: 5.3 10*3/uL (ref 1.4–7.7)
Neutrophils Relative %: 68.7 % (ref 43.0–77.0)
Platelets: 212 10*3/uL (ref 150.0–400.0)
RBC: 4.72 Mil/uL (ref 3.87–5.11)
RDW: 12.9 % (ref 11.5–15.5)
WBC: 7.7 10*3/uL (ref 4.0–10.5)

## 2013-11-11 NOTE — Progress Notes (Signed)
   Subjective:    Patient ID: Kristen Caldwell, female    DOB: 1945-12-09, 68 y.o.   MRN: [id number]  HPI Patient is here after diarrhea and bleeding in the stool that started after dental visit. She is somewhat difficult extraction of the tooth. She kept bleeding from the gums over the last week and about a week ago, and think she swallowed blood. She had diarrhea and red blood when wiping for a day or 2. She eventually improved though she was Marcello Moores years. She saw Dr. Deborra Medina last week, was heme-negative. She was sent for evaluation and followup. She is not having any more bleeding. She was nauseous she stopped eating so much she stopped her prunes and she became somewhat constipated. Note she was not given prophylactic antibiotics for dental surgery.  Medications, allergies, past medical history, past surgical history, family history and social history are reviewed and updated in the EMR.  Review of Systems As above    Objective:   Physical Exam Thin NAD abd soft and slightly tender RLQ, no masses  Patti Martinique, CMA present. Rectal: Anoderm inspection revealed no abnormalities Anal wink was present No mass or rectocele present.    Anoscopy was performed with the patient in the left lateral decubitus position while a chaperone was present and revealed small external hemorrhoids all positions (in anal canal)     Assessment & Plan:  Diarrhea Resolved - cause unclear I would've suspected something more like melena if she swallowed a lot of blood. It's possible she was constipated and an unloading episode with numerous stools. She was not given antibiotics to see if doesn't seem to be an issue and she is back to being constipated which is similar to baseline and IBS-like situation.  Blood in stool Transient anal irritation from diarrhea suspected. Small external hemorrhoids on anoscopy today   I appreciate the opportunity to care for this patient. CC: Arnette Norris, MD

## 2013-11-11 NOTE — Assessment & Plan Note (Addendum)
Transient anal irritation from diarrhea suspected. Small external hemorrhoids on anoscopy today Negative colonoscopy except for diverticulosis in 2010. She was heme-negative when Dr. Deborra Medina examined her last week. I don't think further workup is needed. She should resume her prunes.

## 2013-11-11 NOTE — Progress Notes (Signed)
Quick Note:  CBC okay ______

## 2013-11-11 NOTE — Patient Instructions (Addendum)
Your physician has requested that you go to the basement for the following lab work before leaving today: CBC/diff  Continue your prunes.  Call us back if your problems don't resolve.   I appreciate the opportunity to care for you.

## 2013-11-11 NOTE — Assessment & Plan Note (Addendum)
Resolved - cause unclear I would've suspected something more like melena if she swallowed a lot of blood. It's possible she was constipated and an unloading episode with numerous stools. She was not given antibiotics to see if doesn't seem to be an issue and she is back to being constipated which is similar to baseline and IBS-like situation.

## 2013-11-28 ENCOUNTER — Other Ambulatory Visit: Payer: Self-pay | Admitting: *Deleted

## 2013-11-28 MED ORDER — AMITRIPTYLINE HCL 25 MG PO TABS: ORAL_TABLET | ORAL | Status: DC

## 2013-11-28 MED ORDER — TOPIRAMATE 25 MG PO TABS: 25.0000 mg | ORAL_TABLET | Freq: Two times a day (BID) | ORAL | Status: DC

## 2013-12-05 ENCOUNTER — Other Ambulatory Visit: Payer: Self-pay

## 2013-12-05 MED ORDER — ESTRADIOL 0.5 MG PO TABS: 0.5000 mg | ORAL_TABLET | Freq: Every day | ORAL | Status: DC

## 2013-12-05 MED ORDER — OMEPRAZOLE 20 MG PO CPDR: 20.0000 mg | DELAYED_RELEASE_CAPSULE | Freq: Every day | ORAL | Status: DC

## 2013-12-05 NOTE — Telephone Encounter (Signed)
Pt request refill estradiol and omeprazole to [facility].advised done.

## 2014-01-14 ENCOUNTER — Other Ambulatory Visit: Payer: Self-pay

## 2014-01-14 MED ORDER — CLORAZEPATE DIPOTASSIUM 3.75 MG PO TABS: 3.7500 mg | ORAL_TABLET | Freq: Every day | ORAL | Status: DC

## 2014-01-14 NOTE — Telephone Encounter (Signed)
Rx printed and awaiting signature. Will be faxed to requested pharmacy by end of day, 01/15/14

## 2014-01-14 NOTE — Telephone Encounter (Signed)
Pt left v/m requesting refill clorazepate to [facility] [address]. Pt request cb when refilled.Please advise.

## 2014-01-22 ENCOUNTER — Telehealth: Payer: Self-pay | Admitting: Family Medicine

## 2014-01-22 NOTE — Telephone Encounter (Signed)
Patient Information:  Caller Name: [name]  Phone: [phone]  Patient: Kristen Caldwell  Gender: Female  DOB: [birth year]  Age: 68 Years  PCP: Arnette Norris [facility])  Office Follow Up:  Does the office need to follow up with this patient?: No  Instructions For The Office: N/A   Symptoms  Reason For Call & Symptoms: She is hoarse  and has a sore thraot, sinus congestion and a cough.  She has taken Mucinex and Tylenol. SX started 01/19/14.  She cough "a whole lot" last night..  Reviewed Health History In EMR: Yes  Reviewed Medications In EMR: Yes  Reviewed Allergies In EMR: Yes  Reviewed Surgeries / Procedures: Yes  Date of Onset of Symptoms: 01/19/2014  Treatments Tried: Tylenol  and Mucinex  Treatments Tried Worked: No  Guideline(s) Used:  Cough  Disposition Per Guideline:   Home Care  Reason For Disposition Reached:   Cough with cold symptoms (e.g., runny nose, postnasal drip, throat clearing)  Advice Given:  Prevent Dehydration:  Drink adequate liquids.  Prevent Dehydration:  Drink adequate liquids.  This will help soothe an irritated or dry throat and loosen up the phlegm.  Avoid Tobacco Smoke:  Smoking or being exposed to smoke makes coughs much worse.  Reassurance  Coughing is the way that our lungs remove irritants and mucus. It helps protect our lungs from getting pneumonia.  You can get a dry hacking cough after a chest cold. Sometimes this type of cough can last 1-3 weeks, and be worse at night.  Cough Medicines:  OTC Cough Syrups: The most common cough suppressant in OTC cough medications is dextromethorphan. Often the letters "DM" appear in the name.  OTC Cough Drops: Cough drops can help a lot, especially for mild coughs. They reduce coughing by soothing your irritated throat and removing that tickle sensation in the back of the throat. Cough drops also have the advantage of portability - you can carry them with you.  Home Remedy - Hard Candy: Hard  candy works just as well as medicine-flavored OTC cough drops. Diabetics should use sugar-free candy.  Home Remedy - Honey: This old home remedy has been shown to help decrease coughing at night. The adult dosage is 2 teaspoons (10 ml) at bedtime. Honey should not be given to infants under one year of age.  Caution - Dextromethorphan:   Do not try to completely suppress coughs that produce mucus and phlegm. Remember that coughing is helpful in bringing up mucus from the lungs and preventing pneumonia.  Coughing Spasms:  Drink warm fluids. Inhale warm mist (Reason: both relax the airway and loosen up the phlegm).  Prevent Dehydration:  Drink adequate liquids.  This will help soothe an irritated or dry throat and loosen up the phlegm.  Expected Course:   The expected course depends on what is causing the cough.  Viral bronchitis (chest cold) causes a cough that lasts 1 to 3 weeks. Sometimes you may cough up lots of phlegm (sputum, mucus). The mucus can normally be white, gray, yellow, or green.  Call Back If:  Difficulty breathing  Cough lasts more than 3 weeks  Fever lasts > 3 days  You become worse.  Patient Will Follow Care Advice:  YES

## 2014-01-24 ENCOUNTER — Telehealth: Payer: Self-pay | Admitting: Family Medicine

## 2014-01-24 NOTE — Telephone Encounter (Signed)
Spoke to patient to notify her of Dr. Josefine Class comments. Patient verbalized understanding. Patient stated that she just took a shower and washed her hair hoping the stream would help her symptoms. Patient stated that she would go to UC in the morning if she's not feeling any better.

## 2014-01-24 NOTE — Telephone Encounter (Signed)
Patient Information:  Caller Name: Anae  Phone: [phone]  Patient: Kristen Caldwell  Gender: Female  DOB: [birth year]  Age: 68 Years  PCP: Arnette Norris [facility])  Office Follow Up:  Does the office need to follow up with this patient?: Yes  Instructions For The Office: Patient needs further instructions.  RN Note:  Patient would like to know if she should go to an urgent care since there were no openings at [facility] office for this afternoon. Advised she may be able to be seen there tomorrow, but she would like to know this for today. Please contact her with further instructions.  Symptoms  Reason For Call & Symptoms: Productive cough remains with yellow sputum. Also reports runny  nose with yellow drainage. Also reports sinus pressure and pain.  Reviewed Health History In EMR: Yes  Reviewed Medications In EMR: Yes  Reviewed Allergies In EMR: Yes  Reviewed Surgeries / Procedures: Yes  Date of Onset of Symptoms: 01/19/2014  Treatments Tried: Robitussin, Mucinex  Treatments Tried Worked: No  Guideline(s) Used:  Colds  Disposition Per Guideline:   See Today or Tomorrow in Office  Reason For Disposition Reached:   Sinus congestion (pressure, fullness) present > 10 days  Advice Given:  N/A  Patient Will Follow Care Advice:  YES

## 2014-01-24 NOTE — Telephone Encounter (Signed)
UC would be reasonable.  Thanks.

## 2014-04-07 ENCOUNTER — Other Ambulatory Visit: Payer: Self-pay | Admitting: *Deleted

## 2014-04-07 MED ORDER — CLORAZEPATE DIPOTASSIUM 3.75 MG PO TABS: 3.7500 mg | ORAL_TABLET | Freq: Every day | ORAL | Status: DC

## 2014-04-07 NOTE — Telephone Encounter (Signed)
Pt left message on voicemail requesting chlorazepate refill.

## 2014-04-07 NOTE — Telephone Encounter (Signed)
Rx called to pharmacy. Patient advised by telephone.

## 2014-05-29 ENCOUNTER — Ambulatory Visit (INDEPENDENT_AMBULATORY_CARE_PROVIDER_SITE_OTHER): Payer: Commercial Managed Care - HMO | Admitting: Family Medicine

## 2014-05-29 ENCOUNTER — Encounter: Payer: Self-pay | Admitting: Family Medicine

## 2014-05-29 VITALS — BP 120/70 | HR 83 | Temp 97.3°F | Ht 61.75 in | Wt 102.5 lb

## 2014-05-29 DIAGNOSIS — Z23 Encounter for immunization: Secondary | ICD-10-CM | POA: Diagnosis not present

## 2014-05-29 DIAGNOSIS — E785 Hyperlipidemia, unspecified: Secondary | ICD-10-CM

## 2014-05-29 DIAGNOSIS — F32A Depression, unspecified: Secondary | ICD-10-CM

## 2014-05-29 DIAGNOSIS — F329 Major depressive disorder, single episode, unspecified: Secondary | ICD-10-CM

## 2014-05-29 DIAGNOSIS — F411 Generalized anxiety disorder: Secondary | ICD-10-CM | POA: Diagnosis not present

## 2014-05-29 DIAGNOSIS — Z0000 Encounter for general adult medical examination without abnormal findings: Secondary | ICD-10-CM | POA: Diagnosis not present

## 2014-05-29 MED ORDER — TOPIRAMATE 25 MG PO TABS: 25.0000 mg | ORAL_TABLET | Freq: Two times a day (BID) | ORAL | Status: DC

## 2014-05-29 MED ORDER — OMEPRAZOLE 20 MG PO CPDR: 20.0000 mg | DELAYED_RELEASE_CAPSULE | Freq: Every day | ORAL | Status: DC

## 2014-05-29 NOTE — Assessment & Plan Note (Signed)
Reviewed preventive care protocols, scheduled due services, and updated immunizations Discussed nutrition, exercise, diet, and healthy lifestyle.  Prevnar 13 given today. 

## 2014-05-29 NOTE — Addendum Note (Signed)
Addended by: Marchia Bond on: 05/29/2014 09:21 AM   Modules accepted: Orders, SmartSet

## 2014-05-29 NOTE — Assessment & Plan Note (Signed)
Due for labs today. 

## 2014-05-29 NOTE — Addendum Note (Signed)
Addended by: Modena Nunnery on: 05/29/2014 08:45 AM   Modules accepted: Orders

## 2014-05-29 NOTE — Progress Notes (Signed)
Pre visit review using our clinic review tool, if applicable. No additional management support is needed unless otherwise documented below in the visit note. 

## 2014-05-29 NOTE — Assessment & Plan Note (Signed)
Well controlled even with current stressors at home.

## 2014-05-29 NOTE — Patient Instructions (Signed)
Good to see you. Please schedule your mammogram after 06/15/14  We will call you with your lab results and you can view them online.

## 2014-05-29 NOTE — Progress Notes (Signed)
69 yo here for Annual Medicare Wellness Visit.  I have personally reviewed the Medicare Annual Wellness questionnaire and have noted 1. The patient's medical and social history 2. Their use of alcohol, tobacco or illicit drugs 3. Their current medications and supplements 4. The patient's functional ability including ADL's, fall risks, home safety risks and hearing or visual             impairment. 5. Diet and physical activities 6. Evidence for depression or mood disorders   End of life wishes discussed and updated in Social History.  The roster of all physicians providing medical care to patient - is listed in the Snapshot section of the chart.  Overall doing well.  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 Husband has dementia- not severe yet- son is helping.  She feels she is coping ok.  Remote h/o hysterectomy- bimanual exam last  year Mammogram 06/24/13 Colonoscopy 09/08/2008- recall in 10 years- Carlean Purl Zostavax 07/30/2009 Pneumovax 04/25/2011 Tdap 02/08/2006 Influenza vaccine 11/07/13   HLD-  Taking Fish oil.  Due for labs. Lab Results  Component Value Date   CHOL 204* 05/17/2013   HDL 73 05/17/2013   LDLCALC 119* 05/17/2013   LDLDIRECT 122.5 05/17/2012   TRIG 61 05/17/2013   CHOLHDL 2.8 05/17/2013   GERD- symptoms improved, does still have some belching.  Patient Active Problem List   Diagnosis Date Noted  . Medicare annual wellness visit, subsequent 05/23/2013  . Postmenopausal HRT (hormone replacement therapy) 05/23/2013  . HLD (hyperlipidemia) 06/09/2008  . Anxiety state 02/21/2007  . Depression 02/21/2007  . GERD 02/21/2007  . OSTEOPOROSIS 02/21/2007  . CARPAL TUNNEL SYNDROME, HX OF 02/21/2007   Past Medical History  Diagnosis Date  . Anxiety   . Depression   . GERD (gastroesophageal reflux disease)   . Osteoporosis   . Chronic headaches   . Diverticulosis    Past Surgical History  Procedure Laterality  Date  . Abdominal hysterectomy  03/14/1989    partial  . Tubal ligation    . Colonoscopy     History  Substance Use Topics  . Smoking status: Never Smoker   . Smokeless tobacco: Never Used  . Alcohol Use: No   Family History  Problem Relation Age of Onset  . Stroke Father   . Hypertension Father   . Hypertension Brother   . Heart attack Brother   . Lupus Sister   . Diabetes Paternal Grandfather   . Breast cancer Paternal Aunt   . Colon cancer Neg Hx   . Aneurysm Mother    Allergies  Allergen Reactions  . Amoxicillin     REACTION: NAUSEA  . Cephalexin     REACTION: NAUSEA AND VOMITING  . Penicillins     REACTION: TOOK TABLETS WITHOUT PROBLEM  . Promethazine Hcl Rash   Current Outpatient Prescriptions on File Prior to Visit  Medication Sig Dispense Refill  . amitriptyline (ELAVIL) 25 MG tablet TAKE ONE TABLET BY MOUTH AT BEDTIME 90 tablet 0  . Calcium Carbonate-Vit D-Min (CALTRATE 600+D PLUS) 600-400 MG-UNIT per tablet Take 2 tablets by mouth daily.     . clorazepate (TRANXENE) 3.75 MG tablet Take 1 tablet (3.75 mg total) by mouth daily. 90 tablet 0  . estradiol (ESTRACE) 0.5 MG tablet Take 1 tablet (0.5 mg total) by mouth daily. 90 tablet 1  . Multiple Vitamins-Minerals (CENTRUM SILVER) CHEW Chew 1 tablet by mouth daily.      . Omega-3  Fatty Acids (FISH OIL PO) Take 1 capsule by mouth 2 (two) times daily.       No current facility-administered medications on file prior to visit.       The PMH, PSH, Social History, Family History, Medications, and allergies have been reviewed in [facility], and have been updated if relevant.  ROS: See HPI General: Denies fever, chills, sweats. No significant weight loss. Eyes: Denies blurring,significant itching ENT: Denies earache, sore throat, and hoarseness.  Cardiovascular: Denies chest pains, palpitations, dyspnea on exertion,  Respiratory: Denies cough, dyspnea at rest,wheeezing Breast: no concerns about lumps GI: Denies  nausea, vomiting, diarrhea, constipation, change in bowel habits, abdominal pain, melena, hematochezia Musculoskeletal: Denies back pain, joint pain Derm: Denies rash, itching Neuro: Denies  paresthesias, frequent falls, frequent headaches Psych: Denies depression, anxiety Endocrine: Denies cold intolerance, heat intolerance, polydipsia Heme: Denies enlarged lymph nodes Allergy: No hayfever Appetite good- weight stable  Physical exam: BP 120/70 mmHg  Pulse 83  Temp(Src) 97.3 F (36.3 C) (Oral)  Ht 5' 1.75" (1.568 m)  Wt 102 lb 8 oz (46.494 kg)  BMI 18.91 kg/m2  SpO2 98%  Wt Readings from Last 3 Encounters:  05/29/14 102 lb 8 oz (46.494 kg)  11/11/13 103 lb (46.72 kg)  11/07/13 104 lb (47.174 kg)    General:  Well-developed,well-nourished,in no acute distress; alert,appropriate and cooperative throughout examination Head:  normocephalic and atraumatic.   Eyes:  vision grossly intact, pupils equal, pupils round, and pupils reactive to light.   Ears:  R ear normal and L ear normal.   Nose:  no external deformity.   Mouth:  good dentition.   Neck:  No deformities, masses, or tenderness noted. Breasts:  No mass, nodules, thickening, tenderness, bulging, retraction, inflamation, nipple discharge or skin changes noted.   Lungs:  Normal respiratory effort, chest expands symmetrically. Lungs are clear to auscultation, no crackles or wheezes. Heart:  Normal rate and regular rhythm. S1 and S2 normal without gallop, murmur, click, rub or other extra sounds. Abdomen:  Bowel sounds positive,abdomen soft and non-tender without masses, organomegaly or hernias noted. Rectal:  no external abnormalities.   Genitalia:  Pelvic Exam:        External: normal female genitalia without lesions or masses        Vagina: normal without lesions or masses        Cervix: absent        Adnexa: normal bimanual exam without masses or fullness        Uterus: absent Msk:  No deformity or scoliosis noted of  thoracic or lumbar spine.   Extremities:  No clubbing, cyanosis, edema, or deformity noted with normal full range of motion of all joints.   Neurologic:  alert & oriented X3 and gait normal.   Skin:  Intact without suspicious lesions or rashes Cervical Nodes:  No lymphadenopathy noted Axillary Nodes:  No palpable lymphadenopathy Psych:  Cognition and judgment appear intact. Alert and cooperative with normal attention span and concentration. No apparent delusions, illusions, hallucinations

## 2014-05-30 LAB — COMPREHENSIVE METABOLIC PANEL
ALBUMIN: 4.2 g/dL (ref 3.6–4.8)
ALK PHOS: 72 IU/L (ref 39–117)
ALT: 16 IU/L (ref 0–32)
AST: 19 IU/L (ref 0–40)
Albumin/Globulin Ratio: 1.8 (ref 1.1–2.5)
BUN/Creatinine Ratio: 25 (ref 11–26)
BUN: 16 mg/dL (ref 8–27)
Bilirubin Total: 0.3 mg/dL (ref 0.0–1.2)
CALCIUM: 9.4 mg/dL (ref 8.7–10.3)
CO2: 23 mmol/L (ref 18–29)
CREATININE: 0.63 mg/dL (ref 0.57–1.00)
Chloride: 105 mmol/L (ref 97–108)
GFR calc Af Amer: 107 mL/min/{1.73_m2} (ref >59)
GFR calc non Af Amer: 92 mL/min/{1.73_m2} (ref >59)
Globulin, Total: 2.3 g/dL (ref 1.5–4.5)
Glucose: 94 mg/dL (ref 65–99)
Potassium: 4.8 mmol/L (ref 3.5–5.2)
Sodium: 146 mmol/L — ABNORMAL HIGH (ref 134–144)
Total Protein: 6.5 g/dL (ref 6.0–8.5)

## 2014-05-30 LAB — CBC WITH DIFFERENTIAL/PLATELET
BASOS: 1 %
Basophils Absolute: 0 10*3/uL (ref 0.0–0.2)
EOS ABS: 0.1 10*3/uL (ref 0.0–0.4)
Eos: 2 %
HEMATOCRIT: 41.3 % (ref 34.0–46.6)
HEMOGLOBIN: 14.1 g/dL (ref 11.1–15.9)
Immature Grans (Abs): 0 10*3/uL (ref 0.0–0.1)
Immature Granulocytes: 0 %
LYMPHS ABS: 1.1 10*3/uL (ref 0.7–3.1)
LYMPHS: 26 %
MCH: 30.7 pg (ref 26.6–33.0)
MCHC: 34.1 g/dL (ref 31.5–35.7)
MCV: 90 fL (ref 79–97)
Monocytes Absolute: 0.3 10*3/uL (ref 0.1–0.9)
Monocytes: 7 %
NEUTROS ABS: 2.6 10*3/uL (ref 1.4–7.0)
Neutrophils Relative %: 64 %
Platelets: 176 10*3/uL (ref 150–379)
RBC: 4.59 x10E6/uL (ref 3.77–5.28)
RDW: 13.7 % (ref 12.3–15.4)
WBC: 4.1 10*3/uL (ref 3.4–10.8)

## 2014-05-30 LAB — LIPID PANEL
Chol/HDL Ratio: 2.5 ratio units (ref 0.0–4.4)
Cholesterol, Total: 197 mg/dL (ref 100–199)
HDL: 80 mg/dL (ref >39)
LDL Calculated: 106 mg/dL — ABNORMAL HIGH (ref 0–99)
TRIGLYCERIDES: 55 mg/dL (ref 0–149)
VLDL Cholesterol Cal: 11 mg/dL (ref 5–40)

## 2014-05-30 LAB — TSH: TSH: 2.47 u[IU]/mL (ref 0.450–4.500)

## 2014-06-05 ENCOUNTER — Encounter: Payer: Self-pay | Admitting: *Deleted

## 2014-06-21 ENCOUNTER — Other Ambulatory Visit: Payer: Self-pay | Admitting: Family Medicine

## 2014-06-27 ENCOUNTER — Telehealth: Payer: Self-pay | Admitting: *Deleted

## 2014-06-27 NOTE — Telephone Encounter (Signed)
Fax received indicating PA required for pts amitriptyline. Form completed and placed in Dr Elonda Husky box for review and signature

## 2014-07-01 NOTE — Telephone Encounter (Signed)
Form faxed back to requested party;awaiting response

## 2014-07-02 NOTE — Telephone Encounter (Signed)
Approval received for pts medication through 03/14/2015. Copy faxed to pharmacy

## 2014-07-17 ENCOUNTER — Other Ambulatory Visit: Payer: Self-pay | Admitting: *Deleted

## 2014-07-17 NOTE — Telephone Encounter (Signed)
Last f/u appt 05/2014-CPE 

## 2014-07-18 ENCOUNTER — Encounter: Payer: Self-pay | Admitting: Family Medicine

## 2014-07-18 MED ORDER — CLORAZEPATE DIPOTASSIUM 3.75 MG PO TABS: 3.7500 mg | ORAL_TABLET | Freq: Every day | ORAL | Status: DC

## 2014-07-21 MED ORDER — CLORAZEPATE DIPOTASSIUM 3.75 MG PO TABS: 3.7500 mg | ORAL_TABLET | Freq: Every day | ORAL | Status: DC

## 2014-07-21 NOTE — Telephone Encounter (Addendum)
Pt request status of clorazepate refill; advised pt sent to [facility] this AM. Pt will ck with pharmacy later today.

## 2014-07-21 NOTE — Telephone Encounter (Signed)
New Rx sent to [facility] as requested by pt

## 2014-07-28 ENCOUNTER — Encounter: Payer: Self-pay | Admitting: Family Medicine

## 2014-08-15 ENCOUNTER — Encounter: Payer: Self-pay | Admitting: Family Medicine

## 2014-08-18 ENCOUNTER — Other Ambulatory Visit: Payer: Self-pay

## 2014-08-18 MED ORDER — CLORAZEPATE DIPOTASSIUM 3.75 MG PO TABS: 3.7500 mg | ORAL_TABLET | Freq: Every day | ORAL | Status: DC

## 2014-08-18 NOTE — Telephone Encounter (Signed)
Pt left v/m requesting 90 day rx for clorazepate to [facility] [address] pt pays out of pocket with no ins. Until last refill pt had been getting # 90. Last annual exam 05/29/2014.Please advise. The med list of # 30 has not been changed waiting on Dr Hulen Shouts approval.

## 2014-08-18 NOTE — Telephone Encounter (Signed)
Rx called in to requested pharmacy 

## 2014-09-17 ENCOUNTER — Other Ambulatory Visit: Payer: Self-pay | Admitting: *Deleted

## 2014-09-17 MED ORDER — CLORAZEPATE DIPOTASSIUM 3.75 MG PO TABS: 3.7500 mg | ORAL_TABLET | Freq: Every day | ORAL | Status: DC

## 2014-09-17 NOTE — Telephone Encounter (Signed)
Last f/u appt 05/2014-CPE 

## 2014-09-17 NOTE — Telephone Encounter (Signed)
Rx called in to requested pharmacy 

## 2014-10-14 ENCOUNTER — Other Ambulatory Visit: Payer: Self-pay | Admitting: Family Medicine

## 2014-10-17 ENCOUNTER — Other Ambulatory Visit: Payer: Self-pay | Admitting: *Deleted

## 2014-10-17 MED ORDER — CLORAZEPATE DIPOTASSIUM 3.75 MG PO TABS: 3.7500 mg | ORAL_TABLET | Freq: Every day | ORAL | Status: DC

## 2014-10-17 NOTE — Telephone Encounter (Signed)
Last f/u appt 05/2014-CPE 

## 2014-10-17 NOTE — Telephone Encounter (Signed)
Pt request status of tranxene refill; spoke with [facility] [address] and has received refill and will be ready before end of day. Pt will ck with pharmacy.

## 2014-10-17 NOTE — Telephone Encounter (Signed)
Rx called in to requested pharmacy 

## 2014-11-04 ENCOUNTER — Other Ambulatory Visit: Payer: Self-pay | Admitting: Family Medicine

## 2014-11-14 ENCOUNTER — Other Ambulatory Visit: Payer: Self-pay | Admitting: *Deleted

## 2014-11-14 MED ORDER — CLORAZEPATE DIPOTASSIUM 3.75 MG PO TABS: 3.7500 mg | ORAL_TABLET | Freq: Every day | ORAL | Status: DC

## 2014-11-14 NOTE — Telephone Encounter (Signed)
Last f/u appt 05/2014-CPE 

## 2014-11-14 NOTE — Telephone Encounter (Signed)
Rx called in to requested pharmacy 

## 2014-12-11 ENCOUNTER — Other Ambulatory Visit: Payer: Self-pay | Admitting: *Deleted

## 2014-12-11 MED ORDER — CLORAZEPATE DIPOTASSIUM 3.75 MG PO TABS: 3.7500 mg | ORAL_TABLET | Freq: Every day | ORAL | Status: DC

## 2014-12-11 NOTE — Telephone Encounter (Signed)
Received faxed refill request from pharmacy Last refill 11/14/14 #30 Last office visit 05/29/14 Is it okay to refill?

## 2014-12-11 NOTE — Telephone Encounter (Signed)
Rx called to pharmacy as instructed. 

## 2014-12-13 ENCOUNTER — Other Ambulatory Visit: Payer: Self-pay | Admitting: Family Medicine

## 2014-12-15 ENCOUNTER — Encounter: Payer: Self-pay | Admitting: Internal Medicine

## 2014-12-15 ENCOUNTER — Ambulatory Visit (INDEPENDENT_AMBULATORY_CARE_PROVIDER_SITE_OTHER): Payer: Commercial Managed Care - HMO | Admitting: Internal Medicine

## 2014-12-15 ENCOUNTER — Ambulatory Visit: Payer: Commercial Managed Care - HMO | Admitting: Internal Medicine

## 2014-12-15 VITALS — BP 126/84 | HR 101 | Temp 97.8°F | Wt 104.0 lb

## 2014-12-15 DIAGNOSIS — R350 Frequency of micturition: Secondary | ICD-10-CM | POA: Diagnosis not present

## 2014-12-15 DIAGNOSIS — R351 Nocturia: Secondary | ICD-10-CM | POA: Diagnosis not present

## 2014-12-15 LAB — POCT URINALYSIS DIPSTICK
BILIRUBIN UA: NEGATIVE
Glucose, UA: NEGATIVE
KETONES UA: NEGATIVE
Nitrite, UA: NEGATIVE
PH UA: 5.5
PROTEIN UA: NEGATIVE
RBC UA: NEGATIVE
SPEC GRAV UA: 1.02
Urobilinogen, UA: NEGATIVE

## 2014-12-15 MED ORDER — PHENAZOPYRIDINE HCL 100 MG PO TABS: 100.0000 mg | ORAL_TABLET | Freq: Three times a day (TID) | ORAL | Status: DC | PRN

## 2014-12-15 NOTE — Addendum Note (Signed)
Addended by: Lurlean Nanny on: 12/15/2014 01:39 PM   Modules accepted: Orders

## 2014-12-15 NOTE — Progress Notes (Signed)
Pre visit review using our clinic review tool, if applicable. No additional management support is needed unless otherwise documented below in the visit note. 

## 2014-12-15 NOTE — Patient Instructions (Signed)

## 2014-12-15 NOTE — Progress Notes (Signed)
HPI  Pt presents to the clinic today with c/o urinary frequency and nocturia. This started 2-3 days ago. She denies dysuria or abdominal discomfort. She denies fever, chills or low back pain. She has not tried anything OTC. She denies vaginal complaints.   Review of Systems  Past Medical History  Diagnosis Date  . Anxiety   . Depression   . GERD (gastroesophageal reflux disease)   . Osteoporosis   . Chronic headaches   . Diverticulosis     Family History  Problem Relation Age of Onset  . Stroke Father   . Hypertension Father   . Hypertension Brother   . Heart attack Brother   . Lupus Sister   . Diabetes Paternal Grandfather   . Breast cancer Paternal Aunt   . Colon cancer Neg Hx   . Aneurysm Mother     Social History   Social History  . Marital Status: Married    Spouse Name: N/A  . Number of Children: 2  . Years of Education: N/A   Occupational History  . Not on file.   Social History Main Topics  . Smoking status: Never Smoker   . Smokeless tobacco: Never Used  . Alcohol Use: No  . Drug Use: No  . Sexual Activity: Not on file   Other Topics Concern  . Not on file   Social History Narrative   Desires CPR.   Would not want feeding tubes or prolonged life support.             Allergies  Allergen Reactions  . Amoxicillin     REACTION: NAUSEA  . Cephalexin     REACTION: NAUSEA AND VOMITING  . Penicillins     REACTION: TOOK TABLETS WITHOUT PROBLEM  . Promethazine Hcl Rash    Constitutional: Denies fever, malaise, fatigue, headache or abrupt weight changes.   GU: Pt reports urgency, frequency and pain with urination. Denies burning sensation, blood in urine, odor or discharge. Skin: Denies redness, rashes, lesions or ulcercations.   No other specific complaints in a complete review of systems (except as listed in HPI above).    Objective:   Physical Exam  Pulse 101  Temp(Src) 97.8 F (36.6 C) (Oral)  Wt 104 lb (47.174 kg)  SpO2 98% Wt  Readings from Last 3 Encounters:  12/15/14 104 lb (47.174 kg)  05/29/14 102 lb 8 oz (46.494 kg)  11/11/13 103 lb (46.72 kg)    General: Appears her stated age, well developed, well nourished in NAD. Cardiovascular: Normal rate and rhythm. S1,S2 noted.   Pulmonary/Chest: Normal effort and positive vesicular breath sounds. No respiratory distress. No wheezes, rales or ronchi noted.  Abdomen: Soft. Normal bowel sounds, no bruits noted. No distention or masses noted.  Tender to palpation over the bladder area. No CVA tenderness.      Assessment & Plan:   Frequency and Nocturia:  Urinalysis: 1 + leuks Will check urine culture eRx for Pyridium 100 mg TID  Drink plenty of fluids  RTC as needed or if symptoms persist.

## 2014-12-16 LAB — URINE CULTURE: Colony Count: 3000

## 2014-12-25 ENCOUNTER — Other Ambulatory Visit: Payer: Self-pay | Admitting: Family Medicine

## 2015-01-09 ENCOUNTER — Other Ambulatory Visit: Payer: Self-pay | Admitting: *Deleted

## 2015-01-09 MED ORDER — CLORAZEPATE DIPOTASSIUM 3.75 MG PO TABS: 3.7500 mg | ORAL_TABLET | Freq: Every day | ORAL | Status: DC

## 2015-01-09 NOTE — Telephone Encounter (Signed)
Rx called in to requested pharmacy 

## 2015-01-09 NOTE — Telephone Encounter (Signed)
Last f/u 05/2014-CPE 

## 2015-02-10 ENCOUNTER — Other Ambulatory Visit: Payer: Self-pay | Admitting: *Deleted

## 2015-02-10 MED ORDER — CLORAZEPATE DIPOTASSIUM 3.75 MG PO TABS: 3.7500 mg | ORAL_TABLET | Freq: Every day | ORAL | Status: DC

## 2015-02-10 NOTE — Telephone Encounter (Signed)
Last f/u 05/2014-CPE 

## 2015-02-11 NOTE — Telephone Encounter (Signed)
Rx called in to requested pharmacy 

## 2015-02-13 ENCOUNTER — Ambulatory Visit (INDEPENDENT_AMBULATORY_CARE_PROVIDER_SITE_OTHER): Payer: Commercial Managed Care - HMO

## 2015-02-13 DIAGNOSIS — Z23 Encounter for immunization: Secondary | ICD-10-CM | POA: Diagnosis not present

## 2015-02-18 ENCOUNTER — Other Ambulatory Visit: Payer: Self-pay | Admitting: Family Medicine

## 2015-03-13 ENCOUNTER — Other Ambulatory Visit: Payer: Self-pay | Admitting: *Deleted

## 2015-03-13 MED ORDER — CLORAZEPATE DIPOTASSIUM 3.75 MG PO TABS: 3.7500 mg | ORAL_TABLET | Freq: Every day | ORAL | Status: DC

## 2015-03-13 NOTE — Telephone Encounter (Signed)
Last f/u 05/2014-CPE 

## 2015-03-17 NOTE — Telephone Encounter (Signed)
Pt called to ck on status of refill; advised called in this AM; pt will ck with pharmacy.

## 2015-03-17 NOTE — Telephone Encounter (Signed)
Rx called in to requested pharmacy 

## 2015-03-23 ENCOUNTER — Other Ambulatory Visit: Payer: Self-pay | Admitting: Family Medicine

## 2015-04-13 ENCOUNTER — Other Ambulatory Visit: Payer: Self-pay | Admitting: *Deleted

## 2015-04-13 MED ORDER — CLORAZEPATE DIPOTASSIUM 3.75 MG PO TABS: 3.7500 mg | ORAL_TABLET | Freq: Every day | ORAL | Status: DC

## 2015-04-13 NOTE — Telephone Encounter (Signed)
Rx called in to requested pharmacy 

## 2015-04-13 NOTE — Telephone Encounter (Signed)
Last f/u 05/2014-CPE 

## 2015-05-01 ENCOUNTER — Other Ambulatory Visit: Payer: Self-pay | Admitting: Family Medicine

## 2015-05-04 NOTE — Telephone Encounter (Signed)
Last refill 12/26/14 #180 Last office visit 12/15/14/acute

## 2015-05-11 ENCOUNTER — Other Ambulatory Visit: Payer: Self-pay | Admitting: Family Medicine

## 2015-05-11 NOTE — Telephone Encounter (Signed)
Rx called in to requested pharmacy 

## 2015-05-11 NOTE — Telephone Encounter (Signed)
Last f/u 05/2014 

## 2015-05-26 ENCOUNTER — Other Ambulatory Visit: Payer: Self-pay | Admitting: Family Medicine

## 2015-06-05 ENCOUNTER — Ambulatory Visit: Payer: Commercial Managed Care - HMO

## 2015-06-12 ENCOUNTER — Other Ambulatory Visit: Payer: Self-pay | Admitting: Family Medicine

## 2015-06-12 DIAGNOSIS — E785 Hyperlipidemia, unspecified: Secondary | ICD-10-CM

## 2015-06-12 DIAGNOSIS — Z0000 Encounter for general adult medical examination without abnormal findings: Secondary | ICD-10-CM

## 2015-06-15 ENCOUNTER — Other Ambulatory Visit: Payer: Self-pay | Admitting: Family Medicine

## 2015-06-15 NOTE — Telephone Encounter (Signed)
Per last Rx, OV required, pls advise.

## 2015-06-15 NOTE — Telephone Encounter (Signed)
Rx called in to requested pharmacy 

## 2015-06-16 ENCOUNTER — Other Ambulatory Visit: Payer: Self-pay | Admitting: Family Medicine

## 2015-06-17 ENCOUNTER — Telehealth: Payer: Self-pay

## 2015-06-17 NOTE — Telephone Encounter (Signed)
Pt called and [facility] does not have refill for tranzene. Medication phoned to [facility] [address] pharmacy as instructed. Pt will pick up at pharmacy later today.

## 2015-06-17 NOTE — Telephone Encounter (Signed)
See 06/15/15 refill phone note; refill done and pt notified.

## 2015-06-17 NOTE — Telephone Encounter (Signed)
Kristen Caldwell called and said that her prescription is not at [facility]. Would you check on this please. Call and let her know when it is there.

## 2015-06-19 ENCOUNTER — Other Ambulatory Visit (INDEPENDENT_AMBULATORY_CARE_PROVIDER_SITE_OTHER): Payer: Commercial Managed Care - HMO

## 2015-06-19 DIAGNOSIS — Z0000 Encounter for general adult medical examination without abnormal findings: Secondary | ICD-10-CM | POA: Diagnosis not present

## 2015-06-19 DIAGNOSIS — E785 Hyperlipidemia, unspecified: Secondary | ICD-10-CM

## 2015-06-20 LAB — COMPREHENSIVE METABOLIC PANEL
A/G RATIO: 1.6 (ref 1.2–2.2)
ALK PHOS: 67 IU/L (ref 39–117)
ALT: 17 IU/L (ref 0–32)
AST: 23 IU/L (ref 0–40)
Albumin: 4.2 g/dL (ref 3.6–4.8)
BUN/Creatinine Ratio: 18 (ref 12–28)
BUN: 14 mg/dL (ref 8–27)
Bilirubin Total: 0.4 mg/dL (ref 0.0–1.2)
CO2: 24 mmol/L (ref 18–29)
Calcium: 9.3 mg/dL (ref 8.7–10.3)
Chloride: 101 mmol/L (ref 96–106)
Creatinine, Ser: 0.79 mg/dL (ref 0.57–1.00)
GFR calc Af Amer: 88 mL/min/{1.73_m2} (ref >59)
GFR, EST NON AFRICAN AMERICAN: 77 mL/min/{1.73_m2} (ref >59)
GLOBULIN, TOTAL: 2.6 g/dL (ref 1.5–4.5)
Glucose: 97 mg/dL (ref 65–99)
POTASSIUM: 4 mmol/L (ref 3.5–5.2)
Sodium: 141 mmol/L (ref 134–144)
Total Protein: 6.8 g/dL (ref 6.0–8.5)

## 2015-06-20 LAB — CBC WITH DIFFERENTIAL/PLATELET
Basophils Absolute: 0 10*3/uL (ref 0.0–0.2)
Basos: 1 %
EOS (ABSOLUTE): 0.1 10*3/uL (ref 0.0–0.4)
EOS: 3 %
HEMATOCRIT: 42.3 % (ref 34.0–46.6)
Hemoglobin: 14.2 g/dL (ref 11.1–15.9)
Immature Grans (Abs): 0 10*3/uL (ref 0.0–0.1)
Immature Granulocytes: 0 %
LYMPHS ABS: 1.4 10*3/uL (ref 0.7–3.1)
Lymphs: 35 %
MCH: 30.1 pg (ref 26.6–33.0)
MCHC: 33.6 g/dL (ref 31.5–35.7)
MCV: 90 fL (ref 79–97)
MONOS ABS: 0.3 10*3/uL (ref 0.1–0.9)
Monocytes: 8 %
NEUTROS ABS: 2.1 10*3/uL (ref 1.4–7.0)
Neutrophils: 53 %
Platelets: 201 10*3/uL (ref 150–379)
RBC: 4.72 x10E6/uL (ref 3.77–5.28)
RDW: 13.7 % (ref 12.3–15.4)
WBC: 3.9 10*3/uL (ref 3.4–10.8)

## 2015-06-20 LAB — LIPID PANEL
CHOLESTEROL TOTAL: 210 mg/dL — AB (ref 100–199)
Chol/HDL Ratio: 2.6 ratio units (ref 0.0–4.4)
HDL: 82 mg/dL (ref >39)
LDL CALC: 115 mg/dL — AB (ref 0–99)
TRIGLYCERIDES: 65 mg/dL (ref 0–149)
VLDL CHOLESTEROL CAL: 13 mg/dL (ref 5–40)

## 2015-06-20 LAB — TSH: TSH: 2.54 u[IU]/mL (ref 0.450–4.500)

## 2015-06-22 ENCOUNTER — Encounter: Payer: Self-pay | Admitting: Family Medicine

## 2015-06-22 ENCOUNTER — Ambulatory Visit (INDEPENDENT_AMBULATORY_CARE_PROVIDER_SITE_OTHER): Payer: Commercial Managed Care - HMO | Admitting: Family Medicine

## 2015-06-22 VITALS — BP 130/82 | HR 80 | Temp 97.6°F | Ht 61.5 in | Wt 101.8 lb

## 2015-06-22 DIAGNOSIS — Z0000 Encounter for general adult medical examination without abnormal findings: Secondary | ICD-10-CM

## 2015-06-22 DIAGNOSIS — M810 Age-related osteoporosis without current pathological fracture: Secondary | ICD-10-CM

## 2015-06-22 DIAGNOSIS — K219 Gastro-esophageal reflux disease without esophagitis: Secondary | ICD-10-CM

## 2015-06-22 DIAGNOSIS — F411 Generalized anxiety disorder: Secondary | ICD-10-CM

## 2015-06-22 DIAGNOSIS — E785 Hyperlipidemia, unspecified: Secondary | ICD-10-CM

## 2015-06-22 DIAGNOSIS — F329 Major depressive disorder, single episode, unspecified: Secondary | ICD-10-CM

## 2015-06-22 DIAGNOSIS — Z79890 Hormone replacement therapy (postmenopausal): Secondary | ICD-10-CM

## 2015-06-22 DIAGNOSIS — F32A Depression, unspecified: Secondary | ICD-10-CM

## 2015-06-22 MED ORDER — AMITRIPTYLINE HCL 25 MG PO TABS: 25.0000 mg | ORAL_TABLET | Freq: Every day | ORAL | Status: DC

## 2015-06-22 MED ORDER — ESTRADIOL 0.5 MG PO TABS: 0.5000 mg | ORAL_TABLET | Freq: Every day | ORAL | Status: DC

## 2015-06-22 NOTE — Assessment & Plan Note (Signed)
Well controlled.  No changes made. 

## 2015-06-22 NOTE — Assessment & Plan Note (Signed)
The patients weight, height, BMI and visual acuity have been recorded in the chart.  Cognitive function assessed.   I have made referrals, counseling and provided education to the patient based review of the above and I have provided the pt with a written personalized care plan for preventive services.  

## 2015-06-22 NOTE — Progress Notes (Signed)
70 yo here for Annual Medicare Wellness Visit and follow up of chronic medical conditions.  I have personally reviewed the Medicare Annual Wellness questionnaire and have noted 1. The patient's medical and social history 2. Their use of alcohol, tobacco or illicit drugs 3. Their current medications and supplements 4. The patient's functional ability including ADL's, fall risks, home safety risks and hearing or visual             impairment. 5. Diet and physical activities 6. Evidence for depression or mood disorders   End of life wishes discussed and updated in Social History.  The roster of all physicians providing medical care to patient - is listed in the Snapshot section of the chart.  Overall doing well.   Remote h/o hysterectomy Mammogram 08/01/14 Colonoscopy 09/08/2008- recall in 10 years- [facility]ner Zostavax 07/30/2009 Pneumovax 04/25/2011 prevnar 13 05/29/14 Tdap 02/08/2006 Influenza vaccine 02/13/15   HLD-  Taking Fish oil.  Due for labs. Lab Results  Component Value Date   CHOL 210* 06/19/2015   HDL 82 06/19/2015   LDLCALC 115* 06/19/2015   LDLDIRECT 122.5 05/17/2012   TRIG 65 06/19/2015   CHOLHDL 2.6 06/19/2015    Anxiety- under a lot of stress.  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 Taking Elavil and Tranxene which have been effective.   Patient Active Problem List   Diagnosis Date Noted  . Medicare annual wellness visit, subsequent 05/23/2013  . Postmenopausal HRT (hormone replacement therapy) 05/23/2013  . HLD (hyperlipidemia) 06/09/2008  . Anxiety state 02/21/2007  . Depression 02/21/2007  . GERD 02/21/2007  . Osteoporosis 02/21/2007  . CARPAL TUNNEL SYNDROME, HX OF 02/21/2007   Past Medical History  Diagnosis Date  . Anxiety   . Depression   . GERD (gastroesophageal reflux disease)   . Osteoporosis   . Chronic headaches   . Diverticulosis    Past Surgical History  Procedure Laterality Date  . Abdominal hysterectomy   03/14/1989    partial  . Tubal ligation    . Colonoscopy     Social History  Substance Use Topics  . Smoking status: Never Smoker   . Smokeless tobacco: Never Used  . Alcohol Use: No   Family History  Problem Relation Age of Onset  . Stroke Father   . Hypertension Father   . Hypertension Brother   . Heart attack Brother   . Lupus Sister   . Diabetes Paternal Grandfather   . Breast cancer Paternal Aunt   . Colon cancer Neg Hx   . Aneurysm Mother    Allergies  Allergen Reactions  . Amoxicillin     REACTION: NAUSEA  . Cephalexin     REACTION: NAUSEA AND VOMITING  . Penicillins     REACTION: TOOK TABLETS WITHOUT PROBLEM  . Promethazine Hcl Rash   Current Outpatient Prescriptions on File Prior to Visit  Medication Sig Dispense Refill  . amitriptyline (ELAVIL) 25 MG tablet TAKE 1 TABLET AT BEDTIME 90 tablet 0  . Calcium Carbonate-Vit D-Min (CALTRATE 600+D PLUS) 600-400 MG-UNIT per tablet Take 2 tablets by mouth daily.     . clorazepate (TRANXENE) 3.75 MG tablet TAKE ONE TABLET BY MOUTH ONCE DAILY *OFFICE VISIT REQUIRED BEFORE ADDITIONAL REFILLS* 30 tablet 0  . estradiol (ESTRACE) 0.5 MG tablet TAKE 1 TABLET EVERY DAY 90 tablet 1  . Multiple Vitamins-Minerals (CENTRUM SILVER) CHEW Chew 1 tablet by mouth daily.      . Omega-3 Fatty Acids (FISH OIL PO) Take 1 capsule by  mouth 2 (two) times daily.      Marland Kitchen omeprazole (PRILOSEC) 20 MG capsule Take 1 capsule (20 mg total) by mouth daily. OFFICE VISIT REQUIRED FOR ADDITIONAL REFILLS 90 capsule 0  . topiramate (TOPAMAX) 25 MG tablet TAKE 1 TABLET TWICE DAILY 180 tablet 3  . phenazopyridine (PYRIDIUM) 100 MG tablet Take 1 tablet (100 mg total) by mouth 3 (three) times daily as needed for pain. (Patient not taking: Reported on 06/22/2015) 20 tablet 0   No current facility-administered medications on file prior to visit.       The PMH, PSH, Social History, Family History, Medications, and allergies have been reviewed in [facility], and have  been updated if relevant.  ROS: See HPI General: Denies fever, chills, sweats. No significant weight loss. Eyes: Denies blurring,significant itching ENT: Denies earache, sore throat, and hoarseness.  Cardiovascular: Denies chest pains, palpitations, dyspnea on exertion,  Respiratory: Denies cough, dyspnea at rest,wheeezing Breast: no concerns about lumps GI: Denies nausea, vomiting, diarrhea, constipation, change in bowel habits, abdominal pain, melena, hematochezia Musculoskeletal: Denies back pain, joint pain Derm: Denies rash, itching Neuro: Denies  paresthesias, frequent falls, frequent headaches Psych: Denies depression, anxiety Endocrine: Denies cold intolerance, heat intolerance, polydipsia Heme: Denies enlarged lymph nodes Allergy: No hayfever Appetite good- weight stable  Physical exam: BP 130/82 mmHg  Pulse 80  Temp(Src) 97.6 F (36.4 C)  Ht 5' 1.5" (1.562 m)  Wt 101 lb 12.8 oz (46.176 kg)  BMI 18.93 kg/m2  Wt Readings from Last 3 Encounters:  06/22/15 101 lb 12.8 oz (46.176 kg)  12/15/14 104 lb (47.174 kg)  05/29/14 102 lb 8 oz (46.494 kg)    General:  Well-developed,well-nourished,in no acute distress; alert,appropriate and cooperative throughout examination Head:  normocephalic and atraumatic.   Eyes:  vision grossly intact, pupils equal, pupils round, and pupils reactive to light.   Ears:  R ear normal and L ear normal.   Nose:  no external deformity.   Mouth:  good dentition.   Neck:  No deformities, masses, or tenderness noted. Breasts:  No mass, nodules, thickening, tenderness, bulging, retraction, inflamation, nipple discharge or skin changes noted.   Lungs:  Normal respiratory effort, chest expands symmetrically. Lungs are clear to auscultation, no crackles or wheezes. Heart:  Normal rate and regular rhythm. S1 and S2 normal without gallop, murmur, click, rub or other extra sounds. Abdomen:  Bowel sounds positive,abdomen soft and non-tender without  masses, organomegaly or hernias noted. Msk:  No deformity or scoliosis noted of thoracic or lumbar spine.   Extremities:  No clubbing, cyanosis, edema, or deformity noted with normal full range of motion of all joints.   Neurologic:  alert & oriented X3 and gait normal.   Skin:  Intact without suspicious lesions or rashes Cervical Nodes:  No lymphadenopathy noted Axillary Nodes:  No palpable lymphadenopathy Psych:  Cognition and judgment appear intact. Alert and cooperative with normal attention span and concentration. No apparent delusions, illusions, hallucinations

## 2015-06-22 NOTE — Patient Instructions (Signed)
Great to see you. Please call to schedule your mammogram and bone density.

## 2015-06-22 NOTE — Progress Notes (Signed)
Pre visit review using our clinic review tool, if applicable. No additional management support is needed unless otherwise documented below in the visit note. 

## 2015-07-12 ENCOUNTER — Other Ambulatory Visit: Payer: Self-pay | Admitting: Family Medicine

## 2015-07-13 ENCOUNTER — Other Ambulatory Visit: Payer: Self-pay | Admitting: Family Medicine

## 2015-07-13 MED ORDER — CLORAZEPATE DIPOTASSIUM 3.75 MG PO TABS: 3.7500 mg | ORAL_TABLET | Freq: Every day | ORAL | Status: DC

## 2015-07-13 NOTE — Telephone Encounter (Signed)
Rx called in to requested pharmacy 

## 2015-07-13 NOTE — Telephone Encounter (Signed)
Ok to refill one time only. 

## 2015-07-13 NOTE — Telephone Encounter (Signed)
Last f/u 06/2015-CPE 

## 2015-08-04 ENCOUNTER — Encounter: Payer: Self-pay | Admitting: Family Medicine

## 2015-08-11 ENCOUNTER — Other Ambulatory Visit: Payer: Self-pay | Admitting: Family Medicine

## 2015-08-11 MED ORDER — CLORAZEPATE DIPOTASSIUM 3.75 MG PO TABS: 3.7500 mg | ORAL_TABLET | Freq: Every day | ORAL | Status: DC

## 2015-08-11 NOTE — Telephone Encounter (Signed)
Last f/u 06/2015 

## 2015-08-11 NOTE — Telephone Encounter (Signed)
Rx called in to requested pharmacy 

## 2015-08-12 ENCOUNTER — Encounter: Payer: Self-pay | Admitting: Family Medicine

## 2015-09-09 ENCOUNTER — Other Ambulatory Visit: Payer: Self-pay | Admitting: Family Medicine

## 2015-09-10 MED ORDER — CLORAZEPATE DIPOTASSIUM 3.75 MG PO TABS: 3.7500 mg | ORAL_TABLET | Freq: Every day | ORAL | Status: DC

## 2015-09-10 NOTE — Telephone Encounter (Signed)
Last f/u 06/2015 

## 2015-09-10 NOTE — Telephone Encounter (Signed)
Rx called in to requested pharmacy 

## 2015-09-14 ENCOUNTER — Encounter: Payer: Self-pay | Admitting: Family Medicine

## 2015-09-14 ENCOUNTER — Ambulatory Visit (INDEPENDENT_AMBULATORY_CARE_PROVIDER_SITE_OTHER): Payer: Commercial Managed Care - HMO | Admitting: Family Medicine

## 2015-09-14 ENCOUNTER — Telehealth: Payer: Self-pay | Admitting: Family Medicine

## 2015-09-14 VITALS — BP 128/62 | HR 79 | Temp 98.1°F | Wt 101.0 lb

## 2015-09-14 DIAGNOSIS — M810 Age-related osteoporosis without current pathological fracture: Secondary | ICD-10-CM | POA: Diagnosis not present

## 2015-09-14 MED ORDER — ESTRADIOL 0.5 MG PO TABS: 0.5000 mg | ORAL_TABLET | Freq: Every day | ORAL | Status: DC

## 2015-09-14 NOTE — Progress Notes (Signed)
Subjective:   Patient ID: Kristen Caldwell, female    DOB: 1945/07/20, 70 y.o.   MRN: [id number]  Kristen Caldwell is a pleasant 70 y.o. year old female who presents to clinic today with Follow-up  on 09/14/2015  HPI:  Osteoporosis- here to discuss recent DEXA scan.  T score now -3.6  Does take caltrate daily.  Years ago, took fosamax and then evista but they  caused vomiting.  She is active.  No recent fractures.    Current Outpatient Prescriptions on File Prior to Visit  Medication Sig Dispense Refill  . amitriptyline (ELAVIL) 25 MG tablet Take 1 tablet (25 mg total) by mouth at bedtime. 90 tablet 0  . Calcium Carbonate-Vit D-Min (CALTRATE 600+D PLUS) 600-400 MG-UNIT per tablet Take 2 tablets by mouth daily.     . clorazepate (TRANXENE) 3.75 MG tablet Take 1 tablet (3.75 mg total) by mouth daily. 30 tablet 0  . Multiple Vitamins-Minerals (CENTRUM SILVER) CHEW Chew 1 tablet by mouth daily.      . Omega-3 Fatty Acids (FISH OIL PO) Take 1 capsule by mouth 2 (two) times daily.      Marland Kitchen omeprazole (PRILOSEC) 20 MG capsule Take 1 capsule (20 mg total) by mouth daily. OFFICE VISIT REQUIRED FOR ADDITIONAL REFILLS 90 capsule 0  . topiramate (TOPAMAX) 25 MG tablet TAKE 1 TABLET TWICE DAILY 180 tablet 3   No current facility-administered medications on file prior to visit.    Allergies  Allergen Reactions  . Amoxicillin     REACTION: NAUSEA  . Cephalexin     REACTION: NAUSEA AND VOMITING  . Penicillins     REACTION: TOOK TABLETS WITHOUT PROBLEM  . Promethazine Hcl Rash    Past Medical History  Diagnosis Date  . Anxiety   . Depression   . GERD (gastroesophageal reflux disease)   . Osteoporosis   . Chronic headaches   . Diverticulosis     Past Surgical History  Procedure Laterality Date  . Abdominal hysterectomy  03/14/1989    partial  . Tubal ligation    . Colonoscopy      Family History  Problem Relation Age of Onset  . Stroke Father   .  Hypertension Father   . Hypertension Brother   . Heart attack Brother   . Lupus Sister   . Diabetes Paternal Grandfather   . Breast cancer Paternal Aunt   . Colon cancer Neg Hx   . Aneurysm Mother     Social History   Social History  . Marital Status: Married    Spouse Name: N/A  . Number of Children: 2  . Years of Education: N/A   Occupational History  . Not on file.   Social History Main Topics  . Smoking status: Never Smoker   . Smokeless tobacco: Never Used  . Alcohol Use: No  . Drug Use: No  . Sexual Activity: Not on file   Other Topics Concern  . Not on file   Social History Narrative   Desires CPR.   Would not want feeding tubes or prolonged life support.            The PMH, PSH, Social History, Family History, Medications, and allergies have been reviewed in [facility], and have been updated if relevant.   Review of Systems  HENT: Negative.   Respiratory: Negative.   Cardiovascular: Negative.   Gastrointestinal:       + history of gerd + history of "swallowing issues"  Musculoskeletal: Negative.   All other systems reviewed and are negative.      Objective:    BP 128/62 mmHg  Pulse 79  Temp(Src) 98.1 F (36.7 C) (Oral)  Wt 101 lb (45.813 kg)  SpO2 98%   Physical Exam  Constitutional: She is oriented to person, place, and time.  Thin, pleasant female in NAD  HENT:  Head: Normocephalic.  Cardiovascular: Normal rate.   Pulmonary/Chest: Effort normal.  Musculoskeletal: Normal range of motion.  Neurological: She is alert and oriented to person, place, and time. No cranial nerve deficit.  Skin: Skin is warm and dry.  Psychiatric: She has a normal mood and affect. Her behavior is normal. Judgment and thought content normal.  Vitals reviewed.         Assessment & Plan:   Osteoporosis No Follow-up on file.

## 2015-09-14 NOTE — Telephone Encounter (Signed)
Kristen Caldwell came in the office and said she was charged a copay amount of $10 for her last two physicals. She thinks they're being coded incorrectly and would like someone to look into this for her. Please give her a phone call regarding this.  Pt's ph# [phone]  Thank you.

## 2015-09-14 NOTE — Assessment & Plan Note (Signed)
>  25 minutes spent in face to face time with patient, >50% spent in counselling or coordination of care Apply for prolia Significant h/o GERD, vomiting with previous oral rxs. Continue calcium. Continue weight bearing exercise. The patient indicates understanding of these issues and agrees with the plan.

## 2015-09-14 NOTE — Progress Notes (Signed)
Pre visit review using our clinic review tool, if applicable. No additional management support is needed unless otherwise documented below in the visit note. 

## 2015-09-15 NOTE — Telephone Encounter (Signed)
Rose, please look into this for the pt.

## 2015-09-16 ENCOUNTER — Telehealth: Payer: Self-pay | Admitting: Family Medicine

## 2015-09-16 NOTE — Telephone Encounter (Signed)
I have electronically submitted pt's info for [employer] insurance verification and will notify you once I have a response. Thank you. °

## 2015-09-19 ENCOUNTER — Other Ambulatory Visit: Payer: Self-pay | Admitting: Family Medicine

## 2015-09-21 NOTE — Telephone Encounter (Signed)
Notified pt and explained that an OV was billed in addition to the AWV due to chronic issues being discussed at the AWV. She understood why she had the co-pay.  Thank you!

## 2015-10-07 ENCOUNTER — Other Ambulatory Visit: Payer: Self-pay | Admitting: Family Medicine

## 2015-10-08 ENCOUNTER — Other Ambulatory Visit: Payer: Self-pay | Admitting: Family Medicine

## 2015-10-08 MED ORDER — CLORAZEPATE DIPOTASSIUM 3.75 MG PO TABS: 3.7500 mg | ORAL_TABLET | Freq: Every day | ORAL | 0 refills | Status: DC

## 2015-10-08 NOTE — Telephone Encounter (Signed)
Rx called in to requested pharmacy 

## 2015-10-08 NOTE — Telephone Encounter (Signed)
Last f/u 06/2015-CPE 

## 2015-11-09 ENCOUNTER — Other Ambulatory Visit: Payer: Self-pay | Admitting: Family Medicine

## 2015-11-09 MED ORDER — CLORAZEPATE DIPOTASSIUM 3.75 MG PO TABS: 3.7500 mg | ORAL_TABLET | Freq: Every day | ORAL | 0 refills | Status: DC

## 2015-11-09 NOTE — Telephone Encounter (Signed)
Last f/u 06/2015-CPE 

## 2015-11-10 NOTE — Telephone Encounter (Signed)
Rx called in to requested pharmacy 

## 2015-11-13 ENCOUNTER — Ambulatory Visit (INDEPENDENT_AMBULATORY_CARE_PROVIDER_SITE_OTHER): Payer: Commercial Managed Care - HMO

## 2015-11-13 DIAGNOSIS — Z23 Encounter for immunization: Secondary | ICD-10-CM

## 2015-11-13 NOTE — Telephone Encounter (Signed)
PT came in for flu shot and brought notice of approval for the shot. Will make a copy for scanning into medical records.

## 2015-11-23 NOTE — Telephone Encounter (Signed)
I have rec'd authorization from [employer] for [place].  Per [place] at [employer] Kristen Caldwell is approved eff 11/03/2015-05/03/2016, p/a [id number].  Her estimated responsibility will be $57.  Please make pt aware this is an estimate and we will not know an exact amt until insurance(s) has/have paid.  I have sent a copy of the summary of benefits to be scanned into pt's chart.    Once pt recs injection, please let me know actual injection date so I can update the [facility] portal.  If you have any questions, please let me know.  Thank you!  Cc: Kristen Caldwell (for ordering purposes)

## 2015-11-23 NOTE — Telephone Encounter (Signed)
Spoke to pt and advised. Ca lab scheduled.

## 2015-11-24 NOTE — Telephone Encounter (Signed)
Prolia ordered for pt.

## 2015-11-27 ENCOUNTER — Other Ambulatory Visit: Payer: Self-pay | Admitting: Family Medicine

## 2015-11-27 DIAGNOSIS — M810 Age-related osteoporosis without current pathological fracture: Secondary | ICD-10-CM

## 2015-12-01 ENCOUNTER — Other Ambulatory Visit (INDEPENDENT_AMBULATORY_CARE_PROVIDER_SITE_OTHER): Payer: Commercial Managed Care - HMO

## 2015-12-01 DIAGNOSIS — M810 Age-related osteoporosis without current pathological fracture: Secondary | ICD-10-CM

## 2015-12-01 NOTE — Addendum Note (Signed)
Addended by: Royann Shivers A on: 12/01/2015 04:12 PM   Modules accepted: Orders

## 2015-12-03 LAB — CALCIUM: CALCIUM: 9.2 mg/dL (ref 8.7–10.3)

## 2015-12-08 ENCOUNTER — Other Ambulatory Visit: Payer: Self-pay | Admitting: Family Medicine

## 2015-12-08 MED ORDER — CLORAZEPATE DIPOTASSIUM 3.75 MG PO TABS: 3.7500 mg | ORAL_TABLET | Freq: Every day | ORAL | 0 refills | Status: DC

## 2015-12-08 NOTE — Telephone Encounter (Signed)
Last f/u 06/2015-CPE 

## 2015-12-08 NOTE — Telephone Encounter (Signed)
Rx called in to requested pharmacy 

## 2015-12-10 ENCOUNTER — Ambulatory Visit (INDEPENDENT_AMBULATORY_CARE_PROVIDER_SITE_OTHER): Payer: Commercial Managed Care - HMO | Admitting: Family Medicine

## 2015-12-10 DIAGNOSIS — M810 Age-related osteoporosis without current pathological fracture: Secondary | ICD-10-CM | POA: Diagnosis not present

## 2015-12-10 MED ORDER — DENOSUMAB 60 MG/ML ~~LOC~~ SOLN
60.0000 mg | Freq: Once | SUBCUTANEOUS | Status: AC
  Administered 2015-12-10: 60 mg via SUBCUTANEOUS

## 2015-12-10 NOTE — Progress Notes (Signed)
prolia injection today 

## 2015-12-14 ENCOUNTER — Encounter: Payer: Self-pay | Admitting: Family Medicine

## 2015-12-14 ENCOUNTER — Telehealth: Payer: Self-pay

## 2015-12-14 NOTE — Telephone Encounter (Signed)
Pt states that after receiving the Prolia shot on Thursday she's feeling very nauseous. She took some bonine and it helped some, she would like to know if there's anything else that she can take.

## 2015-12-14 NOTE — Telephone Encounter (Signed)
If she is feeling better, I would give it a little time.  Please drink plenty of fluids and keep [place] updated.

## 2015-12-14 NOTE — Telephone Encounter (Signed)
Spoke to pt and advised. States she will keep [place] updated should she not begin to feel better.

## 2016-01-07 ENCOUNTER — Other Ambulatory Visit: Payer: Self-pay | Admitting: Family Medicine

## 2016-01-07 MED ORDER — CLORAZEPATE DIPOTASSIUM 3.75 MG PO TABS: 3.7500 mg | ORAL_TABLET | Freq: Every day | ORAL | 0 refills | Status: DC

## 2016-01-07 NOTE — Telephone Encounter (Signed)
Last f/u 06/2015-CPE 

## 2016-01-07 NOTE — Telephone Encounter (Signed)
Rx called in to requested pharmacy 

## 2016-02-08 ENCOUNTER — Other Ambulatory Visit: Payer: Self-pay | Admitting: Family Medicine

## 2016-02-08 MED ORDER — CLORAZEPATE DIPOTASSIUM 3.75 MG PO TABS: 3.7500 mg | ORAL_TABLET | Freq: Every day | ORAL | 0 refills | Status: DC

## 2016-02-08 NOTE — Telephone Encounter (Signed)
Last f/u 06/2015-CPE 

## 2016-02-08 NOTE — Telephone Encounter (Signed)
Rx faxed to requested pharmacy 

## 2016-02-08 NOTE — Addendum Note (Signed)
Addended by: Modena Nunnery on: 02/08/2016 09:54 AM   Modules accepted: Orders

## 2016-02-09 ENCOUNTER — Telehealth: Payer: Self-pay | Admitting: *Deleted

## 2016-02-09 ENCOUNTER — Encounter: Payer: Self-pay | Admitting: Family Medicine

## 2016-02-09 NOTE — Telephone Encounter (Signed)
Patient left a voicemail stating that her Clorazepate was sent to her mail order pharmacy and should have been sent to [facility]. Patient stated that she cancelled the script with [facility] because it was too expensive. Patient would like a call back when this has been taken care of.

## 2016-02-09 NOTE — Telephone Encounter (Signed)
Please help pt with this

## 2016-02-09 NOTE — Telephone Encounter (Signed)
Rx faxed to [facility] as requested

## 2016-03-08 ENCOUNTER — Encounter: Payer: Self-pay | Admitting: Family Medicine

## 2016-03-09 ENCOUNTER — Other Ambulatory Visit: Payer: Self-pay | Admitting: Family Medicine

## 2016-03-09 MED ORDER — CLORAZEPATE DIPOTASSIUM 3.75 MG PO TABS: 3.7500 mg | ORAL_TABLET | Freq: Every day | ORAL | 0 refills | Status: DC

## 2016-03-09 NOTE — Telephone Encounter (Signed)
Rx called in to requested pharmacy. Spoke to pt and requested that she make refill request directly through her [place]. She is selecting one pharmacy through the [employer], but is typing an alternate pharmacy which is why it was sent to the incorrect location last month. Pt verbally expressed understanding

## 2016-03-09 NOTE — Telephone Encounter (Signed)
Last f/u 06/2015-CPE 

## 2016-03-21 ENCOUNTER — Telehealth: Payer: Self-pay | Admitting: Family Medicine

## 2016-03-21 NOTE — Telephone Encounter (Signed)
Patient Name: Kristen Caldwell  DOB: 10/09/1945    Initial Comment Caller says, has a cough, congestion, runny nose, has been using Robitussin and Mucinex.    Nurse Assessment  Nurse: Raphael Gibney, RN, Vanita Ingles Date/Time (Eastern Time): 03/21/2016 11:39:15 AM  Confirm and document reason for call. If symptomatic, describe symptoms. ---Caller states she has cough, nasal congestion. She has a runny nose. Lots of coughing. Using Robitussin and Mucinex. No fever. Symptoms started on Saturday.  Does the patient have any new or worsening symptoms? ---Yes  Will a triage be completed? ---Yes  Related visit to physician within the last 2 weeks? ---No  Does the PT have any chronic conditions? (i.e. diabetes, asthma, etc.) ---No  Is this a behavioral health or substance abuse call? ---No     Guidelines    Guideline Title Affirmed Question Affirmed Notes  Sinus Pain or Congestion Lots of coughing   Sinus Pain or Congestion Lots of coughing   Cough - Acute Productive SEVERE coughing spells (e.g., whooping sound after coughing, vomiting after coughing)    Final Disposition User   See Physician within 24 Hours Stringer, RN, Vanita Ingles    Comments  pt states she has jury duty on Wednesday and is trying to get appt for today. No appts available at [facility] or [facility]. Pt states she will go to urgent care.   Referrals  GO TO FACILITY UNDECIDED  GO TO FACILITY UNDECIDED   Disagree/Comply: Comply    Disagree/Comply: Comply    Disagree/Comply: Comply

## 2016-03-24 ENCOUNTER — Ambulatory Visit (INDEPENDENT_AMBULATORY_CARE_PROVIDER_SITE_OTHER): Payer: PPO | Admitting: Family Medicine

## 2016-03-24 ENCOUNTER — Encounter: Payer: Self-pay | Admitting: Family Medicine

## 2016-03-24 VITALS — BP 136/80 | HR 94 | Temp 98.0°F | Wt 103.2 lb

## 2016-03-24 DIAGNOSIS — J069 Acute upper respiratory infection, unspecified: Secondary | ICD-10-CM

## 2016-03-24 NOTE — Progress Notes (Signed)
Pre visit review using our clinic review tool, if applicable. No additional management support is needed unless otherwise documented below in the visit note. 

## 2016-03-24 NOTE — Progress Notes (Signed)
SUBJECTIVE:  Kristen Caldwell is a 71 y.o. female who complains of coryza, congestion, sore throat, post nasal drip and productive cough for 5 days. She denies a history of anorexia, chest pain and fevers and denies a history of asthma. Patient denies smoke cigarettes.   Current Outpatient Prescriptions on File Prior to Visit  Medication Sig Dispense Refill  . amitriptyline (ELAVIL) 25 MG tablet Take 1 tablet (25 mg total) by mouth at bedtime. 90 tablet 0  . Calcium Carbonate-Vit D-Min (CALTRATE 600+D PLUS) 600-400 MG-UNIT per tablet Take 2 tablets by mouth daily.     . clorazepate (TRANXENE) 3.75 MG tablet Take 1 tablet (3.75 mg total) by mouth daily. 30 tablet 0  . estradiol (ESTRACE) 0.5 MG tablet Take 1 tablet (0.5 mg total) by mouth daily. 90 tablet 3  . Multiple Vitamins-Minerals (CENTRUM SILVER) CHEW Chew 1 tablet by mouth daily.      . Omega-3 Fatty Acids (FISH OIL PO) Take 1 capsule by mouth 2 (two) times daily.      Marland Kitchen omeprazole (PRILOSEC) 20 MG capsule TAKE 1 CAPSULE EVERY DAY 90 capsule 2  . topiramate (TOPAMAX) 25 MG tablet TAKE 1 TABLET TWICE DAILY 180 tablet 3   No current facility-administered medications on file prior to visit.     Allergies  Allergen Reactions  . Amoxicillin     REACTION: NAUSEA  . Cephalexin     REACTION: NAUSEA AND VOMITING  . Penicillins     REACTION: TOOK TABLETS WITHOUT PROBLEM  . Promethazine Hcl Rash    Past Medical History:  Diagnosis Date  . Anxiety   . Chronic headaches   . Depression   . Diverticulosis   . GERD (gastroesophageal reflux disease)   . Osteoporosis     Past Surgical History:  Procedure Laterality Date  . ABDOMINAL HYSTERECTOMY  03/14/1989   partial  . COLONOSCOPY    . TUBAL LIGATION      Family History  Problem Relation Age of Onset  . Stroke Father   . Hypertension Father   . Hypertension Brother   . Heart attack Brother   . Lupus Sister   . Diabetes Paternal Grandfather   . Breast cancer  Paternal Aunt   . Colon cancer Neg Hx   . Aneurysm Mother     Social History   Social History  . Marital status: Married    Spouse name: N/A  . Number of children: 2  . Years of education: N/A   Occupational History  . Not on file.   Social History Main Topics  . Smoking status: Never Smoker  . Smokeless tobacco: Never Used  . Alcohol use No  . Drug use: No  . Sexual activity: Not on file   Other Topics Concern  . Not on file   Social History Narrative   Desires CPR.   Would not want feeding tubes or prolonged life support.            The PMH, PSH, Social History, Family History, Medications, and allergies have been reviewed in [facility], and have been updated if relevant.  OBJECTIVE: BP 136/80   Pulse 94   Temp 98 F (36.7 C) (Oral)   Wt 103 lb 4 oz (46.8 kg)   SpO2 98%   BMI 19.19 kg/m   She appears well, vital signs are as noted. Ears normal.  Throat and pharynx normal.  Neck supple. No adenopathy in the neck. Nose is congested. Sinuses non tender. The chest  is clear, without wheezes or rales.  ASSESSMENT:  viral upper respiratory illness  PLAN: Symptomatic therapy suggested: push fluids, rest and return office visit prn if symptoms persist or worsen. Lack of antibiotic effectiveness discussed with her. Call or return to clinic prn if these symptoms worsen or fail to improve as anticipated.

## 2016-03-24 NOTE — Patient Instructions (Signed)
Great to see you. I think this is just a virus.  Happy birthday!  Keep taking Robitussin and mucinex.

## 2016-03-30 ENCOUNTER — Encounter: Payer: Self-pay | Admitting: Family Medicine

## 2016-04-01 ENCOUNTER — Encounter: Payer: Self-pay | Admitting: Family Medicine

## 2016-04-01 ENCOUNTER — Other Ambulatory Visit: Payer: Self-pay | Admitting: Family Medicine

## 2016-04-01 MED ORDER — AZITHROMYCIN 250 MG PO TABS: ORAL_TABLET | ORAL | 0 refills | Status: DC

## 2016-04-01 NOTE — Addendum Note (Signed)
Addended by: Modena Nunnery on: 04/01/2016 02:24 PM   Modules accepted: Orders

## 2016-04-05 ENCOUNTER — Other Ambulatory Visit: Payer: Self-pay | Admitting: Family Medicine

## 2016-04-06 NOTE — Telephone Encounter (Signed)
Rx called in to requested pharmacy 

## 2016-04-06 NOTE — Telephone Encounter (Signed)
Last f/u 06/2015 

## 2016-05-04 ENCOUNTER — Telehealth: Payer: Self-pay

## 2016-05-04 NOTE — Telephone Encounter (Signed)
Pt stuck her finger with end of screwdriver, more like a pinch, did not break the skin but pt sews and sticks her self a lot; pt did not want to make appt to be seen but wants to get tetanus shot; advised pt I can schedule nurse visit or pt can get at health dept or pharmacy. Pt will ck with ins co to see which is less expensive and will cb if needed for appt. FYi to Dr Deborra Medina.

## 2016-05-05 ENCOUNTER — Other Ambulatory Visit: Payer: Self-pay | Admitting: Family Medicine

## 2016-05-05 NOTE — Telephone Encounter (Signed)
Last filled 04-06-16 #30. Last OV 12-10-15 FOR OSTEOPOROSIS. No Future OV

## 2016-05-05 NOTE — Telephone Encounter (Signed)
Left refill on voice mail at pharmacy  

## 2016-05-18 ENCOUNTER — Telehealth: Payer: Self-pay | Admitting: *Deleted

## 2016-05-18 NOTE — Telephone Encounter (Signed)
Information has been submitted to pts insurance for verification of benefits. Awaiting response for coverage  

## 2016-05-24 ENCOUNTER — Other Ambulatory Visit: Payer: Self-pay | Admitting: Family Medicine

## 2016-06-06 ENCOUNTER — Other Ambulatory Visit: Payer: Self-pay | Admitting: Family Medicine

## 2016-06-06 DIAGNOSIS — Z01419 Encounter for gynecological examination (general) (routine) without abnormal findings: Secondary | ICD-10-CM

## 2016-06-07 ENCOUNTER — Other Ambulatory Visit: Payer: Self-pay | Admitting: Family Medicine

## 2016-06-07 NOTE — Telephone Encounter (Signed)
Called pharmacy for Rx clorazepate

## 2016-06-07 NOTE — Telephone Encounter (Signed)
Pt last seen OV 03/24/2016 and refill is not on the med. List..Marland KitchenMarland KitchenMarland Kitchenplease advise

## 2016-06-09 ENCOUNTER — Ambulatory Visit: Payer: Commercial Managed Care - HMO

## 2016-06-09 NOTE — Telephone Encounter (Signed)
Verification of benefits have been processed and an approval has been received for pts prolia injection. Pts estimated cost are appx $220 w/o an OV and $230 with. This is only an estimate and cannot be confirmed until benefits are paid. Please advise pt and schedule if needed. If scheduled, once the injection is received, pls contact me back with the date it was received so that I am able to update prolia folder. thanks   If agreeable, pls schedule Ca lab and injection one week following.

## 2016-06-09 NOTE — Telephone Encounter (Signed)
Called pt about verification  approval the prolia shot....the patient stated called multiple times to let the office know to not schedule next appt. Due to pt having nausea after taking the last prolia shot

## 2016-06-09 NOTE — Telephone Encounter (Signed)
I will let Waynetta know.  I am sorry she had such side effects.

## 2016-06-24 ENCOUNTER — Other Ambulatory Visit (INDEPENDENT_AMBULATORY_CARE_PROVIDER_SITE_OTHER): Payer: PPO

## 2016-06-24 DIAGNOSIS — Z01419 Encounter for gynecological examination (general) (routine) without abnormal findings: Secondary | ICD-10-CM | POA: Diagnosis not present

## 2016-06-24 LAB — COMPREHENSIVE METABOLIC PANEL
ALT: 15 U/L (ref 0–35)
AST: 19 U/L (ref 0–37)
Albumin: 4.1 g/dL (ref 3.5–5.2)
Alkaline Phosphatase: 49 U/L (ref 39–117)
BILIRUBIN TOTAL: 0.5 mg/dL (ref 0.2–1.2)
BUN: 16 mg/dL (ref 6–23)
CALCIUM: 9.4 mg/dL (ref 8.4–10.5)
CHLORIDE: 106 meq/L (ref 96–112)
CO2: 30 meq/L (ref 19–32)
CREATININE: 0.93 mg/dL (ref 0.40–1.20)
GFR: 63.3 mL/min (ref >60.00)
Glucose, Bld: 90 mg/dL (ref 70–99)
Potassium: 3.9 mEq/L (ref 3.5–5.1)
SODIUM: 141 meq/L (ref 135–145)
Total Protein: 6.9 g/dL (ref 6.0–8.3)

## 2016-06-24 LAB — CBC WITH DIFFERENTIAL/PLATELET
BASOS ABS: 0 10*3/uL (ref 0.0–0.1)
BASOS PCT: 0.6 % (ref 0.0–3.0)
EOS ABS: 0.1 10*3/uL (ref 0.0–0.7)
Eosinophils Relative: 3 % (ref 0.0–5.0)
HEMATOCRIT: 44.4 % (ref 36.0–46.0)
HEMOGLOBIN: 14.7 g/dL (ref 12.0–15.0)
Lymphocytes Relative: 33.2 % (ref 12.0–46.0)
Lymphs Abs: 1.4 10*3/uL (ref 0.7–4.0)
MCHC: 33 g/dL (ref 30.0–36.0)
MCV: 92.4 fl (ref 78.0–100.0)
MONO ABS: 0.4 10*3/uL (ref 0.1–1.0)
Monocytes Relative: 8.9 % (ref 3.0–12.0)
Neutro Abs: 2.3 10*3/uL (ref 1.4–7.7)
Neutrophils Relative %: 54.3 % (ref 43.0–77.0)
PLATELETS: 198 10*3/uL (ref 150.0–400.0)
RBC: 4.81 Mil/uL (ref 3.87–5.11)
RDW: 13.2 % (ref 11.5–15.5)
WBC: 4.3 10*3/uL (ref 4.0–10.5)

## 2016-06-24 LAB — LIPID PANEL
CHOL/HDL RATIO: 3
Cholesterol: 224 mg/dL — ABNORMAL HIGH (ref 0–200)
HDL: 72.5 mg/dL (ref >39.00)
LDL CALC: 139 mg/dL — AB (ref 0–99)
NONHDL: 151.08
Triglycerides: 61 mg/dL (ref 0.0–149.0)
VLDL: 12.2 mg/dL (ref 0.0–40.0)

## 2016-06-24 LAB — TSH: TSH: 2.91 u[IU]/mL (ref 0.35–4.50)

## 2016-06-29 ENCOUNTER — Ambulatory Visit (INDEPENDENT_AMBULATORY_CARE_PROVIDER_SITE_OTHER): Payer: PPO | Admitting: Family Medicine

## 2016-06-29 ENCOUNTER — Encounter: Payer: Self-pay | Admitting: Family Medicine

## 2016-06-29 VITALS — BP 118/76 | HR 93 | Temp 97.4°F | Ht 62.25 in | Wt 102.0 lb

## 2016-06-29 DIAGNOSIS — E785 Hyperlipidemia, unspecified: Secondary | ICD-10-CM

## 2016-06-29 DIAGNOSIS — Z0000 Encounter for general adult medical examination without abnormal findings: Secondary | ICD-10-CM | POA: Insufficient documentation

## 2016-06-29 DIAGNOSIS — F411 Generalized anxiety disorder: Secondary | ICD-10-CM

## 2016-06-29 DIAGNOSIS — M810 Age-related osteoporosis without current pathological fracture: Secondary | ICD-10-CM

## 2016-06-29 DIAGNOSIS — K219 Gastro-esophageal reflux disease without esophagitis: Secondary | ICD-10-CM

## 2016-06-29 MED ORDER — ALENDRONATE SODIUM 70 MG PO TABS: 70.0000 mg | ORAL_TABLET | ORAL | 11 refills | Status: DC

## 2016-06-29 NOTE — Assessment & Plan Note (Signed)
She is trying to wean off PPI.

## 2016-06-29 NOTE — Assessment & Plan Note (Signed)
Deteriorated. She is taking fish oil. She will work on cutting back on [employer].

## 2016-06-29 NOTE — Assessment & Plan Note (Signed)
The patients weight, height, BMI and visual acuity have been recorded in the chart.  Cognitive function assessed.   I have made referrals, counseling and provided education to the patient based review of the above and I have provided the pt with a written personalized care plan for preventive services.  

## 2016-06-29 NOTE — Assessment & Plan Note (Signed)
Stable- she has a lot of stress with her husband's failing health but feels current rx as working well.

## 2016-06-29 NOTE — Progress Notes (Signed)
71 yo here for Annual Medicare Wellness Visit and follow up of chronic medical conditions.  I have personally reviewed the Medicare Annual Wellness questionnaire and have noted 1. The patient's medical and social history 2. Their use of alcohol, tobacco or illicit drugs 3. Their current medications and supplements 4. The patient's functional ability including ADL's, fall risks, home safety risks and hearing or visual             impairment. 5. Diet and physical activities 6. Evidence for depression or mood disorders   End of life wishes discussed and updated in Social History.  The roster of all physicians providing medical care to patient - is listed in the Snapshot section of the chart.    Remote h/o hysterectomy Mammogram 08/03/15 Colonoscopy 09/08/2008- recall in 10 years- [facility] Zostavax 07/30/2009 Pneumovax 04/25/2011 prevnar 13 05/29/14 Tdap 02/08/2006 Influenza vaccine 11/13/15  DEXA 08/03/15- osteoporosis, started prolia.  Last dose received 12/10/15.  She is refusing further prolia because she felt it caused nausea. Was on evista which also made her nauseated.    HLD-  Taking Fish oil.   Lab Results  Component Value Date   CHOL 224 (H) 06/24/2016   HDL 72.50 06/24/2016   LDLCALC 139 (H) 06/24/2016   LDLDIRECT 122.5 05/17/2012   TRIG 61.0 06/24/2016   CHOLHDL 3 06/24/2016    Anxiety- under a lot of stress.  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 Taking Elavil and Tranxene which have been effective.  Lab Results  Component Value Date   CREATININE 0.93 06/24/2016   Lab Results  Component Value Date   ALT 15 06/24/2016   AST 19 06/24/2016   ALKPHOS 49 06/24/2016   BILITOT 0.5 06/24/2016    Lab Results  Component Value Date   WBC 4.3 06/24/2016   HGB 14.7 06/24/2016   HCT 44.4 06/24/2016   MCV 92.4 06/24/2016   PLT 198.0 06/24/2016   Lab Results  Component Value Date   TSH 2.91 06/24/2016     Patient Active Problem List   Diagnosis Date Noted  . Postmenopausal HRT (hormone replacement therapy) 05/23/2013  . HLD (hyperlipidemia) 06/09/2008  . Anxiety state 02/21/2007  . Depression 02/21/2007  . GERD 02/21/2007  . Osteoporosis 02/21/2007  . CARPAL TUNNEL SYNDROME, HX OF 02/21/2007   Past Medical History:  Diagnosis Date  . Anxiety   . Chronic headaches   . Depression   . Diverticulosis   . GERD (gastroesophageal reflux disease)   . Osteoporosis    Past Surgical History:  Procedure Laterality Date  . ABDOMINAL HYSTERECTOMY  03/14/1989   partial  . COLONOSCOPY    . TUBAL LIGATION     Social History  Substance Use Topics  . Smoking status: Never Smoker  . Smokeless tobacco: Never Used  . Alcohol use No   Family History  Problem Relation Age of Onset  . Stroke Father   . Hypertension Father   . Hypertension Brother   . Heart attack Brother   . Lupus Sister   . Diabetes Paternal Grandfather   . Breast cancer Paternal Aunt   . Colon cancer Neg Hx   . Aneurysm Mother    Allergies  Allergen Reactions  . Amoxicillin     REACTION: NAUSEA  . Cephalexin     REACTION: NAUSEA AND VOMITING  . Penicillins     REACTION: TOOK TABLETS WITHOUT PROBLEM  . Promethazine Hcl Rash   Current Outpatient Prescriptions on File Prior to Visit  Medication Sig Dispense Refill  . amitriptyline (ELAVIL) 25 MG tablet TAKE ONE TABLET BY MOUTH AT BEDTIME 90 tablet 0  . Calcium Carbonate-Vit D-Min (CALTRATE 600+D PLUS) 600-400 MG-UNIT per tablet Take 2 tablets by mouth daily.     . clorazepate (TRANXENE) 3.75 MG tablet TAKE 1 TABLET BY MOUTH ONCE DAILY 30 tablet 0  . estradiol (ESTRACE) 0.5 MG tablet Take 1 tablet (0.5 mg total) by mouth daily. 90 tablet 3  . Multiple Vitamins-Minerals (CENTRUM SILVER) CHEW Chew 1 tablet by mouth daily.      . Omega-3 Fatty Acids (FISH OIL PO) Take 1 capsule by mouth 2 (two) times daily.      Marland Kitchen omeprazole (PRILOSEC) 20 MG capsule TAKE 1 CAPSULE EVERY DAY 90 capsule 2  .  topiramate (TOPAMAX) 25 MG tablet TAKE 1 TABLET TWICE DAILY 180 tablet 3   No current facility-administered medications on file prior to visit.        The PMH, PSH, Social History, Family History, Medications, and allergies have been reviewed in [facility], and have been updated if relevant.  Review of Systems  Constitutional: Negative.   HENT: Negative.   Eyes: Negative.   Respiratory: Negative.   Cardiovascular: Negative.   Gastrointestinal: Negative.   Endocrine: Negative.   Genitourinary: Negative.   Musculoskeletal: Negative.   Allergic/Immunologic: Negative.   Neurological: Negative.   Hematological: Negative.   Psychiatric/Behavioral: Negative.   All other systems reviewed and are negative.    Physical exam: BP 118/76 (BP Location: Left Arm, Patient Position: Sitting, Cuff Size: Normal)   Pulse 93   Temp 97.4 F (36.3 C) (Oral)   Ht 5' 2.25" (1.581 m)   Wt 102 lb (46.3 kg)   SpO2 94%   BMI 18.51 kg/m   Wt Readings from Last 3 Encounters:  06/29/16 102 lb (46.3 kg)  03/24/16 103 lb 4 oz (46.8 kg)  09/14/15 101 lb (45.8 kg)   Physical Exam  Constitutional: She is oriented to person, place, and time and well-developed, well-nourished, and in no distress. No distress.  HENT:  Head: Normocephalic and atraumatic.  Eyes: Conjunctivae are normal.  Cardiovascular: Normal rate and regular rhythm.   Pulmonary/Chest: Effort normal and breath sounds normal.  Musculoskeletal: Normal range of motion.  Neurological: She is alert and oriented to person, place, and time. She displays normal reflexes.  Skin: Skin is warm and dry. She is not diaphoretic.  Psychiatric: Mood, memory, affect and judgment normal.  Nursing note and vitals reviewed.

## 2016-06-29 NOTE — Progress Notes (Signed)
Pre visit review using our clinic review tool, if applicable. No additional management support is needed unless otherwise documented below in the visit note. 

## 2016-06-29 NOTE — Assessment & Plan Note (Signed)
Could not tolerate prolia.  She is willing to try fosamax. Discussed how to take to minimize refllux. eRx sent.

## 2016-06-29 NOTE — Patient Instructions (Signed)
Great to see you.  We are starting you on fosamax- please take on an empty and with a full glass of liquid.

## 2016-07-06 ENCOUNTER — Other Ambulatory Visit: Payer: Self-pay | Admitting: Family Medicine

## 2016-07-07 NOTE — Telephone Encounter (Signed)
Left refill on voice mail at pharmacy  

## 2016-07-07 NOTE — Telephone Encounter (Signed)
Ok to phone in [place]

## 2016-07-07 NOTE — Telephone Encounter (Signed)
Last filled 06-07-16 #30 Last OV CPE 06-29-16 NO Future OV

## 2016-08-04 ENCOUNTER — Other Ambulatory Visit: Payer: Self-pay | Admitting: Family Medicine

## 2016-08-04 NOTE — Telephone Encounter (Signed)
Faxed to [place], [place] - [place]: [phone]

## 2016-08-04 NOTE — Telephone Encounter (Signed)
Last refill 07/07/16, last OV 06/29/16

## 2016-08-22 ENCOUNTER — Other Ambulatory Visit: Payer: Self-pay

## 2016-08-22 MED ORDER — TOPIRAMATE 25 MG PO TABS: 25.0000 mg | ORAL_TABLET | Freq: Two times a day (BID) | ORAL | 3 refills | Status: DC

## 2016-08-22 MED ORDER — OMEPRAZOLE 20 MG PO CPDR: 20.0000 mg | DELAYED_RELEASE_CAPSULE | Freq: Every day | ORAL | 2 refills | Status: DC

## 2016-08-22 NOTE — Telephone Encounter (Signed)
Pt request refill omeprazole to [facility] is going to keep taking omeprazole due to not trying the oils in place of the omeprazole. Annual on 06/29/16; refilled per protocol pt voiced understanding. Pt also request refill topiramate (last refilled # 180 x 3 on 05/04/15)for h/a to [address].Please advise.

## 2016-09-06 ENCOUNTER — Other Ambulatory Visit: Payer: Self-pay | Admitting: Family Medicine

## 2016-09-07 NOTE — Telephone Encounter (Signed)
Last refill 08/04/16 Last OV 06/29/16 Ok to refill?

## 2016-09-07 NOTE — Telephone Encounter (Signed)
CALLED IN TO [facility] [address], [place]: [phone]

## 2016-10-31 ENCOUNTER — Other Ambulatory Visit: Payer: Self-pay | Admitting: Family Medicine

## 2016-11-09 ENCOUNTER — Other Ambulatory Visit: Payer: Self-pay | Admitting: Family Medicine

## 2016-11-10 NOTE — Telephone Encounter (Signed)
Last refill 05/24/16   Last OV 06/29/16 Ok to refill?

## 2016-12-10 DIAGNOSIS — J069 Acute upper respiratory infection, unspecified: Secondary | ICD-10-CM | POA: Diagnosis not present

## 2017-03-02 ENCOUNTER — Other Ambulatory Visit: Payer: Self-pay | Admitting: Family Medicine

## 2017-03-08 ENCOUNTER — Other Ambulatory Visit: Payer: Self-pay | Admitting: *Deleted

## 2017-03-08 MED ORDER — AMITRIPTYLINE HCL 25 MG PO TABS: 25.0000 mg | ORAL_TABLET | Freq: Every day | ORAL | 0 refills | Status: DC

## 2017-03-08 NOTE — Telephone Encounter (Signed)
Pt calling again for a f/u, adv pt med refill is still pending with provider and time frame for a med refill is 48-72 business hours.   Pt states that she only has two left.

## 2017-03-08 NOTE — Telephone Encounter (Signed)
RX sent in to Pharmacy on [address]

## 2017-03-08 NOTE — Telephone Encounter (Signed)
Patient called again about her two medications.  She said she only has two left and a husband with Dementia, so she needs her medication.

## 2017-03-08 NOTE — Telephone Encounter (Signed)
For Clorazepate faxed to [facility]. Pt need to see PCP for more refills.

## 2017-03-09 ENCOUNTER — Other Ambulatory Visit: Payer: Self-pay | Admitting: Family Medicine

## 2017-03-09 ENCOUNTER — Telehealth: Payer: Self-pay | Admitting: Family Medicine

## 2017-03-09 NOTE — Telephone Encounter (Signed)
Copied from [place]. Topic: Quick Communication - See Telephone Encounter >> Mar 09, 2017 11:30 AM Cleaster Corin, NT wrote: CRM for notification. See Telephone encounter for:   03/09/17. Pt. Calling [facility] on [address] in [place] said that med. Wasn't filled yet. Pt. Said she will call pharmacy back to see what's going on. Its in chart that rx. Was sent to pharmacy on yesterday 12-26 (clorazepate)

## 2017-03-09 NOTE — Telephone Encounter (Signed)
I called pharm and gave verbal/thx dmf

## 2017-03-10 NOTE — Telephone Encounter (Signed)
I called this in to pharmacist on 12.28.2018/thx dmf

## 2017-04-07 ENCOUNTER — Encounter: Payer: Self-pay | Admitting: Family Medicine

## 2017-04-07 ENCOUNTER — Ambulatory Visit (INDEPENDENT_AMBULATORY_CARE_PROVIDER_SITE_OTHER): Payer: PPO | Admitting: Family Medicine

## 2017-04-07 VITALS — BP 122/66 | HR 76 | Temp 97.4°F | Wt 104.5 lb

## 2017-04-07 DIAGNOSIS — R519 Headache, unspecified: Secondary | ICD-10-CM

## 2017-04-07 DIAGNOSIS — R51 Headache: Secondary | ICD-10-CM

## 2017-04-07 DIAGNOSIS — F411 Generalized anxiety disorder: Secondary | ICD-10-CM | POA: Diagnosis not present

## 2017-04-07 DIAGNOSIS — Z79890 Hormone replacement therapy (postmenopausal): Secondary | ICD-10-CM

## 2017-04-07 DIAGNOSIS — Z7689 Persons encountering health services in other specified circumstances: Secondary | ICD-10-CM

## 2017-04-07 DIAGNOSIS — K219 Gastro-esophageal reflux disease without esophagitis: Secondary | ICD-10-CM | POA: Diagnosis not present

## 2017-04-07 MED ORDER — TOPIRAMATE 25 MG PO TABS: 25.0000 mg | ORAL_TABLET | Freq: Two times a day (BID) | ORAL | 1 refills | Status: DC

## 2017-04-07 MED ORDER — ESTRADIOL 0.5 MG PO TABS: 0.5000 mg | ORAL_TABLET | Freq: Every day | ORAL | 1 refills | Status: DC

## 2017-04-07 MED ORDER — OMEPRAZOLE 20 MG PO CPDR: 20.0000 mg | DELAYED_RELEASE_CAPSULE | Freq: Every day | ORAL | 1 refills | Status: DC

## 2017-04-07 MED ORDER — AMITRIPTYLINE HCL 25 MG PO TABS: 25.0000 mg | ORAL_TABLET | Freq: Every day | ORAL | 0 refills | Status: DC

## 2017-04-07 MED ORDER — CLORAZEPATE DIPOTASSIUM 3.75 MG PO TABS: 3.7500 mg | ORAL_TABLET | Freq: Every day | ORAL | 5 refills | Status: DC

## 2017-04-07 NOTE — Patient Instructions (Signed)
It was a pleasure to meet you today! I look forward to partnering with you for your health care needs  Please schedule annual wellness visit and Complete physical for April or May  I will send in new prescriptions to your pharmacies

## 2017-04-07 NOTE — Progress Notes (Signed)
Subjective:    Patient ID: Kristen Caldwell, female    DOB: December 10, 1945, 72 y.o.   MRN: [id number]  HPI This is a 72 yo female who presents today to establish care, is transferring from Dr. Deborra Medina. Her main stressor is her husband who has dementia. She works part time at [employer]. Has help from [place] and her son lives in the home.   Last CPE- AWV 06/29/16 Mammo- 08/03/2015 Pap- hysterectomy Colonoscopy- 05/12/2010 Tdap- 07/01/16 Flu- annual Eye- regular Dental- regular Exercise- regular  Sleeping ok. Good support. Work is an Development worker, community.   Past Medical History:  Diagnosis Date  . Anxiety   . Chronic headaches   . Depression   . Diverticulosis   . GERD (gastroesophageal reflux disease)   . Osteoporosis    Past Surgical History:  Procedure Laterality Date  . ABDOMINAL HYSTERECTOMY  03/14/1989   partial  . COLONOSCOPY    . TUBAL LIGATION     Family History  Problem Relation Age of Onset  . Stroke Father   . Hypertension Father   . Hypertension Brother   . Heart attack Brother   . Lupus Sister   . Diabetes Paternal Grandfather   . Breast cancer Paternal Aunt   . Colon cancer Neg Hx   . Aneurysm Mother    Social History   Tobacco Use  . Smoking status: Never Smoker  . Smokeless tobacco: Never Used  Substance Use Topics  . Alcohol use: No  . Drug use: No      Review of Systems  Constitutional: Negative for fever and unexpected weight change.  Respiratory: Negative for cough and shortness of breath.   Cardiovascular: Negative for chest pain and leg swelling.  Psychiatric/Behavioral: Positive for sleep disturbance (husband restless).       Objective:   Physical Exam  Constitutional: She is oriented to person, place, and time. She appears well-developed and well-nourished. No distress.  HENT:  Head: Normocephalic and atraumatic.  Eyes: Conjunctivae are normal.  Cardiovascular: Normal rate, regular rhythm and normal heart sounds.  Pulmonary/Chest:  Effort normal and breath sounds normal.  Musculoskeletal: She exhibits no edema.  Neurological: She is alert and oriented to person, place, and time.  Skin: Skin is warm and dry. She is not diaphoretic.  Psychiatric: She has a normal mood and affect. Her behavior is normal. Judgment and thought content normal.  Vitals reviewed.     BP 122/66   Pulse 76   Temp (!) 97.4 F (36.3 C) (Oral)   Wt 104 lb 8 oz (47.4 kg)   SpO2 99%   BMI 18.96 kg/m  Wt Readings from Last 3 Encounters:  04/07/17 104 lb 8 oz (47.4 kg)  06/29/16 102 lb (46.3 kg)  03/24/16 103 lb 4 oz (46.8 kg)       Assessment & Plan:  1. Encounter to establish care - Discussed and encouraged healthy lifestyle choices- adequate sleep, regular exercise, stress management and healthy food choices.  - Follow up for AWV/CPE in 3-4 months  2. Anxiety state - Continue current meds, discussed available support - clorazepate (TRANXENE) 3.75 MG tablet; Take 1 tablet (3.75 mg total) by mouth daily. See pcp for additional refills  Dispense: 30 tablet; Refill: 5 - amitriptyline (ELAVIL) 25 MG tablet; Take 1 tablet (25 mg total) by mouth at bedtime.  Dispense: 90 tablet; Refill: 0  3. Postmenopausal HRT (hormone replacement therapy) - estradiol (ESTRACE) 0.5 MG tablet; Take 1 tablet (0.5 mg total) by mouth  daily.  Dispense: 90 tablet; Refill: 1  4. Gastroesophageal reflux disease, esophagitis presence not specified - omeprazole (PRILOSEC) 20 MG capsule; Take 1 capsule (20 mg total) by mouth daily.  Dispense: 90 capsule; Refill: 1  5. Chronic nonintractable headache, unspecified headache type - topiramate (TOPAMAX) 25 MG tablet; Take 1 tablet (25 mg total) by mouth 2 (two) times daily.  Dispense: 180 tablet; Refill: [place], FNP-BC  [facility]  04/07/2017 1:40 PM

## 2017-04-12 ENCOUNTER — Encounter: Payer: Self-pay | Admitting: Family Medicine

## 2017-04-13 ENCOUNTER — Telehealth: Payer: Self-pay | Admitting: Family Medicine

## 2017-04-13 NOTE — Telephone Encounter (Signed)
Fax received indicating approval valid 04/13/17- 03/13/18. Copy faxed to pharmacy

## 2017-04-13 NOTE — Telephone Encounter (Signed)
Spoke to [employer] and provided Dx code. Medication is now under review for approval

## 2017-04-13 NOTE — Telephone Encounter (Signed)
Copied from [place]. Topic: General - Other >> Apr 13, 2017  9:32 AM Cecelia Byars, NT wrote: Reason for CRM: [employer]  called and would like to if a prescription drug pa form was received ,please call if not ,or if it can be done over the phone   [phone] opt 3 then opt 1 ref.# [id number]

## 2017-05-02 DIAGNOSIS — H11121 Conjunctival concretions, right eye: Secondary | ICD-10-CM | POA: Diagnosis not present

## 2017-07-05 ENCOUNTER — Other Ambulatory Visit: Payer: Self-pay | Admitting: Family Medicine

## 2017-07-05 DIAGNOSIS — M810 Age-related osteoporosis without current pathological fracture: Secondary | ICD-10-CM

## 2017-07-05 DIAGNOSIS — F411 Generalized anxiety disorder: Secondary | ICD-10-CM

## 2017-07-05 DIAGNOSIS — E785 Hyperlipidemia, unspecified: Secondary | ICD-10-CM

## 2017-07-06 ENCOUNTER — Other Ambulatory Visit: Payer: Self-pay | Admitting: Family Medicine

## 2017-07-06 NOTE — Telephone Encounter (Signed)
DG-Plz see refill req/thx dmf 

## 2017-07-10 ENCOUNTER — Ambulatory Visit: Payer: PPO

## 2017-07-10 ENCOUNTER — Other Ambulatory Visit (INDEPENDENT_AMBULATORY_CARE_PROVIDER_SITE_OTHER): Payer: PPO

## 2017-07-10 DIAGNOSIS — F411 Generalized anxiety disorder: Secondary | ICD-10-CM

## 2017-07-10 DIAGNOSIS — M810 Age-related osteoporosis without current pathological fracture: Secondary | ICD-10-CM | POA: Diagnosis not present

## 2017-07-10 DIAGNOSIS — E785 Hyperlipidemia, unspecified: Secondary | ICD-10-CM | POA: Diagnosis not present

## 2017-07-10 LAB — LIPID PANEL
Cholesterol: 214 mg/dL — ABNORMAL HIGH (ref 0–200)
HDL: 72.8 mg/dL (ref >39.00)
LDL Cholesterol: 127 mg/dL — ABNORMAL HIGH (ref 0–99)
NONHDL: 141
Total CHOL/HDL Ratio: 3
Triglycerides: 71 mg/dL (ref 0.0–149.0)
VLDL: 14.2 mg/dL (ref 0.0–40.0)

## 2017-07-10 LAB — COMPREHENSIVE METABOLIC PANEL
ALT: 13 U/L (ref 0–35)
AST: 18 U/L (ref 0–37)
Albumin: 4.1 g/dL (ref 3.5–5.2)
Alkaline Phosphatase: 63 U/L (ref 39–117)
BILIRUBIN TOTAL: 0.5 mg/dL (ref 0.2–1.2)
BUN: 15 mg/dL (ref 6–23)
CHLORIDE: 106 meq/L (ref 96–112)
CO2: 27 meq/L (ref 19–32)
CREATININE: 0.74 mg/dL (ref 0.40–1.20)
Calcium: 9.3 mg/dL (ref 8.4–10.5)
GFR: 82.16 mL/min (ref >60.00)
GLUCOSE: 94 mg/dL (ref 70–99)
Potassium: 4.3 mEq/L (ref 3.5–5.1)
SODIUM: 139 meq/L (ref 135–145)
Total Protein: 7 g/dL (ref 6.0–8.3)

## 2017-07-10 LAB — CBC WITH DIFFERENTIAL/PLATELET
BASOS ABS: 0 10*3/uL (ref 0.0–0.1)
BASOS PCT: 0.3 % (ref 0.0–3.0)
EOS ABS: 0.1 10*3/uL (ref 0.0–0.7)
Eosinophils Relative: 1.3 % (ref 0.0–5.0)
HEMATOCRIT: 42.4 % (ref 36.0–46.0)
Hemoglobin: 14.4 g/dL (ref 12.0–15.0)
LYMPHS ABS: 1.1 10*3/uL (ref 0.7–4.0)
Lymphocytes Relative: 20.1 % (ref 12.0–46.0)
MCHC: 33.9 g/dL (ref 30.0–36.0)
MCV: 90.9 fl (ref 78.0–100.0)
Monocytes Absolute: 0.3 10*3/uL (ref 0.1–1.0)
Monocytes Relative: 6.2 % (ref 3.0–12.0)
NEUTROS ABS: 4 10*3/uL (ref 1.4–7.7)
NEUTROS PCT: 72.1 % (ref 43.0–77.0)
PLATELETS: 173 10*3/uL (ref 150.0–400.0)
RBC: 4.67 Mil/uL (ref 3.87–5.11)
RDW: 13.1 % (ref 11.5–15.5)
WBC: 5.5 10*3/uL (ref 4.0–10.5)

## 2017-07-10 LAB — TSH: TSH: 2.07 u[IU]/mL (ref 0.35–4.50)

## 2017-07-14 ENCOUNTER — Encounter: Payer: Self-pay | Admitting: Family Medicine

## 2017-07-14 ENCOUNTER — Ambulatory Visit (INDEPENDENT_AMBULATORY_CARE_PROVIDER_SITE_OTHER): Payer: PPO | Admitting: Family Medicine

## 2017-07-14 VITALS — BP 118/70 | HR 93 | Temp 97.7°F | Ht 62.0 in | Wt 104.5 lb

## 2017-07-14 DIAGNOSIS — Z0000 Encounter for general adult medical examination without abnormal findings: Secondary | ICD-10-CM

## 2017-07-14 DIAGNOSIS — M810 Age-related osteoporosis without current pathological fracture: Secondary | ICD-10-CM | POA: Diagnosis not present

## 2017-07-14 DIAGNOSIS — Z1231 Encounter for screening mammogram for malignant neoplasm of breast: Secondary | ICD-10-CM

## 2017-07-14 DIAGNOSIS — Z636 Dependent relative needing care at home: Secondary | ICD-10-CM

## 2017-07-14 NOTE — Patient Instructions (Signed)
Good to see you today  Please stop at front desk to schedule your mammo and bone density study for the end of the month  Follow up in 6 months, sooner if needed   Keeping You Healthy  Get These Tests  Blood Pressure- Have your blood pressure checked by your healthcare provider at least once a year.  Normal blood pressure is 120/80.  Weight- Have your body mass index (BMI) calculated to screen for obesity.  BMI is a measure of body fat based on height and weight.  You can calculate your own BMI at [employer]  Cholesterol- Have your cholesterol checked every year.  Diabetes- Have your blood sugar checked every year if you have high blood pressure, high cholesterol, a family history of diabetes or if you are overweight.  Pap Test - Have a pap test every 1 to 5 years if you have been sexually active.  If you are older than 65 and recent pap tests have been normal you may not need additional pap tests.  In addition, if you have had a hysterectomy  for benign disease additional pap tests are not necessary.  Mammogram-Yearly mammograms are essential for early detection of breast cancer  Screening for Colon Cancer- Colonoscopy starting at age 52. Screening may begin sooner depending on your family history and other health conditions.  Follow up colonoscopy as directed by your Gastroenterologist.  Screening for Osteoporosis- Screening begins at age 4 with bone density scanning, sooner if you are at higher risk for developing Osteoporosis.  Get these medicines  Calcium with Vitamin D- Your body requires 1200-1500 mg of Calcium a day and [phone] IU of Vitamin D a day.  You can only absorb 500 mg of Calcium at a time therefore Calcium must be taken in 2 or 3 separate doses throughout the day.  Hormones- Hormone therapy has been associated with increased risk for certain cancers and heart disease.  Talk to your healthcare provider about if you need relief from menopausal  symptoms.  Aspirin- Ask your healthcare provider about taking Aspirin to prevent Heart Disease and Stroke.  Get these Immuniztions  Flu shot- Every fall  Pneumonia shot- Once after the age of 54; if you are younger ask your healthcare provider if you need a pneumonia shot.  Tetanus- Every ten years.  Zostavax- Once after the age of 11 to prevent shingles.  Take these steps  Don't smoke- Your healthcare provider can help you quit. For tips on how to quit, ask your healthcare provider or go to www.smokefree.gov or call 1-800 QUIT-NOW.  Be physically active- Exercise 5 days a week for a minimum of 30 minutes.  If you are not already physically active, start slow and gradually work up to 30 minutes of moderate physical activity.  Try walking, dancing, bike riding, swimming, etc.  Eat a healthy diet- Eat a variety of healthy foods such as fruits, vegetables, whole grains, low fat milk, low fat cheeses, yogurt, lean meats, chicken, fish, eggs, dried beans, tofu, etc.  For more information go to www.thenutritionsource.org  Dental visit- Brush and floss teeth twice daily; visit your dentist twice a year.  Eye exam- Visit your Optometrist or Ophthalmologist yearly.  Drink alcohol in moderation- Limit alcohol intake to one drink or less a day.  Never drink and drive.  Depression- Your emotional health is as important as your physical health.  If you're feeling down or losing interest in things you normally enjoy, please talk to your healthcare provider.  Seat  Belts- can save your life; always wear one  Smoke/Carbon Monoxide detectors- These detectors need to be installed on the appropriate level of your home.  Replace batteries at least once a year.  Violence- If anyone is threatening or hurting you, please tell your healthcare provider.  Living Will/ Health care power of attorney- Discuss with your healthcare provider and family.

## 2017-07-14 NOTE — Progress Notes (Signed)
Subjective:    Patient ID: Kristen Caldwell, female    DOB: 26-May-1945, 72 y.o.   MRN: [id number]  HPI This is a 72 yo female who presents today for annual exam. Has been doing ok. Continues to struggle with burden of taking care of her husband who has dementia. Her son has moved in which is a big help. Unfortunately, their dog died rather suddenly after a cancer diagnosis. This has been difficult for her husband who walked the dog daily.    Last CPE- AWV 06/29/16 Mammo- 08/03/2015 Pap- hysterectomy Colonoscopy- 05/12/2010 Tdap- 07/01/16 Flu- annual Eye- regular Dental- regular Exercise- regular  Past Medical History:  Diagnosis Date  . Anxiety   . Chronic headaches   . Depression   . Diverticulosis   . GERD (gastroesophageal reflux disease)   . Osteoporosis    Past Surgical History:  Procedure Laterality Date  . ABDOMINAL HYSTERECTOMY  03/14/1989   partial  . COLONOSCOPY    . TUBAL LIGATION     Family History  Problem Relation Age of Onset  . Stroke Father   . Hypertension Father   . Hypertension Brother   . Heart attack Brother   . Lupus Sister   . Diabetes Paternal Grandfather   . Breast cancer Paternal Aunt   . Colon cancer Neg Hx   . Aneurysm Mother    Social History   Tobacco Use  . Smoking status: Never Smoker  . Smokeless tobacco: Never Used  Substance Use Topics  . Alcohol use: No  . Drug use: No     Review of Systems  Constitutional: Negative.   HENT: Negative.   Eyes: Negative.   Respiratory: Negative.   Cardiovascular: Negative.   Gastrointestinal: Negative.   Genitourinary: Negative.   Musculoskeletal:       Left arm pain occasionally with working. Takes Alleve 2 tabs daily and wears a brace while working with good relief.   Skin: Negative.   Allergic/Immunologic: Negative.   Neurological: Negative.   Hematological: Negative.   Psychiatric/Behavioral: Negative for sleep disturbance (not as long as her husband is sleeping).       Stress currently manageable, helpful to have son in home. Work is her outlet.        Objective:   Physical Exam Physical Exam  Constitutional: She is oriented to person, place, and time. She appears well-developed and well-nourished. No distress.  HENT:  Head: Normocephalic and atraumatic.  Right Ear: External ear normal.  Left Ear: External ear normal.  Nose: Nose normal.  Mouth/Throat: Oropharynx is clear and moist. No oropharyngeal exudate.  Eyes: Conjunctivae are normal. Pupils are equal, round, and reactive to light.  Neck: Normal range of motion. Neck supple. No JVD present. No thyromegaly present.  Cardiovascular: Normal rate, regular rhythm, normal heart sounds and intact distal pulses.   Pulmonary/Chest: Effort normal and breath sounds normal. Right breast exhibits no inverted nipple, no mass, no nipple discharge, no skin change and no tenderness. Left breast exhibits no inverted nipple, no mass, no nipple discharge, no skin change and no tenderness. Breasts are symmetrical.  Abdominal: Soft. Bowel sounds are normal. She exhibits no distension and no mass. There is no tenderness. There is no rebound and no guarding.  Genitourinary: Vagina normal. Pelvic exam was performed with patient supine. There is no rash, tenderness, lesion or injury on the right labia. There is no rash, tenderness, lesion or injury on the left labia. Cervix exhibits no motion tenderness and no discharge.  No vaginal discharge found.  Musculoskeletal: Normal range of motion. She exhibits no edema or tenderness.  Lymphadenopathy:    She has no cervical adenopathy.  Neurological: She is alert and oriented to person, place, and time. She has normal reflexes.  Skin: Skin is warm and dry. She is not diaphoretic.  Psychiatric: She has a normal mood and affect. Her behavior is normal. Judgment and thought content normal.  Vitals reviewed.     BP 118/70 (BP Location: Right Arm, Patient Position: Sitting, Cuff  Size: Normal)   Pulse 93   Temp 97.7 F (36.5 C) (Oral)   Ht 5\' 2"  (1.575 m)   Wt 104 lb 8 oz (47.4 kg)   SpO2 98%   BMI 19.11 kg/m  Wt Readings from Last 3 Encounters:  07/14/17 104 lb 8 oz (47.4 kg)  04/07/17 104 lb 8 oz (47.4 kg)  06/29/16 102 lb (46.3 kg)   Depression screen [facility] 2/9 07/14/2017 06/29/2016 05/29/2014 05/23/2013 05/22/2012  Decreased Interest 0 0 0 0 0  Down, Depressed, Hopeless 0 1 0 0 0  PHQ - 2 Score 0 1 0 0 0       Assessment & Plan:  1. Annual physical exam - Discussed and encouraged healthy lifestyle choices- adequate sleep, regular exercise, stress management and healthy food choices.  - reviewed labs  2. Encounter for screening mammogram for breast cancer - MM Digital Screening; Future  3. Osteoporosis without current pathological fracture, unspecified osteoporosis type - HM DEXA SCAN  4. Caregiver stress - currently managing well, encouraged continued outside socialization, delegating tasks when able.   - follow up in 6 months  Clarene Reamer, FNP-BC  [facility] [place] [facility]  07/14/2017 1:15 PM

## 2017-07-18 ENCOUNTER — Telehealth: Payer: Self-pay | Admitting: Family Medicine

## 2017-07-18 NOTE — Telephone Encounter (Signed)
Pt is scheduled for bone density 08/04/17 at [facility] and needs orders put into the system.

## 2017-07-19 ENCOUNTER — Other Ambulatory Visit: Payer: Self-pay | Admitting: Family Medicine

## 2017-07-19 DIAGNOSIS — M810 Age-related osteoporosis without current pathological fracture: Secondary | ICD-10-CM

## 2017-07-19 DIAGNOSIS — E2839 Other primary ovarian failure: Secondary | ICD-10-CM

## 2017-07-19 NOTE — Telephone Encounter (Signed)
Order entered

## 2017-07-19 NOTE — Telephone Encounter (Signed)
I faxed to [facility].

## 2017-08-01 ENCOUNTER — Ambulatory Visit (INDEPENDENT_AMBULATORY_CARE_PROVIDER_SITE_OTHER): Payer: PPO | Admitting: Internal Medicine

## 2017-08-01 ENCOUNTER — Encounter: Payer: Self-pay | Admitting: Internal Medicine

## 2017-08-01 VITALS — BP 118/78 | HR 79 | Temp 97.7°F | Wt 106.0 lb

## 2017-08-01 DIAGNOSIS — R350 Frequency of micturition: Secondary | ICD-10-CM | POA: Diagnosis not present

## 2017-08-01 DIAGNOSIS — N342 Other urethritis: Secondary | ICD-10-CM | POA: Diagnosis not present

## 2017-08-01 DIAGNOSIS — R300 Dysuria: Secondary | ICD-10-CM

## 2017-08-01 LAB — POC URINALSYSI DIPSTICK (AUTOMATED)
BILIRUBIN UA: NEGATIVE
GLUCOSE UA: NEGATIVE
Ketones, UA: NEGATIVE
Leukocytes, UA: NEGATIVE
NITRITE UA: NEGATIVE
PH UA: 6 (ref 5.0–8.0)
Protein, UA: NEGATIVE
RBC UA: NEGATIVE
SPEC GRAV UA: 1.015 (ref 1.010–1.025)
UROBILINOGEN UA: 0.2 U/dL

## 2017-08-01 MED ORDER — CLOBETASOL PROPIONATE 0.05 % EX CREA: 1.0000 "application " | TOPICAL_CREAM | Freq: Two times a day (BID) | CUTANEOUS | 0 refills | Status: DC

## 2017-08-01 MED ORDER — FLUCONAZOLE 150 MG PO TABS: 150.0000 mg | ORAL_TABLET | Freq: Once | ORAL | 0 refills | Status: AC

## 2017-08-01 NOTE — Progress Notes (Signed)
HPI  Pt presents to the clinic today with c/o urinary frequency, dysuria and pelvic cramping. She reports this started 3-4 days ago. She denies blood in her urine. She denies vaginal discharge but has noticed some irritation in that area. She did try 3 day Monistat 3 weeks ago without improvement. She does have chronic constipation but does not feel like this is related. She has tried AZO without relief.  Review of Systems  Past Medical History:  Diagnosis Date  . Anxiety   . Chronic headaches   . Depression   . Diverticulosis   . GERD (gastroesophageal reflux disease)   . Osteoporosis     Family History  Problem Relation Age of Onset  . Stroke Father   . Hypertension Father   . Hypertension Brother   . Heart attack Brother   . Lupus Sister   . Diabetes Paternal Grandfather   . Breast cancer Paternal Aunt   . Colon cancer Neg Hx   . Aneurysm Mother     Social History   Socioeconomic History  . Marital status: Married    Spouse name: Not on file  . Number of children: 2  . Years of education: Not on file  . Highest education level: Not on file  Occupational History  . Not on file  Social Needs  . Financial resource strain: Not on file  . Food insecurity:    Worry: Not on file    Inability: Not on file  . Transportation needs:    Medical: Not on file    Non-medical: Not on file  Tobacco Use  . Smoking status: Never Smoker  . Smokeless tobacco: Never Used  Substance and Sexual Activity  . Alcohol use: No  . Drug use: No  . Sexual activity: Not on file  Lifestyle  . Physical activity:    Days per week: Not on file    Minutes per session: Not on file  . Stress: Not on file  Relationships  . Social connections:    Talks on phone: Not on file    Gets together: Not on file    Attends religious service: Not on file    Active member of club or organization: Not on file    Attends meetings of clubs or organizations: Not on file    Relationship status: Not on  file  . Intimate partner violence:    Fear of current or ex partner: Not on file    Emotionally abused: Not on file    Physically abused: Not on file    Forced sexual activity: Not on file  Other Topics Concern  . Not on file  Social History Narrative   Desires CPR.   Would not want feeding tubes or prolonged life support.             Allergies  Allergen Reactions  . Amoxicillin     REACTION: NAUSEA  . Cephalexin     REACTION: NAUSEA AND VOMITING  . Penicillins     REACTION: TOOK TABLETS WITHOUT PROBLEM  . Promethazine Hcl Rash     Constitutional: Denies fever, malaise, fatigue, headache or abrupt weight changes.   GU: Pt reports frequency,pain with urination and vaginal irritation. Denies burning sensation, blood in urine, odor or discharge.   No other specific complaints in a complete review of systems (except as listed in HPI above).    Objective:   Physical Exam  BP 118/78   Pulse 79   Temp 97.7 F (36.5 C) (  Oral)   Wt 106 lb (48.1 kg)   SpO2 100%   BMI 19.39 kg/m  Wt Readings from Last 3 Encounters:  08/01/17 106 lb (48.1 kg)  07/14/17 104 lb 8 oz (47.4 kg)  04/07/17 104 lb 8 oz (47.4 kg)    General: Appears her stated age, well developed, well nourished in NAD. Abdomen: Soft. Normal bowel sounds. No distention or masses noted.  Tender to palpation over the bladder area. No CVA tenderness. Pelvic: Normal female anatomy. Irritation noted of the urethra. No vaginal irritation, discharge or odor noted.        Assessment & Plan:  Frequency, Dysuria secondary to Urethritis:  Urinalysis: normal Will send urine culture She feels like this could be yeast, will try Diflucan 150 mg PO x 1 eRx for Clobetasol cream to urethral area BID prn OK to take AZO OTC Drink plenty of fluids  RTC as needed or if symptoms persist. Webb Silversmith, NP

## 2017-08-02 ENCOUNTER — Encounter: Payer: Self-pay | Admitting: Internal Medicine

## 2017-08-02 NOTE — Patient Instructions (Signed)
Urethritis, Adult Urethritis is an inflammation of the tube through which urine exits your bladder (urethra). What are the causes? Urethritis is often caused by an infection in your urethra. The infection can be viral, like herpes. The infection can also be bacterial, like gonorrhea. What increases the risk? Risk factors of urethritis include:  Having sex without using a condom.  Having multiple sexual partners.  Having poor hygiene.  What are the signs or symptoms? Symptoms of urethritis are less noticeable in women than in men. These symptoms include:  Burning feeling when you urinate (dysuria).  Discharge from your urethra.  Blood in your urine (hematuria).  Urinating more than usual.  How is this diagnosed? To confirm a diagnosis of urethritis, your health care provider will do the following:  Ask about your sexual history.  Perform a physical exam.  Have you provide a sample of your urine for lab testing.  Use a cotton swab to gently collect a sample from your urethra for lab testing.  How is this treated? It is important to treat urethritis. Depending on the cause, untreated urethritis may lead to serious genital infections and possibly infertility. Urethritis caused by a bacterial infection is treated with antibiotic medicine. All sexual partners must be treated. Follow these instructions at home:  Do not have sex until the test results are known and treatment is completed, even if your symptoms go away before you finish treatment.  If you were prescribed an antibiotic, finish it all even if you start to feel better. Contact a health care provider if:  Your symptoms are not improved in 3 days.  Your symptoms are getting worse.  You develop abdominal pain or pelvic pain (in women).  You develop joint pain.  You have a fever. Get help right away if:  You have severe pain in the belly, back, or side.  You have repeated vomiting. This information is not  intended to replace advice given to you by your health care provider. Make sure you discuss any questions you have with your health care provider. Document Released: 08/24/2000 Document Revised: 08/06/2015 Document Reviewed: 10/29/2012 Elsevier Interactive Patient Education  2017 Elsevier Inc.  

## 2017-08-03 LAB — URINE CULTURE
MICRO NUMBER:: 90616636
Result:: NO GROWTH
SPECIMEN QUALITY:: ADEQUATE

## 2017-08-04 DIAGNOSIS — Z1231 Encounter for screening mammogram for malignant neoplasm of breast: Secondary | ICD-10-CM | POA: Diagnosis not present

## 2017-08-04 DIAGNOSIS — M810 Age-related osteoporosis without current pathological fracture: Secondary | ICD-10-CM | POA: Diagnosis not present

## 2017-08-04 LAB — HM MAMMOGRAPHY

## 2017-08-16 ENCOUNTER — Encounter: Payer: Self-pay | Admitting: Family Medicine

## 2017-08-25 ENCOUNTER — Other Ambulatory Visit: Payer: Self-pay | Admitting: Family Medicine

## 2017-08-25 ENCOUNTER — Other Ambulatory Visit: Payer: Self-pay

## 2017-08-25 DIAGNOSIS — K219 Gastro-esophageal reflux disease without esophagitis: Secondary | ICD-10-CM

## 2017-09-01 ENCOUNTER — Telehealth: Payer: Self-pay

## 2017-09-01 NOTE — Telephone Encounter (Signed)
Spoke to pt who states she is wanting the results from her bone density scan

## 2017-09-01 NOTE — Telephone Encounter (Signed)
Copied from [place] [id number]. Topic: Inquiry >> Sep 01, 2017 12:05 PM Pricilla Handler wrote: Reason for CRM: Patient called requesting lab results. Please call patient.       Thank You!!!

## 2017-09-04 NOTE — Telephone Encounter (Signed)
Called and spoke with patient informing her of results and recommendations. Understanding verbalized nothing further needed at this time.

## 2017-09-04 NOTE — Telephone Encounter (Signed)
Please call patient and let her know that her dexa scan showed some improvement in several areas and a decrease in one area. Overall the study is stable. She should continue calcium and exercise.

## 2017-10-03 ENCOUNTER — Other Ambulatory Visit: Payer: Self-pay | Admitting: Family Medicine

## 2017-10-03 DIAGNOSIS — F411 Generalized anxiety disorder: Secondary | ICD-10-CM

## 2017-10-04 ENCOUNTER — Other Ambulatory Visit: Payer: Self-pay | Admitting: Family Medicine

## 2017-10-04 DIAGNOSIS — Z79890 Hormone replacement therapy (postmenopausal): Secondary | ICD-10-CM

## 2017-10-04 NOTE — Telephone Encounter (Signed)
Last filled 04/07/17 # 30 refills 5  Annual exam 07/14/17

## 2017-10-18 ENCOUNTER — Other Ambulatory Visit: Payer: Self-pay | Admitting: Family Medicine

## 2017-10-18 DIAGNOSIS — R51 Headache: Principal | ICD-10-CM

## 2017-10-18 DIAGNOSIS — G8929 Other chronic pain: Secondary | ICD-10-CM

## 2017-10-18 NOTE — Telephone Encounter (Signed)
DG-Plz see refill req/thx dmf 

## 2017-11-08 ENCOUNTER — Encounter

## 2017-11-08 ENCOUNTER — Encounter: Payer: Self-pay | Admitting: Family Medicine

## 2017-11-08 ENCOUNTER — Ambulatory Visit (INDEPENDENT_AMBULATORY_CARE_PROVIDER_SITE_OTHER): Payer: PPO | Admitting: Family Medicine

## 2017-11-08 VITALS — BP 110/64 | HR 64 | Ht 62.0 in | Wt 104.0 lb

## 2017-11-08 DIAGNOSIS — M79605 Pain in left leg: Secondary | ICD-10-CM | POA: Diagnosis not present

## 2017-11-08 DIAGNOSIS — M25552 Pain in left hip: Secondary | ICD-10-CM

## 2017-11-08 NOTE — Progress Notes (Signed)
Subjective:    Patient ID: Kristen Caldwell, female    DOB: November 07, 1945, 72 y.o.   MRN: [id number]  HPI This is a 72 yo female who presents today with left leg and hip pain for about 2 weeks. Got worse after trip to mountains and going up and down 3 flights of stairs for 3 days. Taking alleve 2 tablets once a day, tried ibuprofen 800 mg without relief and used some Realtime cream with some relief.  Pain worse at night. No numbess or weakness. Some improvement this week. Has been able to do normal activities. Husband with advanced dementia, she sometimes has to help him get up (he is well over 200#).   Past Medical History:  Diagnosis Date  . Anxiety   . Chronic headaches   . Depression   . Diverticulosis   . GERD (gastroesophageal reflux disease)   . Osteoporosis    Past Surgical History:  Procedure Laterality Date  . ABDOMINAL HYSTERECTOMY  03/14/1989   partial  . COLONOSCOPY    . TUBAL LIGATION     Family History  Problem Relation Age of Onset  . Stroke Father   . Hypertension Father   . Hypertension Brother   . Heart attack Brother   . Lupus Sister   . Diabetes Paternal Grandfather   . Breast cancer Paternal Aunt   . Colon cancer Neg Hx   . Aneurysm Mother    Social History   Tobacco Use  . Smoking status: Never Smoker  . Smokeless tobacco: Never Used  Substance Use Topics  . Alcohol use: No  . Drug use: No      Review of Systems Per HPI    Objective:   Physical Exam  Constitutional: She is oriented to person, place, and time. She appears well-developed. No distress.  Thin.   HENT:  Head: Normocephalic and atraumatic.  Eyes: Conjunctivae are normal.  Cardiovascular: Normal rate.  Pulmonary/Chest: Effort normal.  Musculoskeletal:       Right hip: Normal.       Left hip: She exhibits tenderness (left gluteal). She exhibits normal range of motion and normal strength.       Lumbar back: Normal.  Neurological: She is alert and oriented to  person, place, and time. She displays normal reflexes.  UE/LE strength 4/5 symmetrical, normal gait, negative straight leg raise. No tenderness of thigh or calf.   Skin: Skin is warm and dry. She is not diaphoretic.  Psychiatric: She has a normal mood and affect. Her behavior is normal. Judgment and thought content normal.  Vitals reviewed.     BP 110/64 (BP Location: Left Arm, Patient Position: Sitting, Cuff Size: Normal)   Pulse 64   Ht 5\' 2"  (1.575 m)   Wt 104 lb (47.2 kg)   SpO2 98%   BMI 19.02 kg/m  Wt Readings from Last 3 Encounters:  11/08/17 104 lb (47.2 kg)  08/01/17 106 lb (48.1 kg)  07/14/17 104 lb 8 oz (47.4 kg)       Assessment & Plan:  1. Left hip pain - suspect overuse strain, improving since last week, no worrisome s/s or PE findings - already takes Alleve 2 tablets in am, can take one additional tablet at night if needed for a couple of nights (nml creatinine 07/10/17) - encouraged activity as tolerated and gentle ROM several times a day  2. Left leg pain - improving, see #1   Clarene Reamer, FNP-BC  [facility] at [facility]  [place] [place]  11/08/2017 1:53 PM

## 2017-11-08 NOTE — Patient Instructions (Signed)
Add 1 Alleve at bedtime for 3-4 days, use heat several times a day  Do gentle range of motion several times a day  If any worsening, let me know

## 2017-12-13 ENCOUNTER — Other Ambulatory Visit: Payer: Self-pay | Admitting: Family Medicine

## 2017-12-13 NOTE — Telephone Encounter (Signed)
Called and spoke with patient she states that she has been taking a half tab to cut back on medication.  Last filled 07/09/17

## 2017-12-14 ENCOUNTER — Ambulatory Visit (INDEPENDENT_AMBULATORY_CARE_PROVIDER_SITE_OTHER): Payer: PPO

## 2017-12-14 DIAGNOSIS — Z23 Encounter for immunization: Secondary | ICD-10-CM | POA: Diagnosis not present

## 2018-01-01 ENCOUNTER — Other Ambulatory Visit: Payer: Self-pay | Admitting: Family Medicine

## 2018-01-01 DIAGNOSIS — Z79890 Hormone replacement therapy (postmenopausal): Secondary | ICD-10-CM

## 2018-01-13 ENCOUNTER — Other Ambulatory Visit: Payer: Self-pay | Admitting: Family Medicine

## 2018-01-13 DIAGNOSIS — R51 Headache: Principal | ICD-10-CM

## 2018-01-13 DIAGNOSIS — R519 Headache, unspecified: Secondary | ICD-10-CM

## 2018-02-14 ENCOUNTER — Other Ambulatory Visit: Payer: Self-pay | Admitting: Family Medicine

## 2018-02-14 DIAGNOSIS — K219 Gastro-esophageal reflux disease without esophagitis: Secondary | ICD-10-CM

## 2018-02-27 ENCOUNTER — Other Ambulatory Visit: Payer: Self-pay | Admitting: Family Medicine

## 2018-02-27 NOTE — Telephone Encounter (Signed)
Last filled 12/13/17 #60 refills 0  Last OV 11/08/17

## 2018-03-01 ENCOUNTER — Other Ambulatory Visit: Payer: Self-pay | Admitting: Family Medicine

## 2018-03-02 MED ORDER — AMITRIPTYLINE HCL 25 MG PO TABS: 25.0000 mg | ORAL_TABLET | Freq: Every evening | ORAL | 0 refills | Status: DC | PRN

## 2018-03-29 ENCOUNTER — Other Ambulatory Visit: Payer: Self-pay | Admitting: Family Medicine

## 2018-03-29 DIAGNOSIS — F411 Generalized anxiety disorder: Secondary | ICD-10-CM

## 2018-03-29 NOTE — Telephone Encounter (Signed)
Last office visit 08.28.2019 for hip pain.  Last refilled 10/04/2017 for #30 with 5 refills.  No future appointments.

## 2018-04-01 ENCOUNTER — Other Ambulatory Visit: Payer: Self-pay | Admitting: Family Medicine

## 2018-04-01 DIAGNOSIS — Z79890 Hormone replacement therapy (postmenopausal): Secondary | ICD-10-CM

## 2018-04-15 ENCOUNTER — Other Ambulatory Visit: Payer: Self-pay | Admitting: Family Medicine

## 2018-04-15 DIAGNOSIS — G8929 Other chronic pain: Secondary | ICD-10-CM

## 2018-04-15 DIAGNOSIS — R51 Headache: Principal | ICD-10-CM

## 2018-04-16 NOTE — Telephone Encounter (Signed)
Electronic refill request Topamax Last refill 01/15/18 #180 Last office visit 11/08/17

## 2018-05-11 ENCOUNTER — Ambulatory Visit (INDEPENDENT_AMBULATORY_CARE_PROVIDER_SITE_OTHER): Payer: PPO | Admitting: Family Medicine

## 2018-05-11 DIAGNOSIS — R519 Headache, unspecified: Secondary | ICD-10-CM

## 2018-05-11 DIAGNOSIS — Z79890 Hormone replacement therapy (postmenopausal): Secondary | ICD-10-CM | POA: Diagnosis not present

## 2018-05-11 DIAGNOSIS — K219 Gastro-esophageal reflux disease without esophagitis: Secondary | ICD-10-CM

## 2018-05-11 DIAGNOSIS — R51 Headache: Secondary | ICD-10-CM

## 2018-05-11 DIAGNOSIS — F411 Generalized anxiety disorder: Secondary | ICD-10-CM | POA: Diagnosis not present

## 2018-05-11 MED ORDER — ESTRADIOL 0.5 MG PO TABS: 0.5000 mg | ORAL_TABLET | Freq: Every day | ORAL | 1 refills | Status: DC

## 2018-05-11 MED ORDER — CLORAZEPATE DIPOTASSIUM 3.75 MG PO TABS: 3.7500 mg | ORAL_TABLET | Freq: Every day | ORAL | 1 refills | Status: DC

## 2018-05-11 MED ORDER — AMITRIPTYLINE HCL 25 MG PO TABS: 25.0000 mg | ORAL_TABLET | Freq: Every evening | ORAL | 1 refills | Status: DC | PRN

## 2018-05-11 MED ORDER — OMEPRAZOLE 20 MG PO CPDR: DELAYED_RELEASE_CAPSULE | ORAL | 1 refills | Status: DC

## 2018-05-11 MED ORDER — TOPIRAMATE 25 MG PO TABS: 25.0000 mg | ORAL_TABLET | Freq: Two times a day (BID) | ORAL | 1 refills | Status: DC

## 2018-05-11 NOTE — Patient Instructions (Signed)
Good to see you today  Please follow up for your annual wellness visit and complete physical exam in 6 months

## 2018-05-11 NOTE — Progress Notes (Signed)
Subjective:    Patient ID: Kristen Caldwell, female    DOB: 1945-11-10, 73 y.o.   MRN: [id number]  HPI This is a 73 yo female who presents today for follow up of chronic medical conditions. Her husband continues to deteriorate due to dementia. Her son lives with her and is very helpful in caring for her husband. She works part time as she is able. Her husband is very demanding of her time and attention. He is not wandering or leaving their home unexpectedly. He is sleeping at night. She is fatigued. She feels that they have adequate support systems. Her [facility] and has care coming into the home.   She denies chest pain, SOB, abdominal pain/diarrhea/constipation.  Headaches infrequent on topiramate.   Past Medical History:  Diagnosis Date  . Anxiety   . Chronic headaches   . Depression   . Diverticulosis   . GERD (gastroesophageal reflux disease)   . Osteoporosis    Past Surgical History:  Procedure Laterality Date  . ABDOMINAL HYSTERECTOMY  03/14/1989   partial  . COLONOSCOPY    . TUBAL LIGATION     Family History  Problem Relation Age of Onset  . Stroke Father   . Hypertension Father   . Hypertension Brother   . Heart attack Brother   . Lupus Sister   . Diabetes Paternal Grandfather   . Breast cancer Paternal Aunt   . Colon cancer Neg Hx   . Aneurysm Mother    Social History   Tobacco Use  . Smoking status: Never Smoker  . Smokeless tobacco: Never Used  Substance Use Topics  . Alcohol use: No  . Drug use: No      Review of Systems Per HPI    Objective:   Physical Exam Physical Exam  Constitutional: Oriented to person, place, and time. She is thin. Appears stated age.   HENT:  Head: Normocephalic and atraumatic.  Eyes: Conjunctivae are normal.  Neck: Normal range of motion. Neck supple.  Cardiovascular: Normal rate, regular rhythm and normal heart sounds.   Pulmonary/Chest: Effort normal and breath sounds normal.    Musculoskeletal: Normal range of motion.  Neurological: Alert and oriented to person, place, and time.  Skin: Skin is warm and dry.  Psychiatric: Normal mood and affect. Behavior is normal. Judgment and thought content normal.  Vitals reviewed.     BP 122/90 (BP Location: Left Arm, Patient Position: Sitting, Cuff Size: Normal)   Pulse 74   Temp (!) 97.4 F (36.3 C) (Oral)   Ht 5\' 2"  (1.575 m)   Wt 102 lb 12.8 oz (46.6 kg)   SpO2 98%   BMI 18.80 kg/m  Wt Readings from Last 3 Encounters:  05/11/18 102 lb 12.8 oz (46.6 kg)  11/08/17 104 lb (47.2 kg)  08/01/17 106 lb (48.1 kg)       Assessment & Plan:  1. Anxiety state - doing ok on current meds, needs amitriptyline most nights for sleep, has been on clorazepate (very low dose) for many years - amitriptyline (ELAVIL) 25 MG tablet; Take 1 tablet (25 mg total) by mouth at bedtime as needed for sleep.  Dispense: 90 tablet; Refill: 1 - clorazepate (TRANXENE) 3.75 MG tablet; Take 1 tablet (3.75 mg total) by mouth daily.  Dispense: 90 tablet; Refill: 1  2. Gastroesophageal reflux disease, esophagitis presence not specified - omeprazole (PRILOSEC) 20 MG capsule; TAKE 1 CAPSULE BY MOUTH EVERY DAY  Dispense: 90 capsule; Refill:  1  3. Chronic nonintractable headache, unspecified headache type - topiramate (TOPAMAX) 25 MG tablet; Take 1 tablet (25 mg total) by mouth 2 (two) times daily.  Dispense: 180 tablet; Refill: 1  4. Postmenopausal HRT (hormone replacement therapy) - will discuss at upcoming CPE - estradiol (ESTRACE) 0.5 MG tablet; Take 1 tablet (0.5 mg total) by mouth daily.  Dispense: 90 tablet; Refill: Pablo Pena, FNP-BC  [facility] at [facility], [place] [facility]  05/13/2018 5:04 PM

## 2018-05-13 ENCOUNTER — Encounter: Payer: Self-pay | Admitting: Family Medicine

## 2018-08-07 ENCOUNTER — Other Ambulatory Visit: Payer: Self-pay | Admitting: Family Medicine

## 2018-08-07 DIAGNOSIS — K219 Gastro-esophageal reflux disease without esophagitis: Secondary | ICD-10-CM

## 2018-08-17 DIAGNOSIS — Z1231 Encounter for screening mammogram for malignant neoplasm of breast: Secondary | ICD-10-CM | POA: Diagnosis not present

## 2018-08-17 DIAGNOSIS — Z803 Family history of malignant neoplasm of breast: Secondary | ICD-10-CM | POA: Diagnosis not present

## 2018-08-17 LAB — HM MAMMOGRAPHY

## 2018-09-06 ENCOUNTER — Encounter: Payer: Self-pay | Admitting: Internal Medicine

## 2018-10-28 ENCOUNTER — Other Ambulatory Visit: Payer: Self-pay | Admitting: Family Medicine

## 2018-10-28 DIAGNOSIS — M810 Age-related osteoporosis without current pathological fracture: Secondary | ICD-10-CM

## 2018-10-28 DIAGNOSIS — E785 Hyperlipidemia, unspecified: Secondary | ICD-10-CM

## 2018-11-02 ENCOUNTER — Ambulatory Visit: Payer: PPO

## 2018-11-02 ENCOUNTER — Other Ambulatory Visit: Payer: Self-pay

## 2018-11-02 ENCOUNTER — Ambulatory Visit (INDEPENDENT_AMBULATORY_CARE_PROVIDER_SITE_OTHER): Payer: PPO

## 2018-11-02 ENCOUNTER — Other Ambulatory Visit (INDEPENDENT_AMBULATORY_CARE_PROVIDER_SITE_OTHER): Payer: PPO

## 2018-11-02 VITALS — Ht 62.0 in | Wt 100.0 lb

## 2018-11-02 DIAGNOSIS — Z0000 Encounter for general adult medical examination without abnormal findings: Secondary | ICD-10-CM

## 2018-11-02 DIAGNOSIS — M810 Age-related osteoporosis without current pathological fracture: Secondary | ICD-10-CM | POA: Diagnosis not present

## 2018-11-02 DIAGNOSIS — E785 Hyperlipidemia, unspecified: Secondary | ICD-10-CM

## 2018-11-02 LAB — COMPREHENSIVE METABOLIC PANEL
ALT: 13 U/L (ref 0–35)
AST: 17 U/L (ref 0–37)
Albumin: 4.2 g/dL (ref 3.5–5.2)
Alkaline Phosphatase: 68 U/L (ref 39–117)
BUN: 16 mg/dL (ref 6–23)
CO2: 28 mEq/L (ref 19–32)
Calcium: 9.5 mg/dL (ref 8.4–10.5)
Chloride: 105 mEq/L (ref 96–112)
Creatinine, Ser: 0.9 mg/dL (ref 0.40–1.20)
GFR: 61.44 mL/min (ref >60.00)
Glucose, Bld: 102 mg/dL — ABNORMAL HIGH (ref 70–99)
Potassium: 3.6 mEq/L (ref 3.5–5.1)
Sodium: 139 mEq/L (ref 135–145)
Total Bilirubin: 0.3 mg/dL (ref 0.2–1.2)
Total Protein: 7 g/dL (ref 6.0–8.3)

## 2018-11-02 LAB — VITAMIN D 25 HYDROXY (VIT D DEFICIENCY, FRACTURES): VITD: 48.71 ng/mL (ref 30.00–100.00)

## 2018-11-02 LAB — LIPID PANEL
Cholesterol: 207 mg/dL — ABNORMAL HIGH (ref 0–200)
HDL: 71.3 mg/dL (ref >39.00)
LDL Cholesterol: 117 mg/dL — ABNORMAL HIGH (ref 0–99)
NonHDL: 135.94
Total CHOL/HDL Ratio: 3
Triglycerides: 96 mg/dL (ref 0.0–149.0)
VLDL: 19.2 mg/dL (ref 0.0–40.0)

## 2018-11-02 NOTE — Patient Instructions (Signed)
Kristen Caldwell , Thank you for taking time to come for your Medicare Wellness Visit. I appreciate your ongoing commitment to your health goals. Please review the following plan we discussed and let me know if I can assist you in the future.   Screening recommendations/referrals: Colonoscopy: 04/2010 Mammogram: 08/2018 Bone Density: 07/2017 Recommended yearly ophthalmology/optometry visit for glaucoma screening and checkup Recommended yearly dental visit for hygiene and checkup  Vaccinations: Influenza vaccine: 12/2017 Pneumococcal vaccine: 05/2014 Tdap vaccine: 06/2016 Shingles vaccine: discussed    Advanced directives: Advance directive discussed with you today. Even though you declined this today please call our office should you change your mind and we can give you the proper paperwork for you to fill out.   Conditions/risks identified: underweight  Next appointment: 11/09/2018 at 11:30   Preventive Care 72 Years and Older, Female Preventive care refers to lifestyle choices and visits with your health care provider that can promote health and wellness. What does preventive care include?  A yearly physical exam. This is also called an annual well check.  Dental exams once or twice a year.  Routine eye exams. Ask your health care provider how often you should have your eyes checked.  Personal lifestyle choices, including:  Daily care of your teeth and gums.  Regular physical activity.  Eating a healthy diet.  Avoiding tobacco and drug use.  Limiting alcohol use.  Practicing safe sex.  Taking low-dose aspirin every day.  Taking vitamin and mineral supplements as recommended by your health care provider. What happens during an annual well check? The services and screenings done by your health care provider during your annual well check will depend on your age, overall health, lifestyle risk factors, and family history of disease. Counseling  Your health care provider may  ask you questions about your:  Alcohol use.  Tobacco use.  Drug use.  Emotional well-being.  Home and relationship well-being.  Sexual activity.  Eating habits.  History of falls.  Memory and ability to understand (cognition).  Work and work Statistician.  Reproductive health. Screening  You may have the following tests or measurements:  Height, weight, and BMI.  Blood pressure.  Lipid and cholesterol levels. These may be checked every 5 years, or more frequently if you are over 39 years old.  Skin check.  Lung cancer screening. You may have this screening every year starting at age 60 if you have a 30-pack-year history of smoking and currently smoke or have quit within the past 15 years.  Fecal occult blood test (FOBT) of the stool. You may have this test every year starting at age 74.  Flexible sigmoidoscopy or colonoscopy. You may have a sigmoidoscopy every 5 years or a colonoscopy every 10 years starting at age 67.  Hepatitis C blood test.  Hepatitis B blood test.  Sexually transmitted disease (STD) testing.  Diabetes screening. This is done by checking your blood sugar (glucose) after you have not eaten for a while (fasting). You may have this done every 1-3 years.  Bone density scan. This is done to screen for osteoporosis. You may have this done starting at age 60.  Mammogram. This may be done every 1-2 years. Talk to your health care provider about how often you should have regular mammograms. Talk with your health care provider about your test results, treatment options, and if necessary, the need for more tests. Vaccines  Your health care provider may recommend certain vaccines, such as:  Influenza vaccine. This is recommended every  year.  Tetanus, diphtheria, and acellular pertussis (Tdap, Td) vaccine. You may need a Td booster every 10 years.  Zoster vaccine. You may need this after age 68.  Pneumococcal 13-valent conjugate (PCV13) vaccine. One  dose is recommended after age 5.  Pneumococcal polysaccharide (PPSV23) vaccine. One dose is recommended after age 48. Talk to your health care provider about which screenings and vaccines you need and how often you need them. This information is not intended to replace advice given to you by your health care provider. Make sure you discuss any questions you have with your health care provider. Document Released: 03/27/2015 Document Revised: 11/18/2015 Document Reviewed: 12/30/2014 Elsevier Interactive Patient Education  2017 [place] Prevention in the Home Falls can cause injuries. They can happen to people of all ages. There are many things you can do to make your home safe and to help prevent falls. What can I do on the outside of my home?  Regularly fix the edges of walkways and driveways and fix any cracks.  Remove anything that might make you trip as you walk through a door, such as a raised step or threshold.  Trim any bushes or trees on the path to your home.  Use bright outdoor lighting.  Clear any walking paths of anything that might make someone trip, such as rocks or tools.  Regularly check to see if handrails are loose or broken. Make sure that both sides of any steps have handrails.  Any raised decks and porches should have guardrails on the edges.  Have any leaves, snow, or ice cleared regularly.  Use sand or salt on walking paths during winter.  Clean up any spills in your garage right away. This includes oil or grease spills. What can I do in the bathroom?  Use night lights.  Install grab bars by the toilet and in the tub and shower. Do not use towel bars as grab bars.  Use non-skid mats or decals in the tub or shower.  If you need to sit down in the shower, use a plastic, non-slip stool.  Keep the floor dry. Clean up any water that spills on the floor as soon as it happens.  Remove soap buildup in the tub or shower regularly.  Attach bath  mats securely with double-sided non-slip rug tape.  Do not have throw rugs and other things on the floor that can make you trip. What can I do in the bedroom?  Use night lights.  Make sure that you have a light by your bed that is easy to reach.  Do not use any sheets or blankets that are too big for your bed. They should not hang down onto the floor.  Have a firm chair that has side arms. You can use this for support while you get dressed.  Do not have throw rugs and other things on the floor that can make you trip. What can I do in the kitchen?  Clean up any spills right away.  Avoid walking on wet floors.  Keep items that you use a lot in easy-to-reach places.  If you need to reach something above you, use a strong step stool that has a grab bar.  Keep electrical cords out of the way.  Do not use floor polish or wax that makes floors slippery. If you must use wax, use non-skid floor wax.  Do not have throw rugs and other things on the floor that can make you trip. What can  I do with my stairs?  Do not leave any items on the stairs.  Make sure that there are handrails on both sides of the stairs and use them. Fix handrails that are broken or loose. Make sure that handrails are as long as the stairways.  Check any carpeting to make sure that it is firmly attached to the stairs. Fix any carpet that is loose or worn.  Avoid having throw rugs at the top or bottom of the stairs. If you do have throw rugs, attach them to the floor with carpet tape.  Make sure that you have a light switch at the top of the stairs and the bottom of the stairs. If you do not have them, ask someone to add them for you. What else can I do to help prevent falls?  Wear shoes that:  Do not have high heels.  Have rubber bottoms.  Are comfortable and fit you well.  Are closed at the toe. Do not wear sandals.  If you use a stepladder:  Make sure that it is fully opened. Do not climb a closed  stepladder.  Make sure that both sides of the stepladder are locked into place.  Ask someone to hold it for you, if possible.  Clearly mark and make sure that you can see:  Any grab bars or handrails.  First and last steps.  Where the edge of each step is.  Use tools that help you move around (mobility aids) if they are needed. These include:  Canes.  Walkers.  Scooters.  Crutches.  Turn on the lights when you go into a dark area. Replace any light bulbs as soon as they burn out.  Set up your furniture so you have a clear path. Avoid moving your furniture around.  If any of your floors are uneven, fix them.  If there are any pets around you, be aware of where they are.  Review your medicines with your doctor. Some medicines can make you feel dizzy. This can increase your chance of falling. Ask your doctor what other things that you can do to help prevent falls. This information is not intended to replace advice given to you by your health care provider. Make sure you discuss any questions you have with your health care provider. Document Released: 12/25/2008 Document Revised: 08/06/2015 Document Reviewed: 04/04/2014 Elsevier Interactive Patient Education  2017 [employer].

## 2018-11-02 NOTE — Progress Notes (Signed)
PCP notes:  Health Maintenance:  Hep C screening due.  Abnormal Screenings:  None  Patient concerns:  None  Nurse concerns:  None  Next PCP appt.: 11/09/2018 at 11:30

## 2018-11-02 NOTE — Progress Notes (Signed)
Subjective:   Kristen Caldwell is a 73 y.o. female who presents for Medicare Annual (Subsequent) preventive examination.  This visit type was conducted due to national recommendations for restrictions regarding the COVID-19 Pandemic (e.g. social distancing). This format is felt to be most appropriate for this patient at this time. All issues noted in this document were discussed and addressed. No physical exam was performed (except for noted visual exam findings with Video Visits). This patient, Kristen Caldwell, has given permission to perform this visit via telephone. Vital signs may be absent or patient reported.  Patient location:  At home  Nurse location:  At home    Review of Systems:  n/a Cardiac Risk Factors include: advanced age (>57men, >72 women);dyslipidemia     Objective:     Vitals: Ht 5\' 2"  (1.575 m) Comment: per patient  Wt 100 lb (45.4 kg) Comment: per patient  BMI 18.29 kg/m   Body mass index is 18.29 kg/m.  Advanced Directives 11/02/2018  Does Patient Have a Medical Advance Directive? No    Tobacco Social History   Tobacco Use  Smoking Status Never Smoker  Smokeless Tobacco Never Used     Counseling given: Not Answered   Clinical Intake:  Pre-visit preparation completed: Yes  Pain : No/denies pain     Nutritional Status: BMI <19  Underweight Nutritional Risks: None Diabetes: No  How often do you need to have someone help you when you read instructions, pamphlets, or other written materials from your doctor or pharmacy?: 1 - Never What is the last grade level you completed in school?: 12th grade  Interpreter Needed?: No  Information entered by :: NAllen LPN  Past Medical History:  Diagnosis Date  . Anxiety   . Chronic headaches   . Depression   . Diverticulosis   . GERD (gastroesophageal reflux disease)   . Osteoporosis    Past Surgical History:  Procedure Laterality Date  . ABDOMINAL HYSTERECTOMY  03/14/1989   partial  . COLONOSCOPY    . TUBAL LIGATION     Family History  Problem Relation Age of Onset  . Aneurysm Mother   . Stroke Father   . Hypertension Father   . Hypertension Brother   . Heart attack Brother   . Lupus Sister   . Diabetes Paternal Grandfather   . Breast cancer Paternal Aunt   . Colon cancer Neg Hx    Social History   Socioeconomic History  . Marital status: Married    Spouse name: Not on file  . Number of children: 2  . Years of education: Not on file  . Highest education level: Not on file  Occupational History  . Not on file  Social Needs  . Financial resource strain: Not hard at all  . Food insecurity    Worry: Never true    Inability: Never true  . Transportation needs    Medical: No    Non-medical: No  Tobacco Use  . Smoking status: Never Smoker  . Smokeless tobacco: Never Used  Substance and Sexual Activity  . Alcohol use: No  . Drug use: No  . Sexual activity: Not Currently  Lifestyle  . Physical activity    Days per week: 0 days    Minutes per session: 0 min  . Stress: Rather much  Relationships  . Social Herbalist on phone: Not on file    Gets together: Not on file    Attends religious service: Not  on file    Active member of club or organization: Not on file    Attends meetings of clubs or organizations: Not on file    Relationship status: Not on file  Other Topics Concern  . Not on file  Social History Narrative   Desires CPR.   Would not want feeding tubes or prolonged life support.             Outpatient Encounter Medications as of 11/02/2018  Medication Sig  . amitriptyline (ELAVIL) 25 MG tablet Take 1 tablet (25 mg total) by mouth at bedtime as needed for sleep.  . Calcium Carbonate-Vit D-Min (CALTRATE 600+D PLUS) 600-400 MG-UNIT per tablet Take 2 tablets by mouth daily.   . clorazepate (TRANXENE) 3.75 MG tablet Take 1 tablet (3.75 mg total) by mouth daily.  Marland Kitchen estradiol (ESTRACE) 0.5 MG tablet Take 1 tablet (0.5  mg total) by mouth daily.  . Multiple Vitamins-Minerals (CENTRUM SILVER) CHEW Chew 1 tablet by mouth daily.    . Omega-3 Fatty Acids (FISH OIL PO) Take 1 capsule by mouth 2 (two) times daily.    Marland Kitchen omeprazole (PRILOSEC) 20 MG capsule TAKE 1 CAPSULE BY MOUTH EVERY DAY  . topiramate (TOPAMAX) 25 MG tablet Take 1 tablet (25 mg total) by mouth 2 (two) times daily.   No facility-administered encounter medications on file as of 11/02/2018.     Activities of Daily Living In your present state of health, do you have any difficulty performing the following activities: 11/02/2018  Hearing? N  Vision? N  Difficulty concentrating or making decisions? N  Walking or climbing stairs? N  Dressing or bathing? N  Doing errands, shopping? N  Preparing Food and eating ? N  Using the Toilet? N  In the past six months, have you accidently leaked urine? N  Do you have problems with loss of bowel control? N  Managing your Medications? N  Managing your Finances? N  Housekeeping or managing your Housekeeping? N  Some recent data might be hidden    Patient Care Team: Elby Beck, FNP as PCP - General (Nurse Practitioner) Gatha Mayer, MD as Consulting Physician (Gastroenterology)    Assessment:   This is a routine wellness examination for Kristen Caldwell.  Exercise Activities and Dietary recommendations Current Exercise Habits: The patient has a physically strenuous job, but has no regular exercise apart from work.;The patient does not participate in regular exercise at present  Goals    . Patient Stated     11/02/2018, wants to stay healthy       Fall Risk Fall Risk  11/02/2018 07/14/2017 06/29/2016 05/29/2014 05/23/2013  Falls in the past year? 0 No No No No  Risk for fall due to : Medication side effect - - - -  Follow up Falls evaluation completed;Falls prevention discussed - - - -   Is the patient's home free of loose throw rugs in walkways, pet beds, electrical cords, etc?   yes      Grab bars  in the bathroom? yes      Handrails on the stairs?   n/a      Adequate lighting?   yes  Timed Get Up and Go performed: n/a  Depression Screen PHQ 2/9 Scores 11/02/2018 07/14/2017 06/29/2016 05/29/2014  PHQ - 2 Score 0 0 1 0  PHQ- 9 Score 1 - - -     Cognitive Function MMSE - Mini Mental State Exam 11/02/2018  Orientation to time 4  Orientation to Place 5  Registration 3  Attention/ Calculation 5  Recall 3  Language- name 2 objects 0  Language- repeat 1  Language- follow 3 step command 0  Language- read & follow direction 0  Write a sentence 0  Copy design 0  Total score 21   Mini Cog  Mini-Cog screen was completed. Maximum score is 22. A value of 0 denotes this part of the MMSE was not completed or the patient failed this part of the Mini-Cog screening.       Immunization History  Administered Date(s) Administered  . Influenza Split 01/31/2011, 12/15/2011  . Influenza Whole 12/10/2007, 03/10/2009, 12/08/2009  . Influenza,inj,Quad PF,6+ Mos 12/26/2012, 11/07/2013, 02/13/2015, 11/13/2015, 12/14/2017  . Influenza-Unspecified 12/19/2016  . Pneumococcal Conjugate-13 05/29/2014  . Pneumococcal Polysaccharide-23 04/25/2011  . Td 02/08/2006  . Tdap 07/01/2016  . Zoster 07/30/2009    Qualifies for Shingles Vaccine? yes  Screening Tests Health Maintenance  Topic Date Due  . Hepatitis C Screening  06/09/1945  . INFLUENZA VACCINE  10/13/2018  . MAMMOGRAM  08/17/2019  . COLONOSCOPY  05/11/2020  . TETANUS/TDAP  07/02/2026  . DEXA SCAN  Completed  . PNA vac Low Risk Adult  Completed    Cancer Screenings: Lung: Low Dose CT Chest recommended if Age 7-80 years, 30 pack-year currently smoking OR have quit w/in 15years. Patient does not qualify. Breast:  Up to date on Mammogram? Yes   Up to date of Bone Density/Dexa? Yes Colorectal: up to date   Additional Screenings: : Hepatitis C Screening: due     Plan:    Patient wants to remain healthy so she can care for her  husband.   I have personally reviewed and noted the following in the patient's chart:   . Medical and social history . Use of alcohol, tobacco or illicit drugs  . Current medications and supplements . Functional ability and status . Nutritional status . Physical activity . Advanced directives . List of other physicians . Hospitalizations, surgeries, and ER visits in previous 12 months . Vitals . Screenings to include cognitive, depression, and falls . Referrals and appointments  In addition, I have reviewed and discussed with patient certain preventive protocols, quality metrics, and best practice recommendations. A written personalized care plan for preventive services as well as general preventive health recommendations were provided to patient.     Kellie Simmering, LPN  [id number]

## 2018-11-08 NOTE — Progress Notes (Signed)
I reviewed health advisor's note, was available for consultation, and agree with documentation and plan.  

## 2018-11-09 ENCOUNTER — Other Ambulatory Visit: Payer: Self-pay | Admitting: Family Medicine

## 2018-11-09 ENCOUNTER — Ambulatory Visit (INDEPENDENT_AMBULATORY_CARE_PROVIDER_SITE_OTHER): Payer: PPO | Admitting: Family Medicine

## 2018-11-09 ENCOUNTER — Telehealth: Payer: Self-pay

## 2018-11-09 ENCOUNTER — Other Ambulatory Visit: Payer: Self-pay

## 2018-11-09 ENCOUNTER — Encounter: Payer: Self-pay | Admitting: Family Medicine

## 2018-11-09 VITALS — BP 122/80 | HR 74 | Temp 97.9°F | Ht 62.0 in | Wt 97.8 lb

## 2018-11-09 DIAGNOSIS — R519 Headache, unspecified: Secondary | ICD-10-CM

## 2018-11-09 DIAGNOSIS — Z0000 Encounter for general adult medical examination without abnormal findings: Secondary | ICD-10-CM

## 2018-11-09 DIAGNOSIS — N6320 Unspecified lump in the left breast, unspecified quadrant: Secondary | ICD-10-CM | POA: Diagnosis not present

## 2018-11-09 DIAGNOSIS — Z79890 Hormone replacement therapy (postmenopausal): Secondary | ICD-10-CM | POA: Diagnosis not present

## 2018-11-09 DIAGNOSIS — F411 Generalized anxiety disorder: Secondary | ICD-10-CM | POA: Diagnosis not present

## 2018-11-09 DIAGNOSIS — R51 Headache: Secondary | ICD-10-CM

## 2018-11-09 DIAGNOSIS — Z23 Encounter for immunization: Secondary | ICD-10-CM | POA: Diagnosis not present

## 2018-11-09 MED ORDER — AMITRIPTYLINE HCL 25 MG PO TABS: 25.0000 mg | ORAL_TABLET | Freq: Every evening | ORAL | 3 refills | Status: DC | PRN

## 2018-11-09 MED ORDER — TOPIRAMATE 25 MG PO TABS: 25.0000 mg | ORAL_TABLET | Freq: Two times a day (BID) | ORAL | 3 refills | Status: DC

## 2018-11-09 MED ORDER — CLORAZEPATE DIPOTASSIUM 3.75 MG PO TABS: 3.7500 mg | ORAL_TABLET | Freq: Every day | ORAL | 1 refills | Status: DC

## 2018-11-09 MED ORDER — ESTRADIOL 0.5 MG PO TABS: 0.5000 mg | ORAL_TABLET | Freq: Every day | ORAL | 3 refills | Status: DC

## 2018-11-09 NOTE — Patient Instructions (Addendum)
Good to see you today  Please follow up in 6 months  Increase protein, look at Protein Drinks [employer] has 30 grams of protein and good flavors)   Health Maintenance After Age 72 After age 10, you are at a higher risk for certain long-term diseases and infections as well as injuries from falls. Falls are a major cause of broken bones and head injuries in people who are older than age 50. Getting regular preventive care can help to keep you healthy and well. Preventive care includes getting regular testing and making lifestyle changes as recommended by your health care provider. Talk with your health care provider about:  Which screenings and tests you should have. A screening is a test that checks for a disease when you have no symptoms.  A diet and exercise plan that is right for you. What should I know about screenings and tests to prevent falls? Screening and testing are the best ways to find a health problem early. Early diagnosis and treatment give you the best chance of managing medical conditions that are common after age 75. Certain conditions and lifestyle choices may make you more likely to have a fall. Your health care provider may recommend:  Regular vision checks. Poor vision and conditions such as cataracts can make you more likely to have a fall. If you wear glasses, make sure to get your prescription updated if your vision changes.  Medicine review. Work with your health care provider to regularly review all of the medicines you are taking, including over-the-counter medicines. Ask your health care provider about any side effects that may make you more likely to have a fall. Tell your health care provider if any medicines that you take make you feel dizzy or sleepy.  Osteoporosis screening. Osteoporosis is a condition that causes the bones to get weaker. This can make the bones weak and cause them to break more easily.  Blood pressure screening. Blood pressure changes and  medicines to control blood pressure can make you feel dizzy.  Strength and balance checks. Your health care provider may recommend certain tests to check your strength and balance while standing, walking, or changing positions.  Foot health exam. Foot pain and numbness, as well as not wearing proper footwear, can make you more likely to have a fall.  Depression screening. You may be more likely to have a fall if you have a fear of falling, feel emotionally low, or feel unable to do activities that you used to do.  Alcohol use screening. Using too much alcohol can affect your balance and may make you more likely to have a fall. What actions can I take to lower my risk of falls? General instructions  Talk with your health care provider about your risks for falling. Tell your health care provider if: ? You fall. Be sure to tell your health care provider about all falls, even ones that seem minor. ? You feel dizzy, sleepy, or off-balance.  Take over-the-counter and prescription medicines only as told by your health care provider. These include any supplements.  Eat a healthy diet and maintain a healthy weight. A healthy diet includes low-fat dairy products, low-fat (lean) meats, and fiber from whole grains, beans, and lots of fruits and vegetables. Home safety  Remove any tripping hazards, such as rugs, cords, and clutter.  Install safety equipment such as grab bars in bathrooms and safety rails on stairs.  Keep rooms and walkways well-lit. Activity   Follow a regular exercise program  to stay fit. This will help you maintain your balance. Ask your health care provider what types of exercise are appropriate for you.  If you need a cane or walker, use it as recommended by your health care provider.  Wear supportive shoes that have nonskid soles. Lifestyle  Do not drink alcohol if your health care provider tells you not to drink.  If you drink alcohol, limit how much you have: ? 0-1  drink a day for women. ? 0-2 drinks a day for men.  Be aware of how much alcohol is in your drink. In the [place] one drink equals one typical bottle of beer (12 oz), one-half glass of wine (5 oz), or one shot of hard liquor (1 oz).  Do not use any products that contain nicotine or tobacco, such as cigarettes and e-cigarettes. If you need help quitting, ask your health care provider. Summary  Having a healthy lifestyle and getting preventive care can help to protect your health and wellness after age 25.  Screening and testing are the best way to find a health problem early and help you avoid having a fall. Early diagnosis and treatment give you the best chance for managing medical conditions that are more common for people who are older than age 72.  Falls are a major cause of broken bones and head injuries in people who are older than age 41. Take precautions to prevent a fall at home.  Work with your health care provider to learn what changes you can make to improve your health and wellness and to prevent falls. This information is not intended to replace advice given to you by your health care provider. Make sure you discuss any questions you have with your health care provider. Document Released: 01/11/2017 Document Revised: 06/21/2018 Document Reviewed: 01/11/2017 Elsevier Patient Education  2020 [employer].

## 2018-11-09 NOTE — Telephone Encounter (Signed)
Pt said was seen earlier at [facility] and clorazepate & amitriptyline was supposed to go to [facility] [address]. And other meds refilled were to go to [facility]. Pt wants clorazepate and amitriptylinle sent to [facility] [address] today.

## 2018-11-09 NOTE — Telephone Encounter (Signed)
Prescriptions sent to her preferred pharmacy.
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
## 2018-11-26 ENCOUNTER — Encounter: Payer: Self-pay | Admitting: Family Medicine

## 2018-11-26 DIAGNOSIS — D1779 Benign lipomatous neoplasm of other sites: Secondary | ICD-10-CM | POA: Diagnosis not present

## 2018-11-26 DIAGNOSIS — N6324 Unspecified lump in the left breast, lower inner quadrant: Secondary | ICD-10-CM | POA: Diagnosis not present

## 2018-12-17 ENCOUNTER — Encounter: Payer: Self-pay | Admitting: Family Medicine

## 2018-12-27 DIAGNOSIS — H2513 Age-related nuclear cataract, bilateral: Secondary | ICD-10-CM | POA: Diagnosis not present

## 2019-03-04 ENCOUNTER — Encounter: Payer: Self-pay | Admitting: Family Medicine

## 2019-03-04 ENCOUNTER — Ambulatory Visit (INDEPENDENT_AMBULATORY_CARE_PROVIDER_SITE_OTHER): Payer: PPO | Admitting: Family Medicine

## 2019-03-04 ENCOUNTER — Other Ambulatory Visit: Payer: Self-pay

## 2019-03-04 VITALS — BP 108/78 | HR 98 | Temp 97.4°F | Ht 62.0 in | Wt 98.0 lb

## 2019-03-04 DIAGNOSIS — R319 Hematuria, unspecified: Secondary | ICD-10-CM

## 2019-03-04 DIAGNOSIS — R8290 Unspecified abnormal findings in urine: Secondary | ICD-10-CM | POA: Diagnosis not present

## 2019-03-04 DIAGNOSIS — F329 Major depressive disorder, single episode, unspecified: Secondary | ICD-10-CM | POA: Diagnosis not present

## 2019-03-04 DIAGNOSIS — N3090 Cystitis, unspecified without hematuria: Secondary | ICD-10-CM | POA: Diagnosis not present

## 2019-03-04 LAB — POCT URINALYSIS DIPSTICK
Bilirubin, UA: NEGATIVE
Glucose, UA: NEGATIVE
Ketones, UA: NEGATIVE
Nitrite, UA: NEGATIVE
Protein, UA: POSITIVE — AB
Spec Grav, UA: <=1.005 — AB (ref 1.010–1.025)
Urobilinogen, UA: 0.2 E.U./dL
pH, UA: 7.5 (ref 5.0–8.0)

## 2019-03-04 MED ORDER — SULFAMETHOXAZOLE-TRIMETHOPRIM 800-160 MG PO TABS: 1.0000 | ORAL_TABLET | Freq: Two times a day (BID) | ORAL | 0 refills | Status: DC

## 2019-03-04 MED ORDER — SERTRALINE HCL 50 MG PO TABS: 25.0000 mg | ORAL_TABLET | Freq: Every day | ORAL | 3 refills | Status: DC
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
## 2019-03-04 NOTE — Patient Instructions (Signed)
Good to see you today  I have sent in an antibiotic to your pharmacy. It is twice a day for 5 days.   After you finish your antibiotic, start the sertraline- 1/2 tablet in the evening.    Urinary Tract Infection, Adult A urinary tract infection (UTI) is an infection of any part of the urinary tract. The urinary tract includes:  The kidneys.  The ureters.  The bladder.  The urethra. These organs make, store, and get rid of pee (urine) in the body. What are the causes? This is caused by germs (bacteria) in your genital area. These germs grow and cause swelling (inflammation) of your urinary tract. What increases the risk? You are more likely to develop this condition if:  You have a small, thin tube (catheter) to drain pee.  You cannot control when you pee or poop (incontinence).  You are female, and: ? You use these methods to prevent pregnancy: ? A medicine that kills sperm (spermicide). ? A device that blocks sperm (diaphragm). ? You have low levels of a female hormone (estrogen). ? You are pregnant.  You have genes that add to your risk.  You are sexually active.  You take antibiotic medicines.  You have trouble peeing because of: ? A prostate that is bigger than normal, if you are female. ? A blockage in the part of your body that drains pee from the bladder (urethra). ? A kidney stone. ? A nerve condition that affects your bladder (neurogenic bladder). ? Not getting enough to drink. ? Not peeing often enough.  You have other conditions, such as: ? Diabetes. ? A weak disease-fighting system (immune system). ? Sickle cell disease. ? Gout. ? Injury of the spine. What are the signs or symptoms? Symptoms of this condition include:  Needing to pee right away (urgently).  Peeing often.  Peeing small amounts often.  Pain or burning when peeing.  Blood in the pee.  Pee that smells bad or not like normal.  Trouble peeing.  Pee that is cloudy.  Fluid  coming from the vagina, if you are female.  Pain in the belly or lower back. Other symptoms include:  Throwing up (vomiting).  No urge to eat.  Feeling mixed up (confused).  Being tired and grouchy (irritable).  A fever.  Watery poop (diarrhea). How is this treated? This condition may be treated with:  Antibiotic medicine.  Other medicines.  Drinking enough water. Follow these instructions at home:  Medicines  Take over-the-counter and prescription medicines only as told by your doctor.  If you were prescribed an antibiotic medicine, take it as told by your doctor. Do not stop taking it even if you start to feel better. General instructions  Make sure you: ? Pee until your bladder is empty. ? Do not hold pee for a long time. ? Empty your bladder after sex. ? Wipe from front to back after pooping if you are a female. Use each tissue one time when you wipe.  Drink enough fluid to keep your pee pale yellow.  Keep all follow-up visits as told by your doctor. This is important. Contact a doctor if:  You do not get better after 1-2 days.  Your symptoms go away and then come back. Get help right away if:  You have very bad back pain.  You have very bad pain in your lower belly.  You have a fever.  You are sick to your stomach (nauseous).  You are throwing up. Summary  A urinary tract infection (UTI) is an infection of any part of the urinary tract.  This condition is caused by germs in your genital area.  There are many risk factors for a UTI. These include having a small, thin tube to drain pee and not being able to control when you pee or poop.  Treatment includes antibiotic medicines for germs.  Drink enough fluid to keep your pee pale yellow. This information is not intended to replace advice given to you by your health care provider. Make sure you discuss any questions you have with your health care provider. Document Released: 08/17/2007 Document  Revised: 02/15/2018 Document Reviewed: 09/07/2017 Elsevier Patient Education  2020 [employer].

## 2019-03-04 NOTE — Addendum Note (Signed)
Addended by: Ellamae Sia on: 03/04/2019 03:05 PM   Modules accepted: Orders

## 2019-03-06 ENCOUNTER — Other Ambulatory Visit: Payer: Self-pay | Admitting: Family Medicine

## 2019-03-06 ENCOUNTER — Telehealth: Payer: Self-pay | Admitting: Family Medicine

## 2019-03-06 DIAGNOSIS — B3731 Acute candidiasis of vulva and vagina: Secondary | ICD-10-CM

## 2019-03-06 DIAGNOSIS — B373 Candidiasis of vulva and vagina: Secondary | ICD-10-CM

## 2019-03-06 LAB — URINE CULTURE
MICRO NUMBER:: 1217799
SPECIMEN QUALITY:: ADEQUATE

## 2019-03-06 MED ORDER — CEFUROXIME AXETIL 250 MG PO TABS: 250.0000 mg | ORAL_TABLET | Freq: Two times a day (BID) | ORAL | 0 refills | Status: DC

## 2019-03-06 MED ORDER — FLUCONAZOLE 150 MG PO TABS: 150.0000 mg | ORAL_TABLET | Freq: Once | ORAL | 0 refills | Status: AC

## 2019-03-06 NOTE — Addendum Note (Signed)
Addended by: Clarene Reamer B on: 03/06/2019 09:26 AM   Modules accepted: Orders

## 2019-03-06 NOTE — Telephone Encounter (Addendum)
Kristen Caldwell notified as instructed by telephone.  Patient states understanding.

## 2019-03-06 NOTE — Telephone Encounter (Signed)
Please call patient and let her know that I have sent in an oral medication for yeast. She will take one tablet today and repeat if needed in 3 days.

## 2019-03-06 NOTE — Telephone Encounter (Signed)
Patient stated she was seen on Monday for a UTI.  She was prescribed an Antibiotic for this.  Patient said she is starting to get a yeast infection now from taking the medication and would like to know if there is something that could be prescribed for this.   Does the patient need to come in for a visit for this before the medication can be prescribed ?      [id number] - [place], [place]

## 2019-03-18 NOTE — Telephone Encounter (Signed)
I spoke with pt; pt did not go to UC to be seen; pt said the vaginal discharge has stopped. No other symptoms at this time. Pt is drinking a lot of water. Pt will cb if symptoms return. UC & ED precautions given and pt voiced understanding.

## 2019-03-18 NOTE — Telephone Encounter (Signed)
[facility] Night - Client TELEPHONE ADVICE RECORD [facility] Patient Name: Kristen Caldwell Gender: Female DOB: Feb 24, 1946 Age: 74 Y 11 M 23 D Return Phone Number: [phone] (Primary) [place] [address] Client [place] Night - Client Client Site [place] Physician Tor Netters- NP Contact Type Call Who Is Calling Patient / Member / Family / Caregiver Call Type Triage / Clinical Relationship To Patient Self Return Phone Number [phone] (Primary) Chief Complaint Vaginal Discharge Reason for Call Symptomatic / Request for Health Information Initial Comment **CC no new symptoms @ 1:04pm** Caller states she just completed her antibiotic for UTI but she still has symptoms and wants to see about getting some more medication. Her symptoms are vaginal discharge and it hurts on the side of her back. She states it also hurts when she wipes. Cefuroxime 200mg  is the medication she has been taking, 1 PO BID. Translation No Nurse Assessment Nurse: Leilani Merl, RN, Heather Date/Time (Eastern Time): 03/16/2019 1:09:29 PM Confirm and document reason for call. If symptomatic, describe symptoms. ---Caller states she just completed her antibiotic for UTI but she still has symptoms and wants to see about getting some more medication. Her symptoms are vaginal discharge and it hurts on the side of her back. She states it also hurts when she wipes. Cefuroxime 200mg  is the medication she has been taking, 1 PO BID. Yesterday she started with red tinted vaginal discharge. She is not having any blood in her urine Has the patient had close contact with a person known or suspected to have the novel coronavirus illness OR traveled / lives in area with major community spread (including international travel) in the last 14 days from the onset of symptoms? * If Asymptomatic, screen for exposure  and travel within the last 14 days. ---No Does the patient have any new or worsening symptoms? ---Yes Will a triage be completed? ---Yes Related visit to physician within the last 2 weeks? ---No Does the PT have any chronic conditions? (i.e. diabetes, asthma, this includes High risk factors for pregnancy, etc.) ---Unknown Is this a behavioral health or substance abuse call? ---No PLEASE NOTE: All timestamps contained within this report are represented as [place] Standard Time. CONFIDENTIALTY NOTICE: This fax transmission is intended only for the addressee. It contains information that is legally privileged, confidential or otherwise protected from use or disclosure. If you are not the intended recipient, you are strictly prohibited from reviewing, disclosing, copying using or disseminating any of this information or taking any action in reliance on or regarding this information. If you have received this fax in error, please notify us immediately by telephone so that we can arrange for its return to [place]. Phone: [phone], Toll-Free: [phone], Fax: [phone] Page: 2 of 2 Call Id: [id number] Guidelines Guideline Title Affirmed Question Affirmed Notes Nurse Date/Time [name] Time) Urination Pain - Female Side (flank) or lower back pain present Standifer, RN, Heather 03/16/2019 1:12:16 PM Disp. Time [name] Time) Disposition Final User 03/16/2019 1:09:33 PM Send To Nurse Milagros Reap, RN, Haynes Dage 03/16/2019 1:16:37 PM See HCP within 4 Hours (or PCP triage) Yes Standifer, RN, Soyla Murphy Disagree/Comply Disagree Caller Understands Yes PreDisposition Call Doctor Care Advice Given Per Guideline SEE HCP WITHIN 4 HOURS (OR PCP TRIAGE): CALL BACK IF: * You become worse. CARE ADVICE given per Urination Pain - Female (Adult) guideline. Referrals GO TO FACILITY REFUSED

## 2019-03-18 NOTE — Telephone Encounter (Signed)
Noted  

## 2019-03-20 ENCOUNTER — Other Ambulatory Visit (INDEPENDENT_AMBULATORY_CARE_PROVIDER_SITE_OTHER): Payer: PPO

## 2019-03-20 ENCOUNTER — Telehealth: Payer: Self-pay | Admitting: Family Medicine

## 2019-03-20 ENCOUNTER — Other Ambulatory Visit (INDEPENDENT_AMBULATORY_CARE_PROVIDER_SITE_OTHER): Payer: PPO | Admitting: *Deleted

## 2019-03-20 DIAGNOSIS — M545 Low back pain, unspecified: Secondary | ICD-10-CM

## 2019-03-20 LAB — POCT URINALYSIS DIPSTICK
Bilirubin, UA: NEGATIVE
Blood, UA: NEGATIVE
Glucose, UA: NEGATIVE
Ketones, UA: NEGATIVE
Leukocytes, UA: NEGATIVE
Nitrite, UA: NEGATIVE
Protein, UA: NEGATIVE
Spec Grav, UA: 1.01 (ref 1.010–1.025)
Urobilinogen, UA: 0.2 E.U./dL
pH, UA: 6 (ref 5.0–8.0)

## 2019-03-20 NOTE — Telephone Encounter (Signed)
Please call patient let her know that her urine looked good but that I am sending it for a culture to double check and make sure the infection has cleared.  Tell her to continue to drink enough fluids to keep her urine light yellow.  It will take a couple of days to get the urine culture back.

## 2019-03-20 NOTE — Addendum Note (Signed)
Addended by: Virl Cagey on: 03/20/2019 12:19 PM   Modules accepted: Orders

## 2019-03-20 NOTE — Telephone Encounter (Signed)
Pt aware of recommendations per Debbie.   Nothing further needed.  

## 2019-03-20 NOTE — Telephone Encounter (Signed)
Pt came by and left a sample.  This has been processed.  See results in Epic.

## 2019-03-20 NOTE — Addendum Note (Signed)
Addended by: Virl Cagey on: 03/20/2019 04:35 PM   Modules accepted: Orders

## 2019-03-20 NOTE — Progress Notes (Signed)
Urin cultu

## 2019-03-20 NOTE — Telephone Encounter (Signed)
Patient called.  Patient's having low back pain and is concerned she may still have a UTI.  Debbie said she'd order a urine test.  Patient said she's at work and doesn't have anything to put it in.  I spoke with the lab and they said she can come in and leave the sample.  Patient said she'll come by at lunch time.

## 2019-03-21 ENCOUNTER — Ambulatory Visit: Payer: PPO | Attending: Internal Medicine

## 2019-03-21 DIAGNOSIS — Z20822 Contact with and (suspected) exposure to covid-19: Secondary | ICD-10-CM

## 2019-03-21 LAB — URINE CULTURE
MICRO NUMBER:: 10013315
SPECIMEN QUALITY:: ADEQUATE

## 2019-03-23 LAB — NOVEL CORONAVIRUS, NAA: SARS-CoV-2, NAA: NOT DETECTED

## 2019-03-31 DIAGNOSIS — Z20828 Contact with and (suspected) exposure to other viral communicable diseases: Secondary | ICD-10-CM | POA: Diagnosis not present

## 2019-04-27 ENCOUNTER — Ambulatory Visit: Payer: PPO | Attending: Internal Medicine

## 2019-04-27 DIAGNOSIS — Z23 Encounter for immunization: Secondary | ICD-10-CM | POA: Insufficient documentation

## 2019-04-27 NOTE — Progress Notes (Signed)
   Covid-19 Vaccination Clinic  Name:  Kristen Caldwell    MRN: [id number] DOB: 1945-08-03  04/27/2019  [name] was observed post Covid-19 immunization for 15 minutes without incidence. She was provided with Vaccine Information Sheet and instruction to access the V-Safe system.   [name] was instructed to call 911 with any severe reactions post vaccine: Marland Kitchen Difficulty breathing  . Swelling of your face and throat  . A fast heartbeat  . A bad rash all over your body  . Dizziness and weakness    Immunizations Administered    Name Date Dose VIS Date Route   Pfizer COVID-19 Vaccine 04/27/2019 11:06 AM 0.3 mL 02/22/2019 Intramuscular   Manufacturer: [place]   Lot: [id number]   [place]: [id number]

## 2019-04-30 ENCOUNTER — Telehealth: Payer: Self-pay | Admitting: *Deleted

## 2019-04-30 NOTE — Telephone Encounter (Signed)
Please see if patient's symptoms have resolved. If not, offer her a virtual visit.

## 2019-04-30 NOTE — Telephone Encounter (Signed)
Pt states that she wants to watch her symptoms tonight and will call tomorrow to schedule if she is still not feeling well.  Pt was very indecisive about scheduling her appt today vs tomorrow.   Will await call back with symptom update.

## 2019-04-30 NOTE — Telephone Encounter (Signed)
Thank you. Will await update from patient.

## 2019-04-30 NOTE — Telephone Encounter (Signed)
Patient called stating that she took her first covid vaccine Saturday. Patient stated that her arm was sore for several days, but better today. Patient stated that she started with nausea Sunday and continues being nauseated. Patient stated the nausea seems to be better when she keeps food on her stomach. Patient denies any other symptoms. Advised patent if keeping food on her stomach helps the nausea she should continue that. Patient stated that she has been drinking lots of water hoping that would help also. Pharmacy [facility], [address].

## 2019-05-03 ENCOUNTER — Telehealth: Payer: Self-pay | Admitting: Family Medicine

## 2019-05-03 ENCOUNTER — Other Ambulatory Visit: Payer: Self-pay

## 2019-05-03 DIAGNOSIS — K219 Gastro-esophageal reflux disease without esophagitis: Secondary | ICD-10-CM

## 2019-05-03 MED ORDER — OMEPRAZOLE 20 MG PO CPDR: 20.0000 mg | DELAYED_RELEASE_CAPSULE | Freq: Every day | ORAL | 2 refills | Status: DC

## 2019-05-03 NOTE — Telephone Encounter (Signed)
Pt states that she is finally better - the symptoms went away. She is feeling much better.   Will send to [facility] as Juluis Rainier

## 2019-05-03 NOTE — Telephone Encounter (Signed)
Noted  

## 2019-05-03 NOTE — Telephone Encounter (Signed)
Pt states that she cannot take Sertraline d/t is giving her diarrhea. Pt states that she took the first dose (1/2 tablet) and it caused her to have diarrhea and she has not taken it since - So has not taken since December.   Added to Intolerance list, patient states that she is NOT taking this again  Will send to [place] as FYI.

## 2019-05-13 ENCOUNTER — Other Ambulatory Visit: Payer: Self-pay

## 2019-05-13 ENCOUNTER — Encounter: Payer: Self-pay | Admitting: Family Medicine

## 2019-05-13 ENCOUNTER — Ambulatory Visit (INDEPENDENT_AMBULATORY_CARE_PROVIDER_SITE_OTHER): Payer: PPO | Admitting: Family Medicine

## 2019-05-13 VITALS — BP 142/90 | HR 78 | Temp 97.9°F | Ht 62.0 in | Wt 96.0 lb

## 2019-05-13 DIAGNOSIS — Z636 Dependent relative needing care at home: Secondary | ICD-10-CM | POA: Diagnosis not present

## 2019-05-13 DIAGNOSIS — F411 Generalized anxiety disorder: Secondary | ICD-10-CM

## 2019-05-13 MED ORDER — CLORAZEPATE DIPOTASSIUM 3.75 MG PO TABS: 3.7500 mg | ORAL_TABLET | Freq: Every day | ORAL | 1 refills | Status: DC

## 2019-05-13 NOTE — Progress Notes (Signed)
   Subjective:    Patient ID: Kristen Caldwell, female    DOB: 10/27/1945, 74 y.o.   MRN: [id number]  HPI Chief Complaint  Patient presents with  . Follow-up    BP elevated - pt states that she has a lot going on with her husband.    This is a 74 year old female who presents today for follow-up of chronic medical problems.  She continues to be under a great deal of stress and pressure.  Her husband has dementia and has been at the [facility] for several months now.  While there, he contracted COVID-19.  She has not seen him in over 2 months.  She is trying to find a long-term care facility that will accept him in transfer.  This is difficult because he has anger outbursts.  The patient reports that she is sleeping okay off and on.  She continues to work 3 days a week at [employer].  Appetite unchanged.  She denies chest pain, shortness of breath, leg swelling.   Review of Systems Per HPI    Objective:   Physical Exam Vitals reviewed.  Constitutional:      Comments: Thin. Appears fatigued.   HENT:     Head: Normocephalic and atraumatic.  Cardiovascular:     Rate and Rhythm: Normal rate.     Heart sounds: Normal heart sounds.  Pulmonary:     Effort: Pulmonary effort is normal.     Breath sounds: Normal breath sounds.  Musculoskeletal:     Right lower leg: No edema.     Left lower leg: No edema.  Skin:    General: Skin is warm and dry.  Neurological:     Mental Status: She is alert and oriented to person, place, and time.  Psychiatric:        Mood and Affect: Mood normal.        Behavior: Behavior normal.        Thought Content: Thought content normal.        Judgment: Judgment normal.       BP (!) 142/90 (BP Location: Left Arm, Patient Position: Sitting, Cuff Size: Normal)   Pulse 78   Temp 97.9 F (36.6 C) (Temporal)   Ht 5\' 2"  (1.575 m)   Wt 96 lb (43.5 kg)   SpO2 99%   BMI 17.56 kg/m  Wt Readings from Last 3 Encounters:  05/13/19 96 lb (43.5  kg)  03/04/19 98 lb (44.5 kg)  11/09/18 97 lb 12.8 oz (44.4 kg)       Assessment & Plan:  1. Anxiety state -Been maintained on clorazepate daily for many years, continue - clorazepate (TRANXENE) 3.75 MG tablet; Take 1 tablet (3.75 mg total) by mouth daily.  Dispense: 90 tablet; Refill: 1  2. Caregiver stress -For support and encouragement for self-care  Follow-up in 6 months for CPE  This visit occurred during the SARS-CoV-2 public health emergency.  Safety protocols were in place, including screening questions prior to the visit, additional usage of staff PPE, and extensive cleaning of exam room while observing appropriate contact time as indicated for disinfecting solutions.      Clarene Reamer, FNP-BC  [facility] at [facility], [place]  05/13/2019 4:14 PM

## 2019-05-13 NOTE — Patient Instructions (Signed)
Good to see you today  Please follow up in 6 months for your annual exam

## 2019-05-18 ENCOUNTER — Other Ambulatory Visit: Payer: Self-pay

## 2019-05-18 ENCOUNTER — Ambulatory Visit: Payer: PPO | Attending: Internal Medicine

## 2019-05-18 DIAGNOSIS — Z23 Encounter for immunization: Secondary | ICD-10-CM | POA: Insufficient documentation

## 2019-05-18 NOTE — Progress Notes (Signed)
   Covid-19 Vaccination Clinic  Name:  Kristen Caldwell    MRN: [id number] DOB: 03-04-1946  05/18/2019  [name] was observed post Covid-19 immunization for 15 minutes without incident. She was provided with Vaccine Information Sheet and instruction to access the V-Safe system.   [name] was instructed to call 911 with any severe reactions post vaccine: Marland Kitchen Difficulty breathing  . Swelling of face and throat  . A fast heartbeat  . A bad rash all over body  . Dizziness and weakness   Immunizations Administered    Name Date Dose VIS Date Route   Pfizer COVID-19 Vaccine 05/18/2019 10:22 AM 0.3 mL 02/22/2019 Intramuscular   Manufacturer: [place]   Lot: [id number]   [place]: [id number]

## 2019-07-03 ENCOUNTER — Other Ambulatory Visit: Payer: Self-pay

## 2019-07-03 ENCOUNTER — Encounter: Payer: Self-pay | Admitting: Family Medicine

## 2019-07-03 ENCOUNTER — Ambulatory Visit (INDEPENDENT_AMBULATORY_CARE_PROVIDER_SITE_OTHER): Payer: PPO | Admitting: Family Medicine

## 2019-07-03 ENCOUNTER — Ambulatory Visit (INDEPENDENT_AMBULATORY_CARE_PROVIDER_SITE_OTHER)
Admission: RE | Admit: 2019-07-03 | Discharge: 2019-07-03 | Disposition: A | Discharge status: Home / Self Care | Payer: PPO | Source: Ambulatory Visit | Attending: Family Medicine | Admitting: Family Medicine

## 2019-07-03 VITALS — BP 132/78 | HR 84 | Temp 97.6°F | Ht 62.0 in | Wt 96.0 lb

## 2019-07-03 DIAGNOSIS — R2231 Localized swelling, mass and lump, right upper limb: Secondary | ICD-10-CM

## 2019-07-03 DIAGNOSIS — Z636 Dependent relative needing care at home: Secondary | ICD-10-CM | POA: Diagnosis not present

## 2019-07-03 DIAGNOSIS — M19041 Primary osteoarthritis, right hand: Secondary | ICD-10-CM | POA: Diagnosis not present

## 2019-07-03 NOTE — Patient Instructions (Signed)
Good to see you today  I will call you with your xray report  Continue to keep covered when working.   Can try heat/ ice  Let me know if anything changes

## 2019-07-03 NOTE — Progress Notes (Signed)
   Subjective:    Patient ID: Kristen Caldwell, female    DOB: [birth year], 74 y.o.   MRN: [id number]  HPI Chief Complaint  Patient presents with  . Thumb pain    Right thumb pain, redness & swelling x1 week - no injury    Right thumb has been red, swollen x 1 week, doesn't recall any injury or foreign body. Not getting worse. A little uncomfortable, not really painful. No fever/ chills/ drainage/ streaking.   Has had both covid vaccines. Had some transient side effects, all resolved now.   Her husband has been moved from the [facility] to a SNF in [place]. She was able to have an outside visit with him but finds it hard to get information. Her son lives with her and his 57 yo daughter is there every other week. This is a lot of work for the patient.     Review of Systems Per HPI    Objective:   Physical Exam Vitals reviewed.  Constitutional:      Appearance: Normal appearance.     Comments: Thin.   HENT:     Head: Normocephalic and atraumatic.  Eyes:     Conjunctiva/sclera: Conjunctivae normal.  Cardiovascular:     Rate and Rhythm: Normal rate.  Pulmonary:     Effort: Pulmonary effort is normal.  Musculoskeletal:     Right hand: Swelling present.     Comments: Right thumb with redness and swelling on distal, palmar surface. No streaking ,warmth, focal lesion, drainage. Normal nail bed. Normal ROM.   Skin:    General: Skin is warm and dry.  Neurological:     Mental Status: She is alert and oriented to person, place, and time.  Psychiatric:        Mood and Affect: Mood normal.        Behavior: Behavior normal.        Thought Content: Thought content normal.        Judgment: Judgment normal.       BP 132/78 (BP Location: Left Arm, Patient Position: Sitting, Cuff Size: Normal)   Pulse 84   Temp 97.6 F (36.4 C) (Temporal)   Ht 5\' 2"  (1.575 m)   Wt 96 lb (43.5 kg)   SpO2 98%   BMI 17.56 kg/m  Wt Readings from Last 3 Encounters:  07/03/19 96 lb  (43.5 kg)  05/13/19 96 lb (43.5 kg)  03/04/19 98 lb (44.5 kg)       Assessment & Plan:  1. Localized swelling of right thumb - unclear etiology, will get xray to rule out joint infection - follow up precautions reviewed - continue NSAIDs, can try ice/ heat prn - DG Finger Thumb Right; Future  2. Caregiver stress - supportive listening, encouraged self care, setting boundaries.  - follow up on file for cpe/ labs/ AWV  This visit occurred during the SARS-CoV-2 public health emergency.  Safety protocols were in place, including screening questions prior to the visit, additional usage of staff PPE, and extensive cleaning of exam room while observing appropriate contact time as indicated for disinfecting solutions.       Clarene Reamer, FNP-BC  [facility] at [facility], [place] [facility]  07/03/2019 1:04 PM

## 2019-07-29 ENCOUNTER — Telehealth (INDEPENDENT_AMBULATORY_CARE_PROVIDER_SITE_OTHER): Payer: PPO | Admitting: Family Medicine

## 2019-07-29 ENCOUNTER — Encounter: Payer: Self-pay | Admitting: Family Medicine

## 2019-07-29 VITALS — Temp 98.6°F | Wt 105.0 lb

## 2019-07-29 DIAGNOSIS — J22 Unspecified acute lower respiratory infection: Secondary | ICD-10-CM | POA: Diagnosis not present

## 2019-07-29 MED ORDER — AZITHROMYCIN 250 MG PO TABS: ORAL_TABLET | ORAL | 0 refills | Status: DC

## 2019-07-29 MED ORDER — BENZONATATE 100 MG PO CAPS: 100.0000 mg | ORAL_CAPSULE | Freq: Three times a day (TID) | ORAL | 0 refills | Status: DC | PRN

## 2019-07-29 NOTE — Progress Notes (Signed)
Virtual Visit via Video Note  I connected with Kristen Caldwell on 07/29/19 at 10:30 AM EDT by a video enabled telemedicine application and verified that I am speaking with the correct person using two identifiers.  Location: Patient: :In her home Provider: [place] Patients participating in virtual visit: Patient, provider, DNP Student, Saralyn Pilar   I discussed the limitations of evaluation and management by telemedicine and the availability of in person appointments. The patient expressed understanding and agreed to proceed.  History of Present Illness: Chief Complaint  Patient presents with  . Sinusitis    x almost 1 week, worsening. Pt has developed a cough, productive yellow mucous. Denies SOB, wheezing or chest tightness. Pt is having runny nose, frontal sinus pressure, headache. No fever. Pt has tried Zyrtec and Robitussin DM.  OTC Covid test, negative  This is a 74 yo female who presents today for virtual visit. She has been having runny nose, cough, sinus congestion, sinus pain. Has had progressively more sputum production with yellow sputum all throughout the day. No fever or muscle aches. Some pain in right side rib cage with cough. A little improvement with robitussin DM. Feels like she is getting worse instead of better.   She has been able to see her husband weekly. He is bed bound, able to drink but not feed himself. She understands that he is unable to come home.     Observations/Objective: Patient is alert and answers questions appropriately. Sounds mildly congested. Respirations even and unlabored, dry cough witnessed, no audible wheezing. Mood and affect appropriate.   Temp 98.6 F (37 C) (Temporal)   Wt 105 lb (47.6 kg)   BMI 19.20 kg/m   Assessment and Plan: 1. Lower respiratory infection - discussed course of illness with patient, she is concerned about worsening and infection.  - discussed expectations of therapy, follow up precautions -  azithromycin (ZITHROMAX) 250 MG tablet; Take 2 tabs PO x 1 dose, then 1 tab PO QD x 4 days  Dispense: 6 tablet; Refill: 0 - benzonatate (TESSALON) 100 MG capsule; Take 1-2 capsules (100-200 mg total) by mouth 3 (three) times daily as needed.  Dispense: 20 capsule; Refill: 0   Clarene Reamer, FNP-BC  [facility] at [facility], [place] [facility]  07/29/2019 11:10 AM   Follow Up Instructions:    I discussed the assessment and treatment plan with the patient. The patient was provided an opportunity to ask questions and all were answered. The patient agreed with the plan and demonstrated an understanding of the instructions.   The patient was advised to call back or seek an in-person evaluation if the symptoms worsen or if the condition fails to improve as anticipated.   Elby Beck, FNP

## 2019-08-26 DIAGNOSIS — Z1231 Encounter for screening mammogram for malignant neoplasm of breast: Secondary | ICD-10-CM | POA: Diagnosis not present

## 2019-08-28 ENCOUNTER — Encounter: Payer: Self-pay | Admitting: Family Medicine

## 2019-08-28 ENCOUNTER — Other Ambulatory Visit: Payer: Self-pay

## 2019-08-28 ENCOUNTER — Ambulatory Visit (INDEPENDENT_AMBULATORY_CARE_PROVIDER_SITE_OTHER): Payer: PPO | Admitting: Family Medicine

## 2019-08-28 VITALS — BP 136/86 | HR 94 | Temp 97.9°F | Ht 62.0 in | Wt 97.4 lb

## 2019-08-28 DIAGNOSIS — R634 Abnormal weight loss: Secondary | ICD-10-CM

## 2019-08-28 DIAGNOSIS — R5383 Other fatigue: Secondary | ICD-10-CM | POA: Diagnosis not present

## 2019-08-28 DIAGNOSIS — M810 Age-related osteoporosis without current pathological fracture: Secondary | ICD-10-CM | POA: Diagnosis not present

## 2019-08-28 DIAGNOSIS — H6991 Unspecified Eustachian tube disorder, right ear: Secondary | ICD-10-CM

## 2019-08-28 DIAGNOSIS — H6981 Other specified disorders of Eustachian tube, right ear: Secondary | ICD-10-CM

## 2019-08-28 MED ORDER — FLUTICASONE PROPIONATE 50 MCG/ACT NA SUSP: 2.0000 | Freq: Every day | NASAL | 6 refills | Status: DC

## 2019-08-28 NOTE — Patient Instructions (Addendum)
Good to see you today   Eustachian Tube Dysfunction  Eustachian tube dysfunction refers to a condition in which a blockage develops in the narrow passage that connects the middle ear to the back of the nose (eustachian tube). The eustachian tube regulates air pressure in the middle ear by letting air move between the ear and nose. It also helps to drain fluid from the middle ear space. Eustachian tube dysfunction can affect one or both ears. When the eustachian tube does not function properly, air pressure, fluid, or both can build up in the middle ear. What are the causes? This condition occurs when the eustachian tube becomes blocked or cannot open normally. Common causes of this condition include:  Ear infections.  Colds and other infections that affect the nose, mouth, and throat (upper respiratory tract).  Allergies.  Irritation from cigarette smoke.  Irritation from stomach acid coming up into the esophagus (gastroesophageal reflux). The esophagus is the tube that carries food from the mouth to the stomach.  Sudden changes in air pressure, such as from descending in an airplane or scuba diving.  Abnormal growths in the nose or throat, such as: ? Growths that line the nose (nasal polyps). ? Abnormal growth of cells (tumors). ? Enlarged tissue at the back of the throat (adenoids). What increases the risk? You are more likely to develop this condition if:  You smoke.  You are overweight.  You are a child who has: ? Certain birth defects of the mouth, such as cleft palate. ? Large tonsils or adenoids. What are the signs or symptoms? Common symptoms of this condition include:  A feeling of fullness in the ear.  Ear pain.  Clicking or popping noises in the ear.  Ringing in the ear.  Hearing loss.  Loss of balance.  Dizziness. Symptoms may get worse when the air pressure around you changes, such as when you travel to an area of high elevation, fly on an airplane, or  go scuba diving. How is this diagnosed? This condition may be diagnosed based on:  Your symptoms.  A physical exam of your ears, nose, and throat.  Tests, such as those that measure: ? The movement of your eardrum (tympanogram). ? Your hearing (audiometry). How is this treated? Treatment depends on the cause and severity of your condition.  In mild cases, you may relieve your symptoms by moving air into your ears. This is called "popping the ears."  In more severe cases, or if you have symptoms of fluid in your ears, treatment may include: ? Medicines to relieve congestion (decongestants). ? Medicines that treat allergies (antihistamines). ? Nasal sprays or ear drops that contain medicines that reduce swelling (steroids). ? A procedure to drain the fluid in your eardrum (myringotomy). In this procedure, a small tube is placed in the eardrum to:  Drain the fluid.  Restore the air in the middle ear space. ? A procedure to insert a balloon device through the nose to inflate the opening of the eustachian tube (balloon dilation). Follow these instructions at home: Lifestyle  Do not do any of the following until your health care provider approves: ? Travel to high altitudes. ? Fly in airplanes. ? Work in a Pension scheme manager or room. ? Scuba dive.  Do not use any products that contain nicotine or tobacco, such as cigarettes and e-cigarettes. If you need help quitting, ask your health care provider.  Keep your ears dry. Wear fitted earplugs during showering and bathing. Dry your ears  completely after. General instructions  Take over-the-counter and prescription medicines only as told by your health care provider.  Use techniques to help pop your ears as recommended by your health care provider. These may include: ? Chewing gum. ? Yawning. ? Frequent, forceful swallowing. ? Closing your mouth, holding your nose closed, and gently blowing as if you are trying to blow air out of  your nose.  Keep all follow-up visits as told by your health care provider. This is important. Contact a health care provider if:  Your symptoms do not go away after treatment.  Your symptoms come back after treatment.  You are unable to pop your ears.  You have: ? A fever. ? Pain in your ear. ? Pain in your head or neck. ? Fluid draining from your ear.  Your hearing suddenly changes.  You become very dizzy.  You lose your balance. Summary  Eustachian tube dysfunction refers to a condition in which a blockage develops in the eustachian tube.  It can be caused by ear infections, allergies, inhaled irritants, or abnormal growths in the nose or throat.  Symptoms include ear pain, hearing loss, or ringing in the ears.  Mild cases are treated with maneuvers to unblock the ears, such as yawning or ear popping.  Severe cases are treated with medicines. Surgery may also be done (rare). This information is not intended to replace advice given to you by your health care provider. Make sure you discuss any questions you have with your health care provider. Document Revised: 06/20/2017 Document Reviewed: 06/20/2017 Elsevier Patient Education  [place].

## 2019-08-28 NOTE — Progress Notes (Signed)
Subjective:    Patient ID: Kristen Caldwell, female    DOB: 02-Mar-1946, 74 y.o.   MRN: [id number]  HPI Chief Complaint  Patient presents with  . Follow-up    Discuss B12 levels. Feels run down, no energy / Pt c/o "buzzing" in right ear   Buzzing in right ear x 1 week, notices more at night. No pain, no drainage. Had uri last month. Symptoms resolved. Takes Zyrtec 1/2 tablet most days.   Has been feeling run down. Continues to visit husband in SNF, take care of grand daughter, work 2-3 days per week. Happy to get out of the house to go to work. Has high stress level trying to navigate SNF for her husband. Poor sleep, some improvement with amitriptyline 25-50 mg po qhs and essential oils. Eats ok, not large amounts, 3 meals a day and occasional snacks. Doesn't like much meat but will eat greek yogurt, eggs, chicken salad, bread, potatoes.    Review of Systems    per HPI Objective:   Physical Exam Vitals reviewed.  Constitutional:      General: She is not in acute distress.    Appearance: Normal appearance. She is underweight. She is not ill-appearing, toxic-appearing or diaphoretic.  HENT:     Head: Normocephalic and atraumatic.     Right Ear: Ear canal and external ear normal. There is no impacted cerumen.     Left Ear: Tympanic membrane, ear canal and external ear normal. There is no impacted cerumen.     Ears:     Comments: Left TM dull. Non tender to exam.  Eyes:     Conjunctiva/sclera: Conjunctivae normal.  Cardiovascular:     Rate and Rhythm: Normal rate and regular rhythm.     Heart sounds: Normal heart sounds.  Pulmonary:     Effort: Pulmonary effort is normal.     Breath sounds: Normal breath sounds.  Musculoskeletal:     Cervical back: Normal range of motion and neck supple. No rigidity or tenderness.  Lymphadenopathy:     Cervical: No cervical adenopathy.  Skin:    General: Skin is warm and dry.  Neurological:     Mental Status: She is alert and  oriented to person, place, and time.  Psychiatric:        Mood and Affect: Mood normal.        Behavior: Behavior normal.        Thought Content: Thought content normal.        Judgment: Judgment normal.          BP 136/86 (BP Location: Left Arm, Patient Position: Sitting, Cuff Size: Normal)   Pulse 94   Temp 97.9 F (36.6 C) (Temporal)   Ht 5\' 2"  (1.575 m)   Wt 97 lb 6.4 oz (44.2 kg)   SpO2 96%   BMI 17.81 kg/m  Wt Readings from Last 3 Encounters:  08/28/19 97 lb 6.4 oz (44.2 kg)  07/29/19 105 lb (47.6 kg)  07/03/19 96 lb (43.5 kg)    Assessment & Plan:  1. Fatigue, unspecified type - will check labs, likely related to significant stress and anxiety. Did not tolerate sertraline, if labs ok and patient agreeable, can consider trying fluoxetine.  - Vitamin B12 - CBC with Differential - Comprehensive metabolic panel - TSH  2. Osteoporosis without current pathological fracture, unspecified osteoporosis type - Comprehensive metabolic panel - VITAMIN D 25 Hydroxy (Vit-D Deficiency, Fractures)  3. Weight loss, unintentional - Vitamin B12 - CBC  with Differential - Comprehensive metabolic panel - TSH  4. Acute dysfunction of right eustachian tube - fluticasone (FLONASE) 50 MCG/ACT nasal spray; Place 2 sprays into both nostrils daily.  Dispense: 16 g; Refill: 6  This visit occurred during the SARS-CoV-2 public health emergency.  Safety protocols were in place, including screening questions prior to the visit, additional usage of staff PPE, and extensive cleaning of exam room while observing appropriate contact time as indicated for disinfecting solutions.     Clarene Reamer, FNP-BC  [facility] at [facility], [place] [facility]  08/28/2019 3:00 PM

## 2019-08-29 LAB — TSH: TSH: 2.51 u[IU]/mL (ref 0.35–4.50)

## 2019-08-29 LAB — CBC WITH DIFFERENTIAL/PLATELET
Basophils Absolute: 0.1 10*3/uL (ref 0.0–0.1)
Basophils Relative: 1.1 % (ref 0.0–3.0)
Eosinophils Absolute: 0.1 10*3/uL (ref 0.0–0.7)
Eosinophils Relative: 2.1 % (ref 0.0–5.0)
HCT: 40.3 % (ref 36.0–46.0)
Hemoglobin: 13.6 g/dL (ref 12.0–15.0)
Lymphocytes Relative: 25.6 % (ref 12.0–46.0)
Lymphs Abs: 1.6 10*3/uL (ref 0.7–4.0)
MCHC: 33.7 g/dL (ref 30.0–36.0)
MCV: 91.5 fl (ref 78.0–100.0)
Monocytes Absolute: 0.5 10*3/uL (ref 0.1–1.0)
Monocytes Relative: 7.3 % (ref 3.0–12.0)
Neutro Abs: 4 10*3/uL (ref 1.4–7.7)
Neutrophils Relative %: 63.9 % (ref 43.0–77.0)
Platelets: 212 10*3/uL (ref 150.0–400.0)
RBC: 4.41 Mil/uL (ref 3.87–5.11)
RDW: 12.8 % (ref 11.5–15.5)
WBC: 6.2 10*3/uL (ref 4.0–10.5)

## 2019-08-29 LAB — COMPREHENSIVE METABOLIC PANEL
ALT: 13 U/L (ref 0–35)
AST: 18 U/L (ref 0–37)
Albumin: 4.3 g/dL (ref 3.5–5.2)
Alkaline Phosphatase: 81 U/L (ref 39–117)
BUN: 20 mg/dL (ref 6–23)
CO2: 27 mEq/L (ref 19–32)
Calcium: 9.5 mg/dL (ref 8.4–10.5)
Chloride: 101 mEq/L (ref 96–112)
Creatinine, Ser: 0.96 mg/dL (ref 0.40–1.20)
GFR: 56.9 mL/min — ABNORMAL LOW (ref >60.00)
Glucose, Bld: 111 mg/dL — ABNORMAL HIGH (ref 70–99)
Potassium: 3.9 mEq/L (ref 3.5–5.1)
Sodium: 137 mEq/L (ref 135–145)
Total Bilirubin: 0.3 mg/dL (ref 0.2–1.2)
Total Protein: 7.5 g/dL (ref 6.0–8.3)

## 2019-08-29 LAB — VITAMIN B12: Vitamin B-12: 542 pg/mL (ref 211–911)

## 2019-08-29 LAB — VITAMIN D 25 HYDROXY (VIT D DEFICIENCY, FRACTURES): VITD: 61.48 ng/mL (ref 30.00–100.00)

## 2019-09-06 LAB — HM MAMMOGRAPHY

## 2019-10-07 ENCOUNTER — Telehealth: Payer: Self-pay

## 2019-10-07 NOTE — Telephone Encounter (Signed)
Kristen Caldwell notified as instructed by telephone.  She will try increasing the Tylenol to four times a day to see if that helps.  Currently she was only taking it 2 times a day without any relief.  She will call back if increasing her dose does not work.

## 2019-10-07 NOTE — Telephone Encounter (Signed)
Patient contacted the office and states that Kristen Caldwell advised that she stop taking tylenol for her pain. Patient states she has stopped this for awhile, but she is having lots of pain in her back, joints, and arms. She is wondering if there is anything else that Kristen Caldwell recommends for her pain, or if she can send something in for her?

## 2019-10-07 NOTE — Telephone Encounter (Signed)
Please call patient and tell her that it is fine for her to take Tylenol/ acetaminophen. I want her to avoid NSAIDs because of her slightly decreased kidney function. NSAIDs include ibuprofen, Advil, naproxen, Alleve. It is fine for her to take up to 4,000 mg of Tylenol or generic acetaminophen in 24 hours.

## 2019-10-08 NOTE — Telephone Encounter (Signed)
Noted  

## 2019-10-10 ENCOUNTER — Encounter: Payer: Self-pay | Admitting: Family Medicine

## 2019-11-03 ENCOUNTER — Other Ambulatory Visit: Payer: Self-pay | Admitting: Family Medicine

## 2019-11-03 DIAGNOSIS — E785 Hyperlipidemia, unspecified: Secondary | ICD-10-CM

## 2019-11-03 DIAGNOSIS — R944 Abnormal results of kidney function studies: Secondary | ICD-10-CM

## 2019-11-04 ENCOUNTER — Other Ambulatory Visit: Payer: Self-pay

## 2019-11-04 ENCOUNTER — Ambulatory Visit: Payer: PPO

## 2019-11-04 ENCOUNTER — Other Ambulatory Visit (INDEPENDENT_AMBULATORY_CARE_PROVIDER_SITE_OTHER): Payer: PPO

## 2019-11-04 DIAGNOSIS — E785 Hyperlipidemia, unspecified: Secondary | ICD-10-CM

## 2019-11-04 DIAGNOSIS — R944 Abnormal results of kidney function studies: Secondary | ICD-10-CM

## 2019-11-04 LAB — BASIC METABOLIC PANEL
BUN: 16 mg/dL (ref 6–23)
CO2: 26 mEq/L (ref 19–32)
Calcium: 9.4 mg/dL (ref 8.4–10.5)
Chloride: 104 mEq/L (ref 96–112)
Creatinine, Ser: 0.93 mg/dL (ref 0.40–1.20)
GFR: 59 mL/min — ABNORMAL LOW (ref >60.00)
Glucose, Bld: 102 mg/dL — ABNORMAL HIGH (ref 70–99)
Potassium: 4 mEq/L (ref 3.5–5.1)
Sodium: 138 mEq/L (ref 135–145)

## 2019-11-04 LAB — LIPID PANEL
Cholesterol: 207 mg/dL — ABNORMAL HIGH (ref 0–200)
HDL: 67.7 mg/dL (ref >39.00)
LDL Cholesterol: 115 mg/dL — ABNORMAL HIGH (ref 0–99)
NonHDL: 139.05
Total CHOL/HDL Ratio: 3
Triglycerides: 118 mg/dL (ref 0.0–149.0)
VLDL: 23.6 mg/dL (ref 0.0–40.0)

## 2019-11-05 ENCOUNTER — Ambulatory Visit: Payer: PPO

## 2019-11-08 ENCOUNTER — Other Ambulatory Visit: Payer: Self-pay

## 2019-11-08 ENCOUNTER — Ambulatory Visit (INDEPENDENT_AMBULATORY_CARE_PROVIDER_SITE_OTHER): Payer: PPO

## 2019-11-08 DIAGNOSIS — Z0000 Encounter for general adult medical examination without abnormal findings: Secondary | ICD-10-CM

## 2019-11-08 NOTE — Patient Instructions (Signed)
Kristen Caldwell , Thank you for taking time to come for your Medicare Wellness Visit. I appreciate your ongoing commitment to your health goals. Please review the following plan we discussed and let me know if I can assist you in the future.   Screening recommendations/referrals: Colonoscopy: Up to date, completed 05/12/2010, due 04/2020 Mammogram: Up to date, completed 09/06/2019, due 08/2020 Bone Density: due, will get at next year's mammogram Recommended yearly ophthalmology/optometry visit for glaucoma screening and checkup Recommended yearly dental visit for hygiene and checkup  Vaccinations: Influenza vaccine: due, will in the office or at the pharmacy Pneumococcal vaccine: Completed series Tdap vaccine: Up to date, completed 07/01/2016, due 06/2026 Shingles vaccine: due, check with your insurance regarding coverage    Covid-19:Completed series  Advanced directives: Advance directive discussed with you today. Even though you declined this today please call our office should you change your mind and we can give you the proper paperwork for you to fill out.  Conditions/risks identified: hyperlipidemia  Next appointment: Follow up in one year for your annual wellness visit    Preventive Care 65 Years and Older, Female Preventive care refers to lifestyle choices and visits with your health care provider that can promote health and wellness. What does preventive care include?  A yearly physical exam. This is also called an annual well check.  Dental exams once or twice a year.  Routine eye exams. Ask your health care provider how often you should have your eyes checked.  Personal lifestyle choices, including:  Daily care of your teeth and gums.  Regular physical activity.  Eating a healthy diet.  Avoiding tobacco and drug use.  Limiting alcohol use.  Practicing safe sex.  Taking low-dose aspirin every day.  Taking vitamin and mineral supplements as recommended by your health  care provider. What happens during an annual well check? The services and screenings done by your health care provider during your annual well check will depend on your age, overall health, lifestyle risk factors, and family history of disease. Counseling  Your health care provider may ask you questions about your:  Alcohol use.  Tobacco use.  Drug use.  Emotional well-being.  Home and relationship well-being.  Sexual activity.  Eating habits.  History of falls.  Memory and ability to understand (cognition).  Work and work Statistician.  Reproductive health. Screening  You may have the following tests or measurements:  Height, weight, and BMI.  Blood pressure.  Lipid and cholesterol levels. These may be checked every 5 years, or more frequently if you are over 37 years old.  Skin check.  Lung cancer screening. You may have this screening every year starting at age 20 if you have a 30-pack-year history of smoking and currently smoke or have quit within the past 15 years.  Fecal occult blood test (FOBT) of the stool. You may have this test every year starting at age 37.  Flexible sigmoidoscopy or colonoscopy. You may have a sigmoidoscopy every 5 years or a colonoscopy every 10 years starting at age 21.  Hepatitis C blood test.  Hepatitis B blood test.  Sexually transmitted disease (STD) testing.  Diabetes screening. This is done by checking your blood sugar (glucose) after you have not eaten for a while (fasting). You may have this done every 1-3 years.  Bone density scan. This is done to screen for osteoporosis. You may have this done starting at age 77.  Mammogram. This may be done every 1-2 years. Talk to your health  care provider about how often you should have regular mammograms. Talk with your health care provider about your test results, treatment options, and if necessary, the need for more tests. Vaccines  Your health care provider may recommend certain  vaccines, such as:  Influenza vaccine. This is recommended every year.  Tetanus, diphtheria, and acellular pertussis (Tdap, Td) vaccine. You may need a Td booster every 10 years.  Zoster vaccine. You may need this after age 70.  Pneumococcal 13-valent conjugate (PCV13) vaccine. One dose is recommended after age 51.  Pneumococcal polysaccharide (PPSV23) vaccine. One dose is recommended after age 14. Talk to your health care provider about which screenings and vaccines you need and how often you need them. This information is not intended to replace advice given to you by your health care provider. Make sure you discuss any questions you have with your health care provider. Document Released: 03/27/2015 Document Revised: 11/18/2015 Document Reviewed: 12/30/2014 Elsevier Interactive Patient Education  2017 [place] Prevention in the Home Falls can cause injuries. They can happen to people of all ages. There are many things you can do to make your home safe and to help prevent falls. What can I do on the outside of my home?  Regularly fix the edges of walkways and driveways and fix any cracks.  Remove anything that might make you trip as you walk through a door, such as a raised step or threshold.  Trim any bushes or trees on the path to your home.  Use bright outdoor lighting.  Clear any walking paths of anything that might make someone trip, such as rocks or tools.  Regularly check to see if handrails are loose or broken. Make sure that both sides of any steps have handrails.  Any raised decks and porches should have guardrails on the edges.  Have any leaves, snow, or ice cleared regularly.  Use sand or salt on walking paths during winter.  Clean up any spills in your garage right away. This includes oil or grease spills. What can I do in the bathroom?  Use night lights.  Install grab bars by the toilet and in the tub and shower. Do not use towel bars as grab  bars.  Use non-skid mats or decals in the tub or shower.  If you need to sit down in the shower, use a plastic, non-slip stool.  Keep the floor dry. Clean up any water that spills on the floor as soon as it happens.  Remove soap buildup in the tub or shower regularly.  Attach bath mats securely with double-sided non-slip rug tape.  Do not have throw rugs and other things on the floor that can make you trip. What can I do in the bedroom?  Use night lights.  Make sure that you have a light by your bed that is easy to reach.  Do not use any sheets or blankets that are too big for your bed. They should not hang down onto the floor.  Have a firm chair that has side arms. You can use this for support while you get dressed.  Do not have throw rugs and other things on the floor that can make you trip. What can I do in the kitchen?  Clean up any spills right away.  Avoid walking on wet floors.  Keep items that you use a lot in easy-to-reach places.  If you need to reach something above you, use a strong step stool that has a grab  bar.  Keep electrical cords out of the way.  Do not use floor polish or wax that makes floors slippery. If you must use wax, use non-skid floor wax.  Do not have throw rugs and other things on the floor that can make you trip. What can I do with my stairs?  Do not leave any items on the stairs.  Make sure that there are handrails on both sides of the stairs and use them. Fix handrails that are broken or loose. Make sure that handrails are as long as the stairways.  Check any carpeting to make sure that it is firmly attached to the stairs. Fix any carpet that is loose or worn.  Avoid having throw rugs at the top or bottom of the stairs. If you do have throw rugs, attach them to the floor with carpet tape.  Make sure that you have a light switch at the top of the stairs and the bottom of the stairs. If you do not have them, ask someone to add them for  you. What else can I do to help prevent falls?  Wear shoes that:  Do not have high heels.  Have rubber bottoms.  Are comfortable and fit you well.  Are closed at the toe. Do not wear sandals.  If you use a stepladder:  Make sure that it is fully opened. Do not climb a closed stepladder.  Make sure that both sides of the stepladder are locked into place.  Ask someone to hold it for you, if possible.  Clearly mark and make sure that you can see:  Any grab bars or handrails.  First and last steps.  Where the edge of each step is.  Use tools that help you move around (mobility aids) if they are needed. These include:  Canes.  Walkers.  Scooters.  Crutches.  Turn on the lights when you go into a dark area. Replace any light bulbs as soon as they burn out.  Set up your furniture so you have a clear path. Avoid moving your furniture around.  If any of your floors are uneven, fix them.  If there are any pets around you, be aware of where they are.  Review your medicines with your doctor. Some medicines can make you feel dizzy. This can increase your chance of falling. Ask your doctor what other things that you can do to help prevent falls. This information is not intended to replace advice given to you by your health care provider. Make sure you discuss any questions you have with your health care provider. Document Released: 12/25/2008 Document Revised: 08/06/2015 Document Reviewed: 04/04/2014 Elsevier Interactive Patient Education  2017 [employer].

## 2019-11-08 NOTE — Progress Notes (Signed)
PCP notes:  Health Maintenance: Flu- due Shingrix- due Dexa- due   Abnormal Screenings: none   Patient concerns: none   Nurse concerns: none   Next PCP appt.: 11/11/2019 @ 9:30 am

## 2019-11-08 NOTE — Progress Notes (Signed)
Subjective:   Kristen Caldwell is a 74 y.o. female who presents for Medicare Annual (Subsequent) preventive examination.  Review of Systems: N/A      I connected with the patient today by telephone and verified that I am speaking with the correct person using two identifiers. Location patient: home Location nurse: work Persons participating in the telephone visit: patient, nurse.   I discussed the limitations, risks, security and privacy concerns of performing an evaluation and management service by telephone and the availability of in person appointments. I also discussed with the patient that there may be a patient responsible charge related to this service. The patient expressed understanding and verbally consented to this telephonic visit.        Cardiac Risk Factors include: advanced age (>56men, >58 women);dyslipidemia     Objective:    Today's Vitals   There is no height or weight on file to calculate BMI.  Advanced Directives 11/08/2019 11/02/2018  Does Patient Have a Medical Advance Directive? No No  Would patient like information on creating a medical advance directive? No - Patient declined -    Current Medications (verified) Outpatient Encounter Medications as of 11/08/2019  Medication Sig  . amitriptyline (ELAVIL) 25 MG tablet Take 1 tablet (25 mg total) by mouth at bedtime as needed for sleep.  . Calcium Carbonate-Vit D-Min (CALTRATE 600+D PLUS) 600-400 MG-UNIT per tablet Take 2 tablets by mouth daily.   . clorazepate (TRANXENE) 3.75 MG tablet Take 1 tablet (3.75 mg total) by mouth daily.  Marland Kitchen estradiol (ESTRACE) 0.5 MG tablet Take 1 tablet (0.5 mg total) by mouth daily.  . fluticasone (FLONASE) 50 MCG/ACT nasal spray Place 2 sprays into both nostrils daily.  . Multiple Vitamins-Minerals (CENTRUM SILVER) CHEW Chew 1 tablet by mouth daily.    . Omega-3 Fatty Acids (FISH OIL PO) Take 1 capsule by mouth 2 (two) times daily.    Marland Kitchen omeprazole (PRILOSEC) 20 MG  capsule Take 1 capsule (20 mg total) by mouth daily.  Marland Kitchen topiramate (TOPAMAX) 25 MG tablet Take 1 tablet (25 mg total) by mouth 2 (two) times daily.  . naproxen sodium (ALEVE) 220 MG tablet Take 220 mg by mouth daily. (Patient not taking: Reported on 11/08/2019)   No facility-administered encounter medications on file as of 11/08/2019.    Allergies (verified) Amoxicillin, Cephalexin, Penicillins, Sertraline, and Promethazine hcl   History: Past Medical History:  Diagnosis Date  . Anxiety   . Chronic headaches   . Depression   . Diverticulosis   . GERD (gastroesophageal reflux disease)   . Osteoporosis    Past Surgical History:  Procedure Laterality Date  . ABDOMINAL HYSTERECTOMY  03/14/1989   partial  . COLONOSCOPY    . TUBAL LIGATION     Family History  Problem Relation Age of Onset  . Aneurysm Mother   . Stroke Father   . Hypertension Father   . Hypertension Brother   . Heart attack Brother   . Lupus Sister   . Diabetes Paternal Grandfather   . Breast cancer Paternal Aunt   . Colon cancer Neg Hx    Social History   Socioeconomic History  . Marital status: Married    Spouse name: Not on file  . Number of children: 2  . Years of education: Not on file  . Highest education level: Not on file  Occupational History  . Not on file  Tobacco Use  . Smoking status: Never Smoker  . Smokeless tobacco: Never Used  Vaping Use  . Vaping Use: Never used  Substance and Sexual Activity  . Alcohol use: No  . Drug use: No  . Sexual activity: Not Currently  Other Topics Concern  . Not on file  Social History Narrative   Desires CPR.   Would not want feeding tubes or prolonged life support.            Social Determinants of Health   Financial Resource Strain: Low Risk   . Difficulty of Paying Living Expenses: Not hard at all  Food Insecurity: No Food Insecurity  . Worried About Charity fundraiser in the Last Year: Never true  . Ran Out of Food in the Last Year:  Never true  Transportation Needs: No Transportation Needs  . Lack of Transportation (Medical): No  . Lack of Transportation (Non-Medical): No  Physical Activity: Inactive  . Days of Exercise per Week: 0 days  . Minutes of Exercise per Session: 0 min  Stress: No Stress Concern Present  . Feeling of Stress : Not at all  Social Connections:   . Frequency of Communication with Friends and Family: Not on file  . Frequency of Social Gatherings with Friends and Family: Not on file  . Attends Religious Services: Not on file  . Active Member of Clubs or Organizations: Not on file  . Attends Archivist Meetings: Not on file  . Marital Status: Not on file    Tobacco Counseling Counseling given: Not Answered   Clinical Intake:  Pre-visit preparation completed: Yes  Pain : No/denies pain     Nutritional Risks: None Diabetes: No  How often do you need to have someone help you when you read instructions, pamphlets, or other written materials from your doctor or pharmacy?: 1 - Never What is the last grade level you completed in school?: 12th  Diabetic: No Nutrition Risk Assessment:  Has the patient had any N/V/D within the last 2 months?  No  Does the patient have any non-healing wounds?  No  Has the patient had any unintentional weight loss or weight gain?  No   Diabetes:  Is the patient diabetic?  No  If diabetic, was a CBG obtained today?  N/A Did the patient bring in their glucometer from home?  N/A How often do you monitor your CBG's? N/A.   Financial Strains and Diabetes Management:  Are you having any financial strains with the device, your supplies or your medication? N/A.  Does the patient want to be seen by Chronic Care Management for management of their diabetes?  N/A Would the patient like to be referred to a Nutritionist or for Diabetic Management?  N/A    Interpreter Needed?: No  Information entered by :: CJohnson, LPN   Activities of Daily  Living In your present state of health, do you have any difficulty performing the following activities: 11/08/2019  Hearing? N  Vision? N  Difficulty concentrating or making decisions? N  Walking or climbing stairs? N  Dressing or bathing? N  Doing errands, shopping? N  Preparing Food and eating ? N  Using the Toilet? N  In the past six months, have you accidently leaked urine? N  Do you have problems with loss of bowel control? N  Managing your Medications? N  Managing your Finances? N  Housekeeping or managing your Housekeeping? N  Some recent data might be hidden    Patient Care Team: Elby Beck, FNP as PCP - General (Nurse Practitioner) Silvano Rusk  E, MD as Consulting Physician (Gastroenterology)  Indicate any recent Medical Services you may have received from other than [facility] providers in the past year (date may be approximate).     Assessment:   This is a routine wellness examination for [place].  Hearing/Vision screen  Hearing Screening   125Hz  250Hz  500Hz  1000Hz  2000Hz  3000Hz  4000Hz  6000Hz  8000Hz   Right ear:           Left ear:           Vision Screening Comments: Patient gets annual eye exams   Dietary issues and exercise activities discussed: Current Exercise Habits: The patient has a physically strenuous job, but has no regular exercise apart from work., Exercise limited by: None identified  Goals    . Patient Stated     11/02/2018, wants to stay healthy    . Patient Stated     11/08/2019, I will maintain and continue medications as prescribed.       Depression Screen PHQ 2/9 Scores 11/08/2019 11/02/2018 07/14/2017 06/29/2016 05/29/2014 05/23/2013 05/22/2012  PHQ - 2 Score 2 0 0 1 0 0 0  PHQ- 9 Score 2 1 - - - - -    Fall Risk Fall Risk  11/08/2019 11/02/2018 07/14/2017 06/29/2016 05/29/2014  Falls in the past year? 0 0 No No No  Number falls in past yr: 0 - - - -  Injury with Fall? 0 - - - -  Risk for fall due to : Medication side effect Medication side  effect - - -  Follow up Falls evaluation completed;Falls prevention discussed Falls evaluation completed;Falls prevention discussed - - -    Any stairs in or around the home? Yes  If so, are there any without handrails? No  Home free of loose throw rugs in walkways, pet beds, electrical cords, etc? Yes  Adequate lighting in your home to reduce risk of falls? Yes   ASSISTIVE DEVICES UTILIZED TO PREVENT FALLS:  Life alert? No  Use of a cane, walker or w/c? No  Grab bars in the bathroom? No  Shower chair or bench in shower? No  Elevated toilet seat or a handicapped toilet? No   TIMED UP AND GO:  Was the test performed? N/A, telephonic visit.    Cognitive Function: MMSE - Mini Mental State Exam 11/08/2019 11/02/2018  Not completed: Refused -  Orientation to time - 4  Orientation to Place - 5  Registration - 3  Attention/ Calculation - 5  Recall - 3  Language- name 2 objects - 0  Language- repeat - 1  Language- follow 3 step command - 0  Language- read & follow direction - 0  Write a sentence - 0  Copy design - 0  Total score - 21  Mini Cog  Mini-Cog screen was not completed. Patient refused. Maximum score is 22. A value of 0 denotes this part of the MMSE was not completed or the patient failed this part of the Mini-Cog screening.       Immunizations Immunization History  Administered Date(s) Administered  . Fluad Quad(high Dose 65+) 11/09/2018  . Influenza Split 01/31/2011, 12/15/2011  . Influenza Whole 12/10/2007, 03/10/2009, 12/08/2009  . Influenza,inj,Quad PF,6+ Mos 12/26/2012, 11/07/2013, 02/13/2015, 11/13/2015, 12/14/2017  . Influenza-Unspecified 12/19/2016  . PFIZER SARS-COV-2 Vaccination 04/27/2019, 05/18/2019  . Pneumococcal Conjugate-13 05/29/2014  . Pneumococcal Polysaccharide-23 04/25/2011  . Td 02/08/2006  . Tdap 07/01/2016  . Zoster 07/30/2009    TDAP status: Up to date Flu Vaccine status: due, will get  at physical  Pneumococcal vaccine status: Up  to date Covid-19 vaccine status: Completed vaccines  Qualifies for Shingles Vaccine? Yes   Zostavax completed Yes   Shingrix Completed?: No.    Education has been provided regarding the importance of this vaccine. Patient has been advised to call insurance company to determine out of pocket expense if they have not yet received this vaccine. Advised may also receive vaccine at local pharmacy or Health Dept. Verbalized acceptance and understanding.  Screening Tests Health Maintenance  Topic Date Due  . Hepatitis C Screening  Never done  . INFLUENZA VACCINE  10/13/2019  . COLONOSCOPY  05/11/2020  . MAMMOGRAM  09/05/2020  . TETANUS/TDAP  07/02/2026  . DEXA SCAN  Completed  . COVID-19 Vaccine  Completed  . PNA vac Low Risk Adult  Completed    Health Maintenance  Health Maintenance Due  Topic Date Due  . Hepatitis C Screening  Never done  . INFLUENZA VACCINE  10/13/2019    Colorectal cancer screening: Completed 05/12/2010. Repeat every 10 years Mammogram status: Completed 09/06/2019. Repeat every year Bone Density status: due, will complete at next year's mammogram appointment.   Lung Cancer Screening: (Low Dose CT Chest recommended if Age 53-80 years, 30 pack-year currently smoking OR have quit w/in 15 years.) does not qualify.    Additional Screening:  Hepatitis C Screening: does qualify; Completed due  Vision Screening: Recommended annual ophthalmology exams for early detection of glaucoma and other disorders of the eye. Is the patient up to date with their annual eye exam?  Yes  Who is the provider or what is the name of the office in which the patient attends annual eye exams? [facility] If pt is not established with a provider, would they like to be referred to a provider to establish care? No .   Dental Screening: Recommended annual dental exams for proper oral hygiene  Community Resource Referral / Chronic Care Management: CRR required this visit?  No   CCM  required this visit?  No      Plan:     I have personally reviewed and noted the following in the patient's chart:   . Medical and social history . Use of alcohol, tobacco or illicit drugs  . Current medications and supplements . Functional ability and status . Nutritional status . Physical activity . Advanced directives . List of other physicians . Hospitalizations, surgeries, and ER visits in previous 12 months . Vitals . Screenings to include cognitive, depression, and falls . Referrals and appointments  In addition, I have reviewed and discussed with patient certain preventive protocols, quality metrics, and best practice recommendations. A written personalized care plan for preventive services as well as general preventive health recommendations were provided to patient.   Due to this being a telephonic visit, the after visit summary with patients personalized plan was offered to patient via mail or my-chart. Patient preferred to pick up at office at next visit.  Andrez Grime, LPN   06/10/760

## 2019-11-11 ENCOUNTER — Other Ambulatory Visit: Payer: Self-pay

## 2019-11-11 ENCOUNTER — Other Ambulatory Visit: Payer: Self-pay | Admitting: Family Medicine

## 2019-11-11 ENCOUNTER — Ambulatory Visit (INDEPENDENT_AMBULATORY_CARE_PROVIDER_SITE_OTHER): Payer: PPO | Admitting: Family Medicine

## 2019-11-11 ENCOUNTER — Encounter: Payer: Self-pay | Admitting: Family Medicine

## 2019-11-11 VITALS — BP 136/92 | HR 97 | Temp 97.6°F | Ht 61.5 in | Wt 94.2 lb

## 2019-11-11 DIAGNOSIS — H9311 Tinnitus, right ear: Secondary | ICD-10-CM | POA: Diagnosis not present

## 2019-11-11 DIAGNOSIS — E2839 Other primary ovarian failure: Secondary | ICD-10-CM

## 2019-11-11 DIAGNOSIS — F411 Generalized anxiety disorder: Secondary | ICD-10-CM | POA: Diagnosis not present

## 2019-11-11 DIAGNOSIS — M720 Palmar fascial fibromatosis [Dupuytren]: Secondary | ICD-10-CM | POA: Diagnosis not present

## 2019-11-11 DIAGNOSIS — Z0000 Encounter for general adult medical examination without abnormal findings: Secondary | ICD-10-CM | POA: Diagnosis not present

## 2019-11-11 MED ORDER — CLORAZEPATE DIPOTASSIUM 3.75 MG PO TABS: 3.7500 mg | ORAL_TABLET | Freq: Every day | ORAL | 1 refills | Status: DC

## 2019-11-11 NOTE — Telephone Encounter (Signed)
Called pt to verify what pharmacy for clorazepate. [facility] on [address]

## 2019-11-11 NOTE — Patient Instructions (Addendum)
Get your shingles vaccine from your pharmacy  Please call and schedule an appointment for your bone density studyTeola Bradley- [phone]   Increase your flonase to 2 sprays in each nostril, let me know if you want a referral to ENT specialist  Follow up in 6 months

## 2019-11-11 NOTE — Telephone Encounter (Signed)
Called [facility] and canceled prescription.  Pt wants prescription sent to [facility] on [address].

## 2019-11-11 NOTE — Progress Notes (Signed)
Subjective:    Patient ID: Kristen Caldwell, female    DOB: May 12, 1945, 74 y.o.   MRN: [id number]  HPI Chief Complaint  Patient presents with  . Annual Exam   This is a 74 yo female who presents today for CPE.   Last CPE- 11/09/2018 Mammo- 08/2019 Colonoscopy- 05/12/2010 Tdap- 07/01/2016 Flu- annual Covid 19 vaccine- fully vaccinated Eye- annual, will get records Dental- regular  Diet- has lost a few pounds, good appetite, problems with her dental work, pain with chewing meat. Eats eggs, chicken, greek yogurt.   Anxiety- doing a little better with mood and sleep. Her husband is maintained in a SNF and she sees him weekly. He has settled in, but still sometimes asks to come home and yells at patient. He has been bed bound for months. Is able to feed himself.   MSK pain- shoulders, has not been able to take NSAIDs due to decreased creatine clearance. Has been taking Tumeric with improvement of symptoms.  Review of Systems  Constitutional: Negative.   HENT: Positive for congestion (daily Zyrtec) and ear pain (Right ear with buzzing, improved with fluticasone).   Eyes: Negative.   Respiratory: Negative.   Cardiovascular: Negative.   Gastrointestinal: Positive for constipation (longstanding, unchanged). Negative for blood in stool.  Endocrine: Negative.   Genitourinary: Negative.   Musculoskeletal: Positive for arthralgias.  Skin:       Callous of right thumb, thinks related to scissor use. Nodules palm or right hand. Unchanged.        Objective:   Physical Exam Physical Exam  Constitutional: She is oriented to person, place, and time. She is thin. No distress.  HENT:  Head: Normocephalic and atraumatic.  Right Ear: External ear normal. Slightly opaque area at bottom of TM with few small bubble. No bulging or erythema.   Left Ear: External ear normal. TM normal.  Nose: Nose normal.  Mouth/Throat: Oropharynx is clear and moist. No oropharyngeal exudate.  Eyes:  Conjunctivae are normal.   Neck: Normal range of motion. Neck supple. No JVD present. No thyromegaly present.  Cardiovascular: Normal rate, regular rhythm, normal heart sounds and intact distal pulses.   Pulmonary/Chest: Effort normal and breath sounds normal. Right breast exhibits no inverted nipple, no mass, no nipple discharge, no skin change and no tenderness. Left breast exhibits no inverted nipple, no mass, no nipple discharge, no skin change and no tenderness. Breasts are symmetrical.  Abdominal: Soft. Bowel sounds are normal. She exhibits no distension and no mass. There is no tenderness. There is no rebound and no guarding.  Musculoskeletal: Normal range of motion. She exhibits no edema or tenderness. Normal gait, able to get on and off exam table without difficulty.  Lymphadenopathy:    She has no cervical adenopathy.  Neurological: She is alert and oriented to person, place, and time.   Skin: Skin is warm and dry. She is not diaphoretic. Slight thickening of right thumb pad, no erythema, right palm with nodules along tendon. Hand, fingers with normal ROM.  Psychiatric: She has a normal mood and affect. Her behavior is normal. Judgment and thought content normal.  Vitals reviewed.     BP (!) 136/92   Pulse 97   Temp 97.6 F (36.4 C)   Ht 5' 1.5" (1.562 m)   Wt 94 lb 4 oz (42.8 kg)   SpO2 97%   BMI 17.52 kg/m  Wt Readings from Last 3 Encounters:  11/11/19 94 lb 4 oz (42.8 kg)  08/28/19  97 lb 6.4 oz (44.2 kg)  07/29/19 105 lb (47.6 kg)   Depression screen [facility] 2/9 11/08/2019 11/02/2018 07/14/2017 06/29/2016 05/29/2014  Decreased Interest 1 0 0 0 0  Down, Depressed, Hopeless 1 0 0 1 0  PHQ - 2 Score 2 0 0 1 0  Altered sleeping 0 1 - - -  Tired, decreased energy 0 0 - - -  Change in appetite 0 0 - - -  Feeling bad or failure about yourself  0 0 - - -  Trouble concentrating 0 0 - - -  Moving slowly or fidgety/restless 0 0 - - -  Suicidal thoughts 0 0 - - -  PHQ-9 Score 2 1 - -  -  Difficult doing work/chores Somewhat difficult Not difficult at all - - -    Hearing Screening   Method: Audiometry   125Hz  250Hz  500Hz  1000Hz  2000Hz  3000Hz  4000Hz  6000Hz  8000Hz   Right ear:   20 20 20   0    Left ear:   0  20           Assessment & Plan:  1. Annual physical exam - reviewed recommended health maintenance- Shingles vaccine, Dexa scan  2. Estrogen deficiency - patient will call to schedule at [place]; Future  3. Anxiety state - doing better, encouraged self care - clorazepate (TRANXENE) 3.75 MG tablet; Take 1 tablet (3.75 mg total) by mouth daily.  Dispense: 90 tablet; Refill: 1  4. Ear noise/buzzing, right - she had decreased her fluticasone nasal spray to 1 spray in each nostril qd, she want to do a trial of 2 sprays in each nostril daily to see if any improvement, if not, she will let me know and I will put in referral to ENT  5. Dupuytren's contracture of right hand - no recent change, not bothering her. Discussed referral to hand specialist if increased size or limits on function of hand  - follow up in 6 months  This visit occurred during the SARS-CoV-2 public health emergency.  Safety protocols were in place, including screening questions prior to the visit, additional usage of staff PPE, and extensive cleaning of exam room while observing appropriate contact time as indicated for disinfecting solutions.      Clarene Reamer, FNP-BC  [facility]  11/11/2019 10:34 AM

## 2019-11-11 NOTE — Telephone Encounter (Signed)
Pt left v/m requesting cb that clorazepate was sent to [facility] [address] instead of [facility] [address].

## 2019-11-11 NOTE — Telephone Encounter (Signed)
Pt needs her clorazepate sent to [facility] [address].  It is cheaper there

## 2019-11-12 NOTE — Telephone Encounter (Addendum)
Medication was refilled yesterday 11/11/2019. Will forward to Debbie to deny refill.

## 2019-11-19 ENCOUNTER — Other Ambulatory Visit: Payer: Self-pay | Admitting: Family Medicine

## 2019-11-19 ENCOUNTER — Telehealth: Payer: Self-pay

## 2019-11-19 DIAGNOSIS — F411 Generalized anxiety disorder: Secondary | ICD-10-CM

## 2019-11-19 DIAGNOSIS — H9311 Tinnitus, right ear: Secondary | ICD-10-CM

## 2019-11-19 NOTE — Progress Notes (Signed)
I reviewed health advisor's note, was available for consultation, and agree with documentation and plan.  

## 2019-11-19 NOTE — Telephone Encounter (Signed)
Please call patient and tell her that I have placed ENT referral. She should get a call in 7-10 business days regarding an appointment.

## 2019-11-19 NOTE — Telephone Encounter (Signed)
Will you please call patient and see if she prefers to be seen in [place] or [place]?

## 2019-11-19 NOTE — Telephone Encounter (Signed)
Patient states she prefers [employer] location

## 2019-11-19 NOTE — Telephone Encounter (Signed)
Patient contacted the office and states she has fluid behind her ears, and she states that she would like a referral placed to ENT. She states that she has been feeling a little nauseas, and dizzy due to this, and Jackelyn Poling has mentioned an ENT referral if these sx do not improve.  Debbie, please advise.

## 2019-11-19 NOTE — Telephone Encounter (Signed)
Called pt and informed her the message from Tor Netters FNP.  Pt verbalized understanding but was concern about symptoms getting worse. I informed pt if symptoms become worse before ENT appt, call the office to see if we can see her or go to ER.

## 2019-11-27 DIAGNOSIS — H9313 Tinnitus, bilateral: Secondary | ICD-10-CM | POA: Diagnosis not present

## 2019-11-27 DIAGNOSIS — H903 Sensorineural hearing loss, bilateral: Secondary | ICD-10-CM | POA: Diagnosis not present

## 2019-11-27 DIAGNOSIS — H9201 Otalgia, right ear: Secondary | ICD-10-CM | POA: Diagnosis not present

## 2019-11-29 ENCOUNTER — Other Ambulatory Visit: Payer: Self-pay | Admitting: Family Medicine

## 2019-11-29 DIAGNOSIS — Z79890 Hormone replacement therapy (postmenopausal): Secondary | ICD-10-CM

## 2019-11-30 DIAGNOSIS — M810 Age-related osteoporosis without current pathological fracture: Secondary | ICD-10-CM | POA: Diagnosis not present

## 2019-11-30 DIAGNOSIS — M85831 Other specified disorders of bone density and structure, right forearm: Secondary | ICD-10-CM | POA: Diagnosis not present

## 2019-11-30 LAB — HM DEXA SCAN

## 2019-12-01 ENCOUNTER — Other Ambulatory Visit: Payer: Self-pay | Admitting: Family Medicine

## 2019-12-01 DIAGNOSIS — Z79890 Hormone replacement therapy (postmenopausal): Secondary | ICD-10-CM

## 2019-12-11 ENCOUNTER — Other Ambulatory Visit: Payer: Self-pay | Admitting: Family Medicine

## 2019-12-11 DIAGNOSIS — R519 Headache, unspecified: Secondary | ICD-10-CM

## 2019-12-12 NOTE — Telephone Encounter (Signed)
Pt last visit 11/01/2018 for chronic headache. Last refill 11/01/2018 #180 3 refills.  No upcoming appt.  How do you advise?

## 2019-12-13 ENCOUNTER — Other Ambulatory Visit: Payer: Self-pay | Admitting: Family Medicine

## 2019-12-13 DIAGNOSIS — R519 Headache, unspecified: Secondary | ICD-10-CM

## 2019-12-13 NOTE — Telephone Encounter (Signed)
Called pt informed her that her medication was sent to pharmacy.

## 2019-12-13 NOTE — Telephone Encounter (Signed)
Pt called checking on rx  

## 2019-12-13 NOTE — Telephone Encounter (Signed)
Please let patient know that her prescription has been sent to pharmacy.

## 2019-12-14 ENCOUNTER — Other Ambulatory Visit: Payer: Self-pay

## 2019-12-14 ENCOUNTER — Ambulatory Visit (INDEPENDENT_AMBULATORY_CARE_PROVIDER_SITE_OTHER): Payer: PPO

## 2019-12-14 DIAGNOSIS — Z23 Encounter for immunization: Secondary | ICD-10-CM | POA: Diagnosis not present

## 2019-12-27 ENCOUNTER — Encounter: Payer: Self-pay | Admitting: Family Medicine

## 2019-12-30 DIAGNOSIS — H2513 Age-related nuclear cataract, bilateral: Secondary | ICD-10-CM | POA: Diagnosis not present

## 2020-01-07 ENCOUNTER — Encounter: Payer: Self-pay | Admitting: Family Medicine

## 2020-01-07 ENCOUNTER — Ambulatory Visit (INDEPENDENT_AMBULATORY_CARE_PROVIDER_SITE_OTHER): Payer: PPO | Admitting: Family Medicine

## 2020-01-07 ENCOUNTER — Other Ambulatory Visit: Payer: Self-pay

## 2020-01-07 VITALS — BP 128/90 | HR 126 | Temp 97.0°F | Wt 96.2 lb

## 2020-01-07 DIAGNOSIS — G8929 Other chronic pain: Secondary | ICD-10-CM | POA: Diagnosis not present

## 2020-01-07 DIAGNOSIS — M545 Low back pain, unspecified: Secondary | ICD-10-CM | POA: Diagnosis not present

## 2020-01-07 DIAGNOSIS — N183 Chronic kidney disease, stage 3 unspecified: Secondary | ICD-10-CM | POA: Insufficient documentation

## 2020-01-07 DIAGNOSIS — N1831 Chronic kidney disease, stage 3a: Secondary | ICD-10-CM

## 2020-01-07 DIAGNOSIS — M810 Age-related osteoporosis without current pathological fracture: Secondary | ICD-10-CM

## 2020-01-07 NOTE — Patient Instructions (Signed)
Back pain - Tylenol 1000 mg (2 pills) twice daily - Take Aleve up to 2 times per day as needed for up to 2 weeks

## 2020-01-07 NOTE — Assessment & Plan Note (Signed)
Reviewed last labs. Discussed that would agree with PCP to limit daily NSAID use but would be OK to take for up to 2 weeks

## 2020-01-07 NOTE — Assessment & Plan Note (Signed)
No bony tenderness. But low threshold for XR if not improving.

## 2020-01-07 NOTE — Assessment & Plan Note (Addendum)
Reassuring exam w/o bony tenderness. Ok for short course of NSAIDs with CKD to see if symptoms improve. Strongly recommended PT if symptoms are not improving. She will call or mychart.

## 2020-01-07 NOTE — Progress Notes (Signed)
Subjective:     Kristen Caldwell is a 74 y.o. female presenting for Back Pain (lumbar x 2 days )     Back Pain This is a recurrent problem. The current episode started in the past 7 days. The pain is present in the lumbar spine. Quality: "a catch" The symptoms are aggravated by standing, bending and twisting. Pertinent negatives include no bladder incontinence, bowel incontinence, numbness, tingling or weakness. Risk factors include history of osteoporosis. She has tried heat (pain cream, brace, tumeric) for the symptoms.   Injury - bending over and carrying drinks to her husband and felt something in the back   Review of Systems  Gastrointestinal: Negative for bowel incontinence.  Genitourinary: Negative for bladder incontinence.  Musculoskeletal: Positive for back pain.  Neurological: Negative for tingling, weakness and numbness.     Social History   Tobacco Use  Smoking Status Never Smoker  Smokeless Tobacco Never Used        Objective:    BP Readings from Last 3 Encounters:  01/07/20 128/90  11/11/19 (!) 136/92  08/28/19 136/86   Wt Readings from Last 3 Encounters:  01/07/20 96 lb 4 oz (43.7 kg)  11/11/19 94 lb 4 oz (42.8 kg)  08/28/19 97 lb 6.4 oz (44.2 kg)    BP 128/90   Pulse (!) 126   Temp (!) 97 F (36.1 C) (Temporal)   Wt 96 lb 4 oz (43.7 kg)   SpO2 95%   BMI 17.89 kg/m    Physical Exam Constitutional:      General: She is not in acute distress.    Appearance: She is well-developed. She is not diaphoretic.  HENT:     Right Ear: External ear normal.     Left Ear: External ear normal.  Eyes:     Conjunctiva/sclera: Conjunctivae normal.  Cardiovascular:     Rate and Rhythm: Normal rate.  Pulmonary:     Effort: Pulmonary effort is normal.  Musculoskeletal:     Cervical back: Neck supple.     Comments: Back: Inspection: no skin changes Palpation: lumbar paraspinous Right ttp, no spinous ttp ROM: limited flexion, extension,  rotation 2/2 to pain Strength: mildly decreased hip flexors b/l, otherwise normal LE strength Reflexes normal Negative straight leg raise  Skin:    General: Skin is warm and dry.     Capillary Refill: Capillary refill takes less than 2 seconds.  Neurological:     Mental Status: She is alert. Mental status is at baseline.  Psychiatric:        Mood and Affect: Mood normal.        Behavior: Behavior normal.           Assessment & Plan:   Problem List Items Addressed This Visit      Musculoskeletal and Integument   Osteoporosis - Primary    No bony tenderness. But low threshold for XR if not improving.         Genitourinary   CKD (chronic kidney disease) stage 3, GFR 30-59 ml/min (HCC)    Reviewed last labs. Discussed that would agree with PCP to limit daily NSAID use but would be OK to take for up to 2 weeks        Other   Chronic bilateral low back pain without sciatica    Reassuring exam w/o bony tenderness. Ok for short course of NSAIDs with CKD to see if symptoms improve. Strongly recommended PT if symptoms are not improving. She will  call or mychart.           Return in about 6 years (around 01/06/2026), or if symptoms worsen or fail to improve.  [name], MD  This visit occurred during the SARS-CoV-2 public health emergency.  Safety protocols were in place, including screening questions prior to the visit, additional usage of staff PPE, and extensive cleaning of exam room while observing appropriate contact time as indicated for disinfecting solutions.

## 2020-01-13 ENCOUNTER — Telehealth: Payer: Self-pay

## 2020-01-13 NOTE — Telephone Encounter (Signed)
[facility] - Client TELEPHONE ADVICE RECORD [facility] Patient Name: Kristen Caldwell Gender: Female DOB: 01-Sep-1945 Age: 74 Y 9 M 22 D Return Phone Number: [phone] (Primary) [place] [address] Client [facility] - Client Client Site [place] - Day Physician Tor Netters- NP Contact Type Call Who Is Calling Patient / Member / Family / Caregiver Call Type Triage / Clinical Relationship To Patient Self Return Phone Number [phone] (Primary) Chief Complaint Dizziness Reason for Call Request to Reschedule Office Appointment Initial Comment Office is transferring pt with tingling in both feet. She has been feeling unwell, dizzy or lightheaded. Was seen for back pain and was taking Tylenol or Alieve. Translation No Nurse Assessment Nurse: Hardin Negus, RN, Mardene Celeste Date/Time [name] Time): 01/13/2020 9:46:07 AM Confirm and document reason for call. If symptomatic, describe symptoms. ---Office is transferring pt with tingling in both feet. She has been feeling unwell, dizzy or lightheaded. She was seen for back pain and was Alieve. She states as long as she eats she will feel better. Her feet continue to tingling. Does the patient have any new or worsening symptoms? ---Yes Will a triage be completed? ---Yes Related visit to physician within the last 2 weeks? ---No Does the PT have any chronic conditions? (i.e. diabetes, asthma, this includes High risk factors for pregnancy, etc.) ---Yes List chronic conditions. ---anxiety, osteoporosis, migraines Is this a behavioral health or substance abuse call? ---No Guidelines Guideline Title Affirmed Question Affirmed Notes Nurse Date/Time (Eastern Time) Neurologic Deficit [1] Numbness or tingling on both sides of body AND [2] is a new symptom present > 24 hours Oren Bracket 01/13/2020 9:48:50 AM Disp. Time  [name] Time) Disposition Final User 01/13/2020 9:55:25 AM SEE PCP WITHIN 3 DAYS Yes Hardin Negus, RN, Mardene Celeste PLEASE NOTE: All timestamps contained within this report are represented as [place] Standard Time. CONFIDENTIALTY NOTICE: This fax transmission is intended only for the addressee. It contains information that is legally privileged, confidential or otherwise protected from use or disclosure. If you are not the intended recipient, you are strictly prohibited from reviewing, disclosing, copying using or disseminating any of this information or taking any action in reliance on or regarding this information. If you have received this fax in error, please notify us immediately by telephone so that we can arrange for its return to [place]. Phone: [phone], Toll-Free: [phone], Fax: [phone] Page: 2 of 2 Call Id: [id number] [place] Disagree/Comply Comply Caller Understands Yes PreDisposition Call Doctor Care Advice Given Per Guideline SEE PCP WITHIN 3 DAYS: CARE ADVICE given per Neurologic Deficit (Adult) guideline. CALL BACK IF: * You become worse Referrals REFERRED TO PCP OFFICE

## 2020-01-13 NOTE — Telephone Encounter (Signed)
Per appt notes pt has appt with Glenda Chroman FNP on 01/17/20 at 2pm.

## 2020-01-13 NOTE — Telephone Encounter (Signed)
Called and spoke with patient.  She was seen last week with some back pain and was started on Aleve 1 tablet twice a day.  She reports that her back is doing better over the last couple of days.  She does have a little bit of stomach upset and is planning to stop the Aleve.  Her stomach does better when she is eating so she has been trying to eat more regularly.  She has noticed that she has had a little bit of tingling in her feet and ankles.  This is better at night.  She reports that there is no pain in her feet or ankles and she does not have any weakness or falls.  She has a follow-up scheduled for November 5.  She was instructed to notify the office if symptoms worsen or change.

## 2020-01-15 ENCOUNTER — Other Ambulatory Visit: Payer: Self-pay | Admitting: Family Medicine

## 2020-01-15 DIAGNOSIS — K219 Gastro-esophageal reflux disease without esophagitis: Secondary | ICD-10-CM

## 2020-01-17 ENCOUNTER — Other Ambulatory Visit: Payer: Self-pay

## 2020-01-17 ENCOUNTER — Ambulatory Visit (INDEPENDENT_AMBULATORY_CARE_PROVIDER_SITE_OTHER): Payer: PPO | Admitting: Family Medicine

## 2020-01-17 ENCOUNTER — Encounter: Payer: Self-pay | Admitting: Family Medicine

## 2020-01-17 VITALS — BP 116/64 | HR 88 | Temp 97.0°F | Ht 61.5 in | Wt 96.8 lb

## 2020-01-17 DIAGNOSIS — M545 Low back pain, unspecified: Secondary | ICD-10-CM

## 2020-01-17 DIAGNOSIS — R200 Anesthesia of skin: Secondary | ICD-10-CM | POA: Diagnosis not present

## 2020-01-17 NOTE — Progress Notes (Signed)
Subjective:    Patient ID: Kristen Caldwell, female    DOB: 06/14/1945, 74 y.o.   MRN: [id number]  HPI Chief Complaint  Patient presents with  . Foot Pain    (bilateral)tingling started Wedn11/05/2019  Stopped taking aleve  . Back Pain   This is a 74 yo female who presents today with above cc. Some pain in back and numbness and tingling in right foot. She noticed back pain after bending over to passenger floor board in her car. Using TENS machine which feels good. Saw Dr. Einar Pheasant 01/07/20. Took Alleve short term, felt nauseous and "swimmy headed." No blood or dark bowel movements. Felt better after stopping Aleve with full resolution of nausea and lightheadedness. She had taken Aleve in past without problems. Numbness and tingling of foot has improved, back pain is much better.  Is interested in PT. Her friend has recommended someone.   Review of Systems Per HPI    Objective:   Physical Exam Vitals reviewed.  Constitutional:      General: She is not in acute distress.    Appearance: Normal appearance. She is not ill-appearing, toxic-appearing or diaphoretic.     Comments: Thin  HENT:     Head: Normocephalic and atraumatic.  Eyes:     Conjunctiva/sclera: Conjunctivae normal.  Cardiovascular:     Rate and Rhythm: Normal rate.     Pulses: Normal pulses.  Pulmonary:     Effort: Pulmonary effort is normal.  Musculoskeletal:     Cervical back: Normal range of motion and neck supple.     Right lower leg: No edema.     Left lower leg: No edema.     Comments: Normal gait. No tenderness to palpation of low back. Right foot with normal sensation, strong DP/PT pulses, normal color/ temperature.   Skin:    General: Skin is warm and dry.     Capillary Refill: Capillary refill takes less than 2 seconds.  Neurological:     Mental Status: She is alert and oriented to person, place, and time.  Psychiatric:        Mood and Affect: Mood normal.        Behavior: Behavior normal.          Thought Content: Thought content normal.        Judgment: Judgment normal.      BP 116/64   Pulse 88   Temp (!) 97 F (36.1 C) (Temporal)   Ht 5' 1.5" (1.562 m)   Wt 96 lb 12 oz (43.9 kg)   SpO2 98%   BMI 17.98 kg/m  Wt Readings from Last 3 Encounters:  01/17/20 96 lb 12 oz (43.9 kg)  01/07/20 96 lb 4 oz (43.7 kg)  11/11/19 94 lb 4 oz (42.8 kg)        Assessment & Plan:  1. Acute midline low back pain without sciatica - likely strain from bending/ twisting, doing better, can continue Tylenol, TENS unit, heat - she is interested in PT and I am agreeable. She will call and see if she needs a referral and if they take her insurance  2. Numbness of right foot - this is improving and exam is normal today, continue to monitor and follow up if any change or worsening  This visit occurred during the SARS-CoV-2 public health emergency.  Safety protocols were in place, including screening questions prior to the visit, additional usage of staff PPE, and extensive cleaning of exam room while observing appropriate  contact time as indicated for disinfecting solutions.      Clarene Reamer, FNP-BC  [facility] at [facility] [place] [facility]  01/19/2020 1:41 PM

## 2020-01-17 NOTE — Patient Instructions (Signed)
Good to see you today  Glad your back is doing better, continue TENS unit, heat, tylenol, topical ointments  Follow up if any worsening or change in pain

## 2020-01-20 ENCOUNTER — Encounter: Payer: Self-pay | Admitting: Family Medicine

## 2020-02-03 ENCOUNTER — Other Ambulatory Visit: Payer: Self-pay | Admitting: Family Medicine

## 2020-02-03 DIAGNOSIS — F411 Generalized anxiety disorder: Secondary | ICD-10-CM

## 2020-02-10 ENCOUNTER — Other Ambulatory Visit: Payer: Self-pay

## 2020-02-10 DIAGNOSIS — F411 Generalized anxiety disorder: Secondary | ICD-10-CM

## 2020-02-10 NOTE — Telephone Encounter (Signed)
Please call patient's pharmacy and authorize early fill of amitriptyline.

## 2020-02-10 NOTE — Telephone Encounter (Signed)
Completed.

## 2020-02-10 NOTE — Telephone Encounter (Signed)
Pt left vm at triage requesting refill for amitriptyline be sent to [facility].  According to pt's chart last rx was sent 02/03/20, #90/1 to [facility].  I informed pt.  States [facility] told her ins co will not allow refill until 02/16/20.  Pt states she does not know how but does not have enough pills to last.  Requesting rx for enough tablets until then.  Pt can be reached at [phone].

## 2020-02-17 ENCOUNTER — Encounter: Payer: Self-pay | Admitting: Family Medicine

## 2020-02-17 ENCOUNTER — Telehealth (INDEPENDENT_AMBULATORY_CARE_PROVIDER_SITE_OTHER): Payer: PPO | Admitting: Family Medicine

## 2020-02-17 DIAGNOSIS — J069 Acute upper respiratory infection, unspecified: Secondary | ICD-10-CM | POA: Diagnosis not present

## 2020-02-17 NOTE — Progress Notes (Signed)
Virtual Visit via Video Note  I connected with Kristen Caldwell on 02/17/20 at 11:45 AM EST by a video enabled telemedicine application and verified that I am speaking with the correct person using two identifiers.  Location: Patient: At her workplace Provider: [place] Persons participating in virtual visit: Patient, provider    I discussed the limitations of evaluation and management by telemedicine and the availability of in person appointments. The patient expressed understanding and agreed to proceed.  History of Present Illness: This is a 74 year old female presents today with pain in her sinuses, close Covid contact. She  has a past medical history of Anxiety, Chronic headaches, Depression, Diverticulosis, GERD (gastroesophageal reflux disease), and Osteoporosis. Patient's son with whom she lives was diagnosed with Covid last week.  Patient has had 2 - Covid test, the last one being yesterday.  She reports some pain under her eyes, nonproductive cough, mild headache and neck ache for 3 days.  She reports that she felt a little nauseated this morning but this was relieved with eating crackers.  She feels "all right."  She denies chest pain, shortness of breath, wheeze.  She has had some left ear pressure off and on with symptoms.  She restarted her Flonase 2 days ago and has been taking Robitussin-DM twice a day and daily cetirizine.  She has been checking her temperature daily and has not run a fever.  Her son has been isolating and wearing a mask in the house.  Observations/Objective: Patient is alert and answers questions appropriately.  Visible skin is unremarkable.  Respirations are even and unlabored with no audible wheeze.  Near the end of our virtual visit she did have several episodes of dry cough.  No increased work of breathing with normal conversation.  Mood and affect are appropriate.  Assessment and Plan: 1. Viral URI with cough -Suspect viral etiology  with headache, backaches.  Reassuring that she has had 2 - Covid test.  She is fully vaccinated. -Continue symptomatic treatment with Flonase nasal spray, cetirizine, Robitussin-DM, good fluid intake -Follow-up if worsening of symptoms or if no improvement in 3 to 5 days   Clarene Reamer, FNP-BC  [facility] at [facility] [place]  02/17/2020 12:14 PM   Follow Up Instructions:    I discussed the assessment and treatment plan with the patient. The patient was provided an opportunity to ask questions and all were answered. The patient agreed with the plan and demonstrated an understanding of the instructions.   The patient was advised to call back or seek an in-person evaluation if the symptoms worsen or if the condition fails to improve as anticipated.   Elby Beck, FNP

## 2020-02-19 ENCOUNTER — Telehealth: Payer: Self-pay | Admitting: Family Medicine

## 2020-02-19 NOTE — Telephone Encounter (Signed)
Pt called in she wanted to let Jackelyn Poling know that she is not feeling any better and its hurting in her check bone, and head, and her ears hurt  and wanted to know if she can get something else.

## 2020-02-20 NOTE — Telephone Encounter (Signed)
Pt called again checking on status. Please advise.

## 2020-02-20 NOTE — Telephone Encounter (Signed)
Called and spoke to patient regarding her symptoms. She has not been having any fever, little dry cough, little drainage, pressure under left eye. Taking acetaminophen, Robitussin DM, essential oils, fluticasone nasal spray. Will have her add Mucincex, afrin and let me know if not improved in 3-4 days, sooner if any worsening.

## 2020-02-24 ENCOUNTER — Telehealth: Payer: Self-pay | Admitting: Family Medicine

## 2020-02-24 ENCOUNTER — Other Ambulatory Visit (HOSPITAL_COMMUNITY): Payer: Self-pay | Admitting: Family

## 2020-02-24 DIAGNOSIS — U071 COVID-19: Secondary | ICD-10-CM

## 2020-02-24 NOTE — Telephone Encounter (Signed)
Called and spoke with patient.  Her initial symptoms started around February 15, 2020.  She had a negative Covid test on February 16, 2020.  She had a positive Covid test on December 11.  She has had some headache and morning nausea with decreased sense of taste, no decreased sense of smell.  She has had 2 COVID-19 vaccines but has not had her booster yet.  She is not having any shortness of breath, fever, wheeze.  She had a virtual visit last week and reports that she is feeling better symptom wise.  She is interested in monoclonal antibody infusion.  Her name was sent to be added to queue.  Follow-up/ER precautions reviewed with patient.

## 2020-02-24 NOTE — Telephone Encounter (Signed)
Patient called in stating that she is not feeling much better. She took a covid test on Saturday. It is positive. Patient is wanting to discuss what she needs to do now. EM

## 2020-02-24 NOTE — Progress Notes (Signed)
I connected by phone with [name] on 02/24/2020 at 5:01 PM to discuss the potential use of a new treatment for mild to moderate COVID-19 viral infection in non-hospitalized patients.  This patient is a 74 y.o. female that meets the FDA criteria for Emergency Use Authorization of COVID monoclonal antibody casirivimab/imdevimab, bamlanivimab/eteseviamb, or sotrovimab.  Has a (+) direct SARS-CoV-2 viral test result  Has mild or moderate COVID-19   Is NOT hospitalized due to COVID-19  Is within 10 days of symptom onset  Has at least one of the high risk factor(s) for progression to severe COVID-19 and/or hospitalization as defined in EUA.  Specific high risk criteria : Older age (>/= 73 yo) and Chronic Kidney Disease (CKD)   Symptoms of H/A, sinus pain, no taste/smell, nausea and cough began 02/17/20.   I have spoken and communicated the following to the patient or parent/caregiver regarding COVID monoclonal antibody treatment:  1. FDA has authorized the emergency use for the treatment of mild to moderate COVID-19 in adults and pediatric patients with positive results of direct SARS-CoV-2 viral testing who are 70 years of age and older weighing at least 40 kg, and who are at high risk for progressing to severe COVID-19 and/or hospitalization.  2. The significant known and potential risks and benefits of COVID monoclonal antibody, and the extent to which such potential risks and benefits are unknown.  3. Information on available alternative treatments and the risks and benefits of those alternatives, including clinical trials.  4. Patients treated with COVID monoclonal antibody should continue to self-isolate and use infection control measures (e.g., wear mask, isolate, social distance, avoid sharing personal items, clean and disinfect "high touch" surfaces, and frequent handwashing) according to CDC guidelines.   5. The patient or parent/caregiver has the option to accept  or refuse COVID monoclonal antibody treatment.  After reviewing this information with the patient, the patient has agreed to receive one of the available covid 19 monoclonal antibodies and will be provided an appropriate fact sheet prior to infusion. Asencion Gowda, NP 02/24/2020 5:01 PM

## 2020-02-25 ENCOUNTER — Other Ambulatory Visit: Payer: Self-pay | Admitting: Nurse Practitioner

## 2020-02-25 ENCOUNTER — Telehealth: Payer: Self-pay | Admitting: Family Medicine

## 2020-02-25 ENCOUNTER — Ambulatory Visit (HOSPITAL_COMMUNITY)
Admission: RE | Admit: 2020-02-25 | Discharge: 2020-02-25 | Disposition: A | Discharge status: Home / Self Care | Payer: Medicare Other | Source: Ambulatory Visit | Attending: Pulmonary Disease | Admitting: Pulmonary Disease

## 2020-02-25 ENCOUNTER — Other Ambulatory Visit: Payer: Self-pay | Admitting: Family Medicine

## 2020-02-25 DIAGNOSIS — R110 Nausea: Secondary | ICD-10-CM

## 2020-02-25 DIAGNOSIS — J0100 Acute maxillary sinusitis, unspecified: Secondary | ICD-10-CM

## 2020-02-25 DIAGNOSIS — Z23 Encounter for immunization: Secondary | ICD-10-CM | POA: Diagnosis not present

## 2020-02-25 DIAGNOSIS — U071 COVID-19: Secondary | ICD-10-CM

## 2020-02-25 DIAGNOSIS — R638 Other symptoms and signs concerning food and fluid intake: Secondary | ICD-10-CM

## 2020-02-25 MED ORDER — ONDANSETRON HCL 8 MG PO TABS
8.0000 mg | ORAL_TABLET | Freq: Three times a day (TID) | ORAL | 2 refills | Status: DC | PRN
Start: 2020-02-25 — End: 2021-06-14

## 2020-02-25 MED ORDER — AZITHROMYCIN 250 MG PO TABS: ORAL_TABLET | ORAL | 0 refills | Status: DC

## 2020-02-25 MED ORDER — SODIUM CHLORIDE 0.9 % IV SOLN: INTRAVENOUS | Status: DC | PRN

## 2020-02-25 MED ORDER — FAMOTIDINE IN NACL 20-0.9 MG/50ML-% IV SOLN: 20.0000 mg | Freq: Once | INTRAVENOUS | Status: DC | PRN

## 2020-02-25 MED ORDER — SODIUM CHLORIDE 0.9 % IV SOLN: Freq: Once | INTRAVENOUS | Status: AC

## 2020-02-25 MED ORDER — ONDANSETRON HCL 4 MG/2ML IJ SOLN
4.0000 mg | Freq: Once | INTRAMUSCULAR | Status: AC
  Administered 2020-02-25: 14:00:00 4 mg via INTRAVENOUS
  Filled 2020-02-25: qty 2

## 2020-02-25 MED ORDER — METHYLPREDNISOLONE SODIUM SUCC 125 MG IJ SOLR: 125.0000 mg | Freq: Once | INTRAMUSCULAR | Status: DC | PRN

## 2020-02-25 MED ORDER — DIPHENHYDRAMINE HCL 50 MG/ML IJ SOLN: 50.0000 mg | Freq: Once | INTRAMUSCULAR | Status: DC | PRN

## 2020-02-25 MED ORDER — ALBUTEROL SULFATE HFA 108 (90 BASE) MCG/ACT IN AERS: 2.0000 | INHALATION_SPRAY | Freq: Once | RESPIRATORY_TRACT | Status: DC | PRN

## 2020-02-25 MED ORDER — SODIUM CHLORIDE 0.9 % IV BOLUS
1000.0000 mL | Freq: Once | INTRAVENOUS | Status: AC
  Administered 2020-02-25: 1000 mL via INTRAVENOUS

## 2020-02-25 MED ORDER — EPINEPHRINE 0.3 MG/0.3ML IJ SOAJ: 0.3000 mg | Freq: Once | INTRAMUSCULAR | Status: DC | PRN

## 2020-02-25 NOTE — Discharge Instructions (Signed)
10 Things You Can Do to Manage Your COVID-19 Symptoms at Home If you have possible or confirmed COVID-19: 1. Stay home from work and school. And stay away from other public places. If you must go out, avoid using any kind of public transportation, ridesharing, or taxis. 2. Monitor your symptoms carefully. If your symptoms get worse, call your healthcare provider immediately. 3. Get rest and stay hydrated. 4. If you have a medical appointment, call the healthcare provider ahead of time and tell them that you have or may have COVID-19. 5. For medical emergencies, call 911 and notify the dispatch personnel that you have or may have COVID-19. 6. Cover your cough and sneezes with a tissue or use the inside of your elbow. 7. Wash your hands often with soap and water for at least 20 seconds or clean your hands with an alcohol-based hand sanitizer that contains at least 60% alcohol. 8. As much as possible, stay in a specific room and away from other people in your home. Also, you should use a separate bathroom, if available. If you need to be around other people in or outside of the home, wear a mask. 9. Avoid sharing personal items with other people in your household, like dishes, towels, and bedding. 10. Clean all surfaces that are touched often, like counters, tabletops, and doorknobs. Use household cleaning sprays or wipes according to the label instructions. cdc.gov/coronavirus 09/12/2018 This information is not intended to replace advice given to you by your health care provider. Make sure you discuss any questions you have with your health care provider. Document Revised: 02/14/2019 Document Reviewed: 02/14/2019 Elsevier Patient Education  2020 Elsevier Inc. What types of side effects do monoclonal antibody drugs cause?  Common side effects  In general, the more common side effects caused by monoclonal antibody drugs include: . Allergic reactions, such as hives or itching . Flu-like signs and  symptoms, including chills, fatigue, fever, and muscle aches and pains . Nausea, vomiting . Diarrhea . Skin rashes . Low blood pressure   The CDC is recommending patients who receive monoclonal antibody treatments wait at least 90 days before being vaccinated.  Currently, there are no data on the safety and efficacy of mRNA COVID-19 vaccines in persons who received monoclonal antibodies or convalescent plasma as part of COVID-19 treatment. Based on the estimated half-life of such therapies as well as evidence suggesting that reinfection is uncommon in the 90 days after initial infection, vaccination should be deferred for at least 90 days, as a precautionary measure until additional information becomes available, to avoid interference of the antibody treatment with vaccine-induced immune responses. If you have any questions or concerns after the infusion please call the Advanced Practice Provider on call at [phone]. This number is ONLY intended for your use regarding questions or concerns about the infusion post-treatment side-effects.  Please do not provide this number to others for use. For return to work notes please contact your primary care provider.   If someone you know is interested in receiving treatment please have them call the COVID hotline at [phone].   

## 2020-02-25 NOTE — Telephone Encounter (Signed)
Pt reports that she is currently getting the antibody infusion and fluids and they added zofran because she was very nauseous. Advised pt of abx and provider msg. Advised if anything changed to contact office. Gave ER precautions. Pt verbalized understanding.

## 2020-02-25 NOTE — Telephone Encounter (Signed)
Please call patient and let her know that I have sent in an antibiotic to her pharmacy. It will only provide relief if her symptoms are from a bacterial infection. It will not help the symptoms caused by the Covid virus. Please ask her if she heard from the monoclonal antibody team?

## 2020-02-25 NOTE — Progress Notes (Signed)
Patient reviewed Fact Sheet for Patients, Parents, and Caregivers for Emergency Use Authorization (EUA) of bamlanivimab/etesevimab for the Treatment of Coronavirus.  Patient also reviewed and is agreeable to the estimated cost of treatment.  Patient is agreeable to proceed.  

## 2020-02-25 NOTE — Progress Notes (Signed)
  Diagnosis: COVID-19  Physician: Dr. Joya Gaskins  Procedure: Covid Infusion Clinic Med: bamlanivimab\etesevimab infusion - Provided patient with bamlanimivab\etesevimab fact sheet for patients, parents and caregivers prior to infusion.  Complications: No immediate complications noted.  Discharge: Discharged home   Kristen Caldwell 02/25/2020

## 2020-02-25 NOTE — Progress Notes (Signed)
Patient at mAb infusion center at [facility]. Experiencing dehydration and nausea. 535mL 0.9% NS bolus provided and PRN zofran prescription sent to outpatient pharmacy

## 2020-02-25 NOTE — Telephone Encounter (Signed)
Patient called back stating that she believes that she would like you to send her in something for her sinuses. Please advise. EM  Please send to [facility] on [address]

## 2020-03-02 ENCOUNTER — Encounter: Payer: Self-pay | Admitting: Family Medicine

## 2020-03-02 ENCOUNTER — Other Ambulatory Visit: Payer: Self-pay

## 2020-03-02 ENCOUNTER — Telehealth: Payer: Self-pay

## 2020-03-02 ENCOUNTER — Telehealth (INDEPENDENT_AMBULATORY_CARE_PROVIDER_SITE_OTHER): Payer: PPO | Admitting: Family Medicine

## 2020-03-02 VITALS — Temp 98.6°F | Ht 61.5 in | Wt 95.0 lb

## 2020-03-02 DIAGNOSIS — R110 Nausea: Secondary | ICD-10-CM | POA: Diagnosis not present

## 2020-03-02 DIAGNOSIS — U071 COVID-19: Secondary | ICD-10-CM | POA: Diagnosis not present

## 2020-03-02 NOTE — Progress Notes (Signed)
Virtual Visit via Video Note  I connected with Kristen Caldwell on 03/02/20 at  3:30 PM EST by a video enabled telemedicine application and verified that I am speaking with the correct person using two identifiers.  Location: Patient: In her home Provider: [place]   I discussed the limitations of evaluation and management by telemedicine and the availability of in person appointments. The patient expressed understanding and agreed to proceed.  History of Present Illness: Chief Complaint  Patient presents with  . COVID    Started feeling bad again since yesterday--positive covid on 02/22/2020  . Nausea  . Fatigue  . Headache   This is a 74 yo female who presents today for virtual visit for above cc. She  has a past medical history of Anxiety, Chronic headaches, Depression, Diverticulosis, GERD (gastroesophageal reflux disease), and Osteoporosis. She was diagnosed with Covid 19 on Dec. 11, 2021,  she had MAB infusion 02/25/2020. Tolerated infusion. Prior to Covid diagnosis, was treated with antibiotic for sinus infection. Reports resolution of cheek pain/ pressure. She is still feeling tired and weak. She has had improvement of loss of taste and smell over last day. She has not had a cough, fever, SOB or wheeze. She has been taking Tylenol for occasional headache. She has had intermittent nausea, this is improved with eating. She has ondansetron at home, but has not been taking because symptoms are very intermittent. She has increased her fluid intake. She was out and about yesterday and is very tired today. She expected to feel better since she had MAB infusion.    Observations/Objective: Patient is alert and answers questions appropriately. Visible skin is unremarkable. She looks quite bright and is very animated. Improved appearance from when I saw her for sinusitis. She does not sound congested. No audible wheeze, cough. Respirations even and unlabored. Mood and affect  appropriate.  Temp 98.6 F (37 C) (Temporal)   Ht 5' 1.5" (1.562 m)   Wt 95 lb (43.1 kg)   BMI 17.66 kg/m  BP Readings from Last 3 Encounters:  02/25/20 139/82  01/17/20 116/64  01/07/20 128/90    Assessment and Plan: 1. COVID - overall is doing well symptom wise, discussed fatigue, expectations of course of illness. Encouraged her to space out activity, plan rest periods throughout the day - follow up if fever, SOB, wheeze, cough, severe pain  2. Nausea without vomiting - discussed using ondansetron, may cause constipation, good fluid intake, small, frequent meals - follow up if no improvement    Clarene Reamer, FNP-BC  [facility]  03/03/2020 7:09 AM   Follow Up Instructions:    I discussed the assessment and treatment plan with the patient. The patient was provided an opportunity to ask questions and all were answered. The patient agreed with the plan and demonstrated an understanding of the instructions.   The patient was advised to call back or seek an in-person evaluation if the symptoms worsen or if the condition fails to improve as anticipated.    Elby Beck, FNP

## 2020-03-02 NOTE — Telephone Encounter (Signed)
I spoke with Kristen Caldwell; Kristen Caldwell had the infusion on 02/25/20 Kristen Caldwell thought she would feel better by now after taking infusion. Kristen Caldwell said she is nauseated but Kristen Caldwell is able to eat and drink well. Kristen Caldwell said she is very tired and thought that would be better; Kristen Caldwell has slight H/A but not severe. Kristen Caldwell scheduled video visit today at 3:30 with Glenda Chroman FNP. UC & ED precautions given and Kristen Caldwell voiced understanding.

## 2020-03-02 NOTE — Telephone Encounter (Signed)
[place] [facility] - Client TELEPHONE ADVICE RECORD [facility] Patient Name: Kristen Caldwell Gender: Female DOB: May 04, 1945 Age: 74 Y 11 M 10 D Return Phone Number: [phone] (Primary) [place] [address] Client [facility] - Client Client Site [place] - Day Physician Tor Netters- NP Contact Type Call Who Is Calling Patient / Member / Family / Caregiver Call Type Triage / Clinical Relationship To Patient Self Return Phone Number [phone] (Primary) Chief Complaint Headache Reason for Call Symptomatic / Request for [place] states she had a COVID infusion and feels worse. Sx of nausea, headache and not feeling well. Translation No Nurse Assessment Nurse: Chestine Spore, RN, [place] Date/Time (Eastern Time): 03/02/2020 10:00:49 AM Confirm and document reason for call. If symptomatic, describe symptoms. ---caller states got antibody infusion few days ago. got IV on day 9 after first s/s of covid-19. felt better at first but not as well as she thought she would Does the patient have any new or worsening symptoms? ---Yes Will a triage be completed? ---Yes Related visit to physician within the last 2 weeks? ---Yes Does the PT have any chronic conditions? (i.e. diabetes, asthma, this includes High risk factors for pregnancy, etc.) ---Yes List chronic conditions. ---kidney condition Is this a behavioral health or substance abuse call? ---No Guidelines Guideline Title Affirmed Question Affirmed Notes Nurse Date/Time (Eastern Time) COVID-19 - Diagnosed or Suspected [1] COVID-19 diagnosed by positive lab test (e.g., PCR, rapid selftest kit) AND [2] mild symptoms (e.g., cough, fever, others) AND [7] no complications or SOB Matherly, RN, [place] 03/02/2020 10:09:08 AM Disp. Time [name] Time) Disposition Final User 03/02/2020 10:18:06 AM  Home Care Yes Matherly, RN, [place] PLEASE NOTE: All timestamps contained within this report are represented as [place] Standard Time. CONFIDENTIALTY NOTICE: This fax transmission is intended only for the addressee. It contains information that is legally privileged, confidential or otherwise protected from use or disclosure. If you are not the intended recipient, you are strictly prohibited from reviewing, disclosing, copying using or disseminating any of this information or taking any action in reliance on or regarding this information. If you have received this fax in error, please notify us immediately by telephone so that we can arrange for its return to [place]. Phone: [phone], Toll-Free: [phone], Fax: [phone] Page: 2 of 2 Call Id: [id number] [place] Disagree/Comply Comply Caller Understands Yes PreDisposition Call Doctor Care Advice Given Per Guideline HOME CARE: * You should be able to treat this at home. GENERAL CARE ADVICE FOR COVID-19 SYMPTOMS: * The treatment is the same whether you have COVID-19, influenza or some other respiratory virus. * Cough: Use cough drops. * Feeling dehydrated: Drink extra liquids. If the air in your home is dry, use a humidifier. * Fever: For fever over 101 F (38.3 C), take acetaminophen every 4 to 6 hours (Adults 650 mg) OR ibuprofen every 6 to 8 hours (Adults 400 mg). Before taking any medicine, read all the instructions on the package. Do not take aspirin unless your doctor has prescribed it for you. * Muscle aches, headache, and other pains: Often this comes and goes with the fever. Take acetaminophen every 4 to 6 hours (Adults 650 mg) OR ibuprofen every 6 to 8 hours (Adults 400 mg). Before taking any medicine, read all the instructions on the package. PAIN AND FEVER MEDICINES: * For pain or fever relief, take either acetaminophen or ibuprofen. CALL BACK IF: * Fever over 103 F (  39.4 C) * Fever lasts over 3 days * Fever returns after being gone  for 24 hours * Chest pain or difficulty breathing occurs * You become worse CARE ADVICE given per COVID-19 - DIAGNOSED OR SUSPECTED (Adult) guideline.

## 2020-03-03 ENCOUNTER — Encounter: Payer: Self-pay | Admitting: Family Medicine

## 2020-03-03 DIAGNOSIS — U071 COVID-19: Secondary | ICD-10-CM | POA: Insufficient documentation

## 2020-03-03 HISTORY — DX: COVID-19: U07.1

## 2020-04-05 ENCOUNTER — Encounter: Payer: Self-pay | Admitting: Family Medicine

## 2020-04-05 DIAGNOSIS — G8929 Other chronic pain: Secondary | ICD-10-CM

## 2020-04-07 MED ORDER — TOPIRAMATE 25 MG PO TABS
ORAL_TABLET | ORAL | 2 refills | Status: DC
Start: 2020-04-07 — End: 2020-11-12

## 2020-04-07 NOTE — Telephone Encounter (Signed)
Pt called to check status of med, since it should have 3 additional refills on file. 1st I scheduled an TOC appt with Dr. Einar Pheasant. Next I called [facility] to check status of refill of med since it was filled with a years worth of refills, pharmacy said that Rx was cancelled on their end (in error) so they can't refill it again. They have no idea why someone on their end cancelled her Rx. New Rx sent to pharmacy and pt aware

## 2020-04-20 ENCOUNTER — Ambulatory Visit (INDEPENDENT_AMBULATORY_CARE_PROVIDER_SITE_OTHER): Payer: PPO | Admitting: Family Medicine

## 2020-04-20 ENCOUNTER — Other Ambulatory Visit: Payer: Self-pay

## 2020-04-20 ENCOUNTER — Encounter: Payer: Self-pay | Admitting: Family Medicine

## 2020-04-20 VITALS — BP 110/60 | HR 83 | Temp 97.2°F | Ht 62.0 in | Wt 95.5 lb

## 2020-04-20 DIAGNOSIS — K219 Gastro-esophageal reflux disease without esophagitis: Secondary | ICD-10-CM | POA: Diagnosis not present

## 2020-04-20 DIAGNOSIS — Z79890 Hormone replacement therapy (postmenopausal): Secondary | ICD-10-CM

## 2020-04-20 DIAGNOSIS — M810 Age-related osteoporosis without current pathological fracture: Secondary | ICD-10-CM

## 2020-04-20 NOTE — Assessment & Plan Note (Addendum)
Unable to tolerate lower doses in the past. This was > 6 years ago. Discussed that the risk of MI/stroke increases with age and with estrogen use, though unclear if estrogen is helping with bone health. Encouraged endocrine consult to help guide osteoporosis treatment and if any benefit is coming from estrogen. Discussed that if no bone benefit would recommend stopping and can do slow titration if needed.

## 2020-04-20 NOTE — Assessment & Plan Note (Signed)
Pt with burping as main concern but treated with omeprazole. Discussed that omeprazole can worsen bone health. She failed attempts to stop in the past. She will try either famotidine or essential oil treatment per preference. GI referral if symptoms persist to better understand cause and safety/importance of longterm PPI

## 2020-04-20 NOTE — Progress Notes (Signed)
Subjective:     Kristen Caldwell is a 75 y.o. female presenting for Transitions Of Care     HPI  #GERD - has tried essential oil pills - also taking omeprazole  #osteoporosis - failed fosamax and evista - taking vit d  #estrogen - tried stopping 6-10 years ago and got "sick" - went to GYN and restarted with improvement   Review of Systems   Social History   Tobacco Use  Smoking Status Never Smoker  Smokeless Tobacco Never Used        Objective:    BP Readings from Last 3 Encounters:  04/20/20 110/60  02/25/20 139/82  01/17/20 116/64   Wt Readings from Last 3 Encounters:  04/20/20 95 lb 8 oz (43.3 kg)  03/02/20 95 lb (43.1 kg)  01/17/20 96 lb 12 oz (43.9 kg)    BP 110/60   Pulse 83   Temp (!) 97.2 F (36.2 C) (Temporal)   Ht 5\' 2"  (1.575 m)   Wt 95 lb 8 oz (43.3 kg)   SpO2 97%   BMI 17.47 kg/m    Physical Exam Constitutional:      General: She is not in acute distress.    Appearance: She is well-developed. She is not diaphoretic.  HENT:     Right Ear: External ear normal.     Left Ear: External ear normal.     Nose: Nose normal.  Eyes:     Conjunctiva/sclera: Conjunctivae normal.  Cardiovascular:     Rate and Rhythm: Normal rate.  Pulmonary:     Effort: Pulmonary effort is normal.  Musculoskeletal:     Cervical back: Neck supple.  Skin:    General: Skin is warm and dry.     Capillary Refill: Capillary refill takes less than 2 seconds.  Neurological:     Mental Status: She is alert. Mental status is at baseline.  Psychiatric:        Mood and Affect: Mood normal.        Behavior: Behavior normal.           Assessment & Plan:   Problem List Items Addressed This Visit      Digestive   GERD - Primary    Pt with burping as main concern but treated with omeprazole. Discussed that omeprazole can worsen bone health. She failed attempts to stop in the past. She will try either famotidine or essential oil treatment per  preference. GI referral if symptoms persist to better understand cause and safety/importance of longterm PPI      Relevant Orders   Ambulatory referral to Endocrinology     Musculoskeletal and Integument   Osteoporosis    Intolerant of several pills - nausea and insomnia (after injection). Encouraged endocrine consult to help determine if other treatment options are available and to consider benefit of current estrogen replacement.         Other   Postmenopausal HRT (hormone replacement therapy)    Unable to tolerate lower doses in the past. This was > 6 years ago. Discussed that the risk of MI/stroke increases with age and with estrogen use, though unclear if estrogen is helping with bone health. Encouraged endocrine consult to help guide osteoporosis treatment and if any benefit is coming from estrogen. Discussed that if no bone benefit would recommend stopping and can do slow titration if needed.       Relevant Orders   Ambulatory referral to Endocrinology       Return  in about 7 months (around 11/18/2020).  [name], MD  This visit occurred during the SARS-CoV-2 public health emergency.  Safety protocols were in place, including screening questions prior to the visit, additional usage of staff PPE, and extensive cleaning of exam room while observing appropriate contact time as indicated for disinfecting solutions.

## 2020-04-20 NOTE — Patient Instructions (Addendum)
#Reflux/Heartburn/Burping  - try stopping omeprazole - avoid food triggers below - try essential oils - if worsening symptoms, can either try Famotidine or restart Omeprazole -- but also consider Gastroenterology again  - Send MyChart in 2 months to update   #Bone Health and Estrogen - referral to Endocrinology to help guide treatment plan     Heartburn Heartburn is a type of pain or discomfort that can happen in your throat or chest. It is often described as a burning pain. It may also cause a bad, acid-like taste in your mouth. It may be caused by stomach contents that move back up (reflux) into the part of the body that moves food from your mouth to your stomach (esophagus). Heartburn may feel worse:  When you lie down.  When you bend over.  At night. Follow these instructions at home: Eating and drinking  Avoid certain foods and drinks as told by your doctor. This may include: ? Coffee and tea, with or without caffeine. ? Drinks that have alcohol. ? Energy drinks and sports drinks. ? Carbonated drinks or sodas. ? Chocolate and cocoa. ? Peppermint and mint flavorings. ? Garlic and onions. ? Horseradish. ? Spicy and acidic foods, such as:  Peppers.  Chili powder and curry powder.  Vinegar.  Hot sauces and BBQ sauce. ? Citrus fruit juices and citrus fruits, such as:  Oranges.  Lemons.  Limes. ? Tomato-based foods, such as:  Red sauce and pizza with red sauce.  Chili.  Salsa. ? Fried and fatty foods, such as:  Donuts.  [place] fries and potato chips.  High-fat dressings. ? High-fat meats, such as:  Hot dogs and sausage.  Rib eye steak.  Ham and bacon. ? High-fat dairy items, such as:  Whole milk.  Butter.  Cream cheese.  Eat small meals often. Avoid eating large meals.  Avoid drinking large amounts of liquid with your meals.  Avoid eating meals during the 2-3 hours before bedtime.  Avoid lying down right after you eat.  Do not  exercise right after you eat.   Lifestyle  If you are overweight, lose an amount of weight that is healthy for you. Ask your doctor about a safe weight loss goal.  Do not smoke or use any products that contain nicotine or tobacco. These can make your symptoms worse. If you need help quitting, ask your doctor.  Wear loose clothes. Do not wear anything tight around your waist.  Raise (elevate) the head of your bed about 6 inches (15 cm) when you sleep. You can use a wedge to do this.  Try to lower your stress. If you need help doing this, ask your doctor.      Medicines  Take over-the-counter and prescription medicines only as told by your doctor.  Do not take aspirin or NSAIDs, such as ibuprofen, unless your doctor says it is okay.  Stop medicines only as told by your doctor. If you stop taking some medicines too quickly, your symptoms may get worse. General instructions  Watch for any changes in your symptoms.  Keep all follow-up visits. Contact a doctor if:  You have new symptoms.  You lose weight and you do not know why.  You have trouble swallowing, or it hurts to swallow.  You have wheezing or a cough that keeps happening.  Your symptoms do not get better with treatment.  You have heartburn often for more than 2 weeks. Get help right away if:  You have pain in your arms,  neck, jaw, teeth, or back all of a sudden.  You feel sweaty, dizzy, or light-headed all of a sudden.  You have chest pain or shortness of breath.  You vomit and your vomit looks like blood or coffee grounds.  Your poop (stool) is bloody or black. These symptoms may be an emergency. Get help right away. Call your local emergency services (911 in the [place]  Do not wait to see if the symptoms will go away.  Do not drive yourself to the hospital. Summary  Heartburn is a type of pain that can happen in your throat or chest. It can feel like a burning pain. It may also cause a bad, acid-like  taste in your mouth.  You may need to avoid certain foods and drinks to help your symptoms. Ask your doctor what foods and drinks you should avoid.  Take over-the-counter and prescription medicines only as told by your doctor. Do not take aspirin or NSAIDs, such as ibuprofen, unless your doctor told you to do so.  Contact your doctor if your symptoms do not get better or they get worse. This information is not intended to replace advice given to you by your health care provider. Make sure you discuss any questions you have with your health care provider. Document Revised: 09/04/2019 Document Reviewed: 09/04/2019 Elsevier Patient Education  [place].

## 2020-04-20 NOTE — Assessment & Plan Note (Addendum)
Intolerant of several pills - nausea and insomnia (after injection). Encouraged endocrine consult to help determine if other treatment options are available and to consider benefit of current estrogen replacement.

## 2020-05-11 ENCOUNTER — Other Ambulatory Visit: Payer: Self-pay

## 2020-05-11 DIAGNOSIS — F411 Generalized anxiety disorder: Secondary | ICD-10-CM

## 2020-05-11 NOTE — Telephone Encounter (Signed)
Pharmacy requests refill on: Clorazepate 3.75 mg   LAST REFILL: 11/11/2019 (Q-90, R-1) LAST OV: 04/20/2020 NEXT OV: 05/12/2020 PHARMACY: [place], [place]

## 2020-05-12 ENCOUNTER — Other Ambulatory Visit: Payer: Self-pay

## 2020-05-12 ENCOUNTER — Ambulatory Visit: Payer: PPO | Admitting: Family Medicine

## 2020-05-12 ENCOUNTER — Emergency Department
Admission: EM | Admit: 2020-05-12 | Discharge: 2020-05-12 | Disposition: A | Discharge status: Home / Self Care | Payer: PPO | Attending: Emergency Medicine | Admitting: Emergency Medicine

## 2020-05-12 DIAGNOSIS — R55 Syncope and collapse: Secondary | ICD-10-CM | POA: Insufficient documentation

## 2020-05-12 DIAGNOSIS — Z8616 Personal history of COVID-19: Secondary | ICD-10-CM | POA: Insufficient documentation

## 2020-05-12 DIAGNOSIS — I4510 Unspecified right bundle-branch block: Secondary | ICD-10-CM | POA: Diagnosis not present

## 2020-05-12 DIAGNOSIS — E860 Dehydration: Secondary | ICD-10-CM | POA: Insufficient documentation

## 2020-05-12 DIAGNOSIS — I10 Essential (primary) hypertension: Secondary | ICD-10-CM | POA: Diagnosis not present

## 2020-05-12 DIAGNOSIS — N3000 Acute cystitis without hematuria: Secondary | ICD-10-CM

## 2020-05-12 DIAGNOSIS — N183 Chronic kidney disease, stage 3 unspecified: Secondary | ICD-10-CM | POA: Diagnosis not present

## 2020-05-12 LAB — URINALYSIS, COMPLETE (UACMP) WITH MICROSCOPIC
Bilirubin Urine: NEGATIVE
Glucose, UA: NEGATIVE mg/dL
Hgb urine dipstick: NEGATIVE
Ketones, ur: 5 mg/dL — AB
Nitrite: NEGATIVE
Protein, ur: NEGATIVE mg/dL
Specific Gravity, Urine: 1.017 (ref 1.005–1.030)
pH: 5 (ref 5.0–8.0)

## 2020-05-12 LAB — COMPREHENSIVE METABOLIC PANEL
ALT: 15 U/L (ref 0–44)
AST: 20 U/L (ref 15–41)
Albumin: 3.9 g/dL (ref 3.5–5.0)
Alkaline Phosphatase: 67 U/L (ref 38–126)
Anion gap: 11 (ref 5–15)
BUN: 17 mg/dL (ref 8–23)
CO2: 19 mmol/L — ABNORMAL LOW (ref 22–32)
Calcium: 9.4 mg/dL (ref 8.9–10.3)
Chloride: 106 mmol/L (ref 98–111)
Creatinine, Ser: 0.81 mg/dL (ref 0.44–1.00)
GFR, Estimated: >60 mL/min (ref >60)
Glucose, Bld: 129 mg/dL — ABNORMAL HIGH (ref 70–99)
Potassium: 4 mmol/L (ref 3.5–5.1)
Sodium: 136 mmol/L (ref 135–145)
Total Bilirubin: 0.5 mg/dL (ref 0.3–1.2)
Total Protein: 7.3 g/dL (ref 6.5–8.1)

## 2020-05-12 LAB — CBC WITH DIFFERENTIAL/PLATELET
Abs Immature Granulocytes: 0.02 10*3/uL (ref 0.00–0.07)
Basophils Absolute: 0 10*3/uL (ref 0.0–0.1)
Basophils Relative: 1 %
Eosinophils Absolute: 0.2 10*3/uL (ref 0.0–0.5)
Eosinophils Relative: 2 %
HCT: 43.7 % (ref 36.0–46.0)
Hemoglobin: 14.7 g/dL (ref 12.0–15.0)
Immature Granulocytes: 0 %
Lymphocytes Relative: 23 %
Lymphs Abs: 1.6 10*3/uL (ref 0.7–4.0)
MCH: 30.7 pg (ref 26.0–34.0)
MCHC: 33.6 g/dL (ref 30.0–36.0)
MCV: 91.2 fL (ref 80.0–100.0)
Monocytes Absolute: 0.5 10*3/uL (ref 0.1–1.0)
Monocytes Relative: 8 %
Neutro Abs: 4.4 10*3/uL (ref 1.7–7.7)
Neutrophils Relative %: 66 %
Platelets: 180 10*3/uL (ref 150–400)
RBC: 4.79 MIL/uL (ref 3.87–5.11)
RDW: 12.7 % (ref 11.5–15.5)
WBC: 6.6 10*3/uL (ref 4.0–10.5)
nRBC: 0 % (ref 0.0–0.2)

## 2020-05-12 LAB — BRAIN NATRIURETIC PEPTIDE: B Natriuretic Peptide: 21.5 pg/mL (ref 0.0–100.0)

## 2020-05-12 LAB — CBG MONITORING, ED: Glucose-Capillary: 114 mg/dL — ABNORMAL HIGH (ref 70–99)

## 2020-05-12 LAB — TROPONIN I (HIGH SENSITIVITY)
Troponin I (High Sensitivity): 2 ng/L (ref <18)
Troponin I (High Sensitivity): 3 ng/L (ref <18)

## 2020-05-12 MED ORDER — CLORAZEPATE DIPOTASSIUM 3.75 MG PO TABS: 3.7500 mg | ORAL_TABLET | Freq: Every day | ORAL | 1 refills | Status: DC

## 2020-05-12 MED ORDER — SODIUM CHLORIDE 0.9 % IV BOLUS
1000.0000 mL | Freq: Once | INTRAVENOUS | Status: AC
  Administered 2020-05-12: 1000 mL via INTRAVENOUS

## 2020-05-12 MED ORDER — CEFDINIR 300 MG PO CAPS: 300.0000 mg | ORAL_CAPSULE | Freq: Two times a day (BID) | ORAL | 0 refills | Status: AC

## 2020-05-12 NOTE — ED Notes (Signed)
Pt alert and oriented X 4, stable for discharge. RR even and unlabored, color WNL. Discussed discharge instructions and follow-up as directed. Discharge medications discussed if prescribed. Pt had opportunity to ask questions, and RN to provide patient/family eduction. Pt states she wants to ambulate to leave, son at bedside in agreement with plan. Ambulates safely in room and down hallway with no dizziness reported.

## 2020-05-12 NOTE — ED Provider Notes (Signed)
[facility] Emergency Department Provider Note   ____________________________________________   None    (approximate)  I have reviewed the triage vital signs and the nursing notes.   HISTORY  Chief Complaint Near Syncope    HPI Kristen Caldwell is a 75 y.o. female stated past history of anxiety/depression, diverticulosis, and GERD who presents via EMS from work after an episode of presyncope.  Patient states that she has had episodes similar to this in the past when she has been dehydrated and describes, standing up at her work, feeling lightheaded, and sitting down with symptoms significantly improved.  EMS state that they attempted to have patient stand to get on the stretcher and she became orthostatic once again.  EMS state they were unable to capture any significant cardiac activity or repeat her blood pressure.  Patient states that she feels much better at this time laying down.  Patient states that the symptoms are worsened when she is sitting or standing.  Patient currently denies any vision changes, tinnitus, difficulty speaking, facial droop, sore throat, chest pain, shortness of breath, abdominal pain, nausea/vomiting/diarrhea, dysuria, or numbness/paresthesias in any extremity         Past Medical History:  Diagnosis Date  . Anxiety   . Chronic headaches   . Depression   . Diverticulosis   . GERD (gastroesophageal reflux disease)   . Osteoporosis     Patient Active Problem List   Diagnosis Date Noted  . COVID 03/03/2020  . CKD (chronic kidney disease) stage 3, GFR 30-59 ml/min (HCC) 01/07/2020  . Chronic bilateral low back pain without sciatica 01/07/2020  . Dupuytren's contracture of right hand 11/11/2019  . Ear noise/buzzing, right 11/11/2019  . Postmenopausal HRT (hormone replacement therapy) 05/23/2013  . HLD (hyperlipidemia) 06/09/2008  . Anxiety state 02/21/2007  . Depression 02/21/2007  . Migraines 02/21/2007  .  GERD 02/21/2007  . Osteoporosis 02/21/2007  . CARPAL TUNNEL SYNDROME, HX OF 02/21/2007    Past Surgical History:  Procedure Laterality Date  . ABDOMINAL HYSTERECTOMY  03/14/1989   partial  . COLONOSCOPY    . TUBAL LIGATION      Prior to Admission medications   Medication Sig Start Date End Date Taking? Authorizing Provider  cefdinir (OMNICEF) 300 MG capsule Take 1 capsule (300 mg total) by mouth 2 (two) times daily for 5 days. 05/12/20 05/17/20 Yes Naaman Plummer, MD  amitriptyline (ELAVIL) 25 MG tablet TAKE 1 TABLET BY MOUTH AT BEDTIME AS NEEDED FOR SLEEP 02/03/20   Elby Beck, FNP  Calcium Carbonate-Vit D-Min (CALTRATE 600+D PLUS) 600-400 MG-UNIT per tablet Take 2 tablets by mouth daily.    [provider]  clorazepate (TRANXENE) 3.75 MG tablet Take 1 tablet (3.75 mg total) by mouth daily. 11/11/19   Elby Beck, FNP  estradiol (ESTRACE) 0.5 MG tablet TAKE 1 TABLET(0.5 MG) BY MOUTH DAILY 12/02/19   Elby Beck, FNP  Multiple Vitamins-Minerals (CENTRUM SILVER) CHEW Chew 1 tablet by mouth daily.    [provider]  Omega-3 Fatty Acids (FISH OIL PO) Take 1 capsule by mouth 2 (two) times daily.    [provider]  omeprazole (PRILOSEC) 20 MG capsule TAKE 1 CAPSULE(20 MG) BY MOUTH DAILY 01/15/20   Elby Beck, FNP  ondansetron (ZOFRAN) 8 MG tablet Take 1 tablet (8 mg total) by mouth every 8 (eight) hours as needed for nausea or vomiting. 02/25/20   Early, Coralee Pesa, NP  topiramate (TOPAMAX) 25 MG tablet TAKE  1 TABLET(25 MG) BY MOUTH TWICE DAILY 04/07/20   [name], MD  TURMERIC PO Take by mouth.    [provider]    Allergies Amoxicillin, Cephalexin, Penicillins, Sertraline, and Promethazine hcl  Family History  Problem Relation Age of Onset  . Aneurysm Mother   . Stroke Father   . Hypertension Father   . Hypertension Brother   . Heart attack Brother   . Lupus Sister   . Diabetes Paternal Grandfather   . Breast cancer  Paternal Aunt   . Colon cancer Neg Hx     Social History Social History   Tobacco Use  . Smoking status: Never Smoker  . Smokeless tobacco: Never Used  Vaping Use  . Vaping Use: Never used  Substance Use Topics  . Alcohol use: No  . Drug use: No    Review of Systems Constitutional: No fever/chills Eyes: No visual changes. ENT: No sore throat. Cardiovascular: Denies chest pain. Respiratory: Denies shortness of breath. Gastrointestinal: No abdominal pain.  No nausea, no vomiting.  No diarrhea. Genitourinary: Negative for dysuria. Musculoskeletal: Negative for acute arthralgias Skin: Negative for rash. Neurological: Positive for presyncope.  Negative for headaches, numbness/paresthesias in any extremity Psychiatric: Negative for suicidal ideation/homicidal ideation   ____________________________________________   PHYSICAL EXAM:  VITAL SIGNS: ED Triage Vitals  Enc Vitals Group     BP --      Pulse --      Resp --      Temp --      Temp src --      SpO2 --      Weight 05/12/20 0926 95 lb (43.1 kg)     Height 05/12/20 0926 5\' 2"  (1.575 m)     Head Circumference --      Peak Flow --      Pain Score 05/12/20 0925 0     Pain Loc --      Pain Edu? --      Excl. in [place]? --    Constitutional: Alert and oriented. Well appearing cachectic Caucasian female in no acute distress. Eyes: Conjunctivae are normal. PERRL. Head: Atraumatic. Nose: No congestion/rhinnorhea. Mouth/Throat: Mucous membranes are moist. Neck: No stridor Cardiovascular: Grossly normal heart sounds.  Good peripheral circulation. Respiratory: Normal respiratory effort.  No retractions. Gastrointestinal: Soft and nontender. No distention. Musculoskeletal: No obvious deformities Neurologic:  Normal speech and language. No gross focal neurologic deficits are appreciated. Skin:  Skin is warm and dry. No rash noted. Psychiatric: Mood and affect are normal. Speech and behavior are  normal.  ____________________________________________   LABS (all labs ordered are listed, but only abnormal results are displayed)  Labs Reviewed  COMPREHENSIVE METABOLIC PANEL - Abnormal; Notable for the following components:      Result Value   CO2 19 (*)    Glucose, Bld 129 (*)    All other components within normal limits  URINALYSIS, COMPLETE (UACMP) WITH MICROSCOPIC - Abnormal; Notable for the following components:   Color, Urine YELLOW (*)    APPearance HAZY (*)    Ketones, ur 5 (*)    Leukocytes,Ua SMALL (*)    Bacteria, UA RARE (*)    All other components within normal limits  CBG MONITORING, ED - Abnormal; Notable for the following components:   Glucose-Capillary 114 (*)    All other components within normal limits  BRAIN NATRIURETIC PEPTIDE  CBC WITH DIFFERENTIAL/PLATELET  TROPONIN I (HIGH SENSITIVITY)  TROPONIN I (HIGH SENSITIVITY)   ____________________________________________  EKG  ED ECG REPORT I, Naaman Plummer, the attending physician, personally viewed and interpreted this ECG.  Date: 05/12/2020 EKG Time: 0930 Rate: 81 Rhythm: normal sinus rhythm QRS Axis: normal Intervals: normal ST/T Wave abnormalities: normal Narrative Interpretation: no evidence of acute ischemia  PROCEDURES  Procedure(s) performed (including Critical Care):  .1-3 Lead EKG Interpretation Performed by: Naaman Plummer, MD Authorized by: Naaman Plummer, MD     Interpretation: normal     ECG rate:  77   ECG rate assessment: normal     Rhythm: sinus rhythm     Ectopy: none     Conduction: normal       ____________________________________________   INITIAL IMPRESSION / ASSESSMENT AND PLAN / ED COURSE  As part of my medical decision making, I reviewed the following data within the [facility] notes reviewed and incorporated, Labs reviewed, EKG interpreted, Old chart reviewed, and Notes from prior ED visits reviewed and incorporated         Patient presents with complaints of syncope/presyncope ED Workup:  CBC, BMP, Troponin, BNP, ECG, CXR Differential diagnosis includes HF, ICH, seizure, stroke, HOCM, ACS, aortic dissection, malignant arrhythmia, or GI bleed. Findings: No evidence of acute laboratory abnormalities.  Troponin negative x1 EKG: No e/o STEMI. No evidence of Brugadas sign, delta wave, epsilon wave, significantly prolonged QTc, or malignant arrhythmia.  Disposition: Discharge. Patient is at baseline at this time. Return precautions expressed and understood in person. Advised follow up with primary care provider or clinic physician in next 24 hours.      ____________________________________________   FINAL CLINICAL IMPRESSION(S) / ED DIAGNOSES  Final diagnoses:  Near syncope  Dehydration  Acute cystitis without hematuria     ED Discharge Orders         Ordered    cefdinir (OMNICEF) 300 MG capsule  2 times daily        05/12/20 1138           Note:  This document was prepared using Dragon voice recognition software and may include unintentional dictation errors.   Naaman Plummer, MD 05/12/20 [phone]

## 2020-05-12 NOTE — ED Triage Notes (Addendum)
Near syncope at work, assisted down to ground-no injury. Near syncopal episode with EMS. CBG 95 149/84 90-100HR with RBB, orthostatic negative with EMS. 20G to LAC established PTA  No recent sickness.  EDP at bedside on arrival.

## 2020-05-20 ENCOUNTER — Ambulatory Visit (INDEPENDENT_AMBULATORY_CARE_PROVIDER_SITE_OTHER): Payer: PPO | Admitting: Primary Care

## 2020-05-20 ENCOUNTER — Telehealth: Payer: Self-pay | Admitting: Family Medicine

## 2020-05-20 ENCOUNTER — Telehealth: Payer: Self-pay | Admitting: *Deleted

## 2020-05-20 ENCOUNTER — Other Ambulatory Visit: Payer: Self-pay

## 2020-05-20 ENCOUNTER — Encounter: Payer: Self-pay | Admitting: Primary Care

## 2020-05-20 DIAGNOSIS — R55 Syncope and collapse: Secondary | ICD-10-CM

## 2020-05-20 DIAGNOSIS — R42 Dizziness and giddiness: Secondary | ICD-10-CM | POA: Diagnosis not present

## 2020-05-20 DIAGNOSIS — F411 Generalized anxiety disorder: Secondary | ICD-10-CM | POA: Diagnosis not present

## 2020-05-20 MED ORDER — CLORAZEPATE DIPOTASSIUM 3.75 MG PO TABS: 3.7500 mg | ORAL_TABLET | Freq: Every day | ORAL | 0 refills | Status: DC

## 2020-05-20 NOTE — Telephone Encounter (Signed)
Patient evaluated.  

## 2020-05-20 NOTE — Telephone Encounter (Signed)
Per appt notes pt already has appt scheduled 05/20/20 at 10:40 with Gentry Fitz NP.

## 2020-05-20 NOTE — Telephone Encounter (Signed)
Pt called in wanted to know about getting something done due to she is feeling swimmy headed and nausea, and it didn't start until she took clorazepate. I am getting her triaged

## 2020-05-20 NOTE — Telephone Encounter (Signed)
[facility] - Client TELEPHONE ADVICE RECORD [facility] Patient Name: Kristen Caldwell Gender: Female DOB: [birth year] Age: 75 Y 29 M 27 D Return Phone Number: [phone] (Primary) [place] [address] Client [facility] - Client Client Site [place] - Day Physician Waunita Schooner- MD Contact Type Call Who Is Calling Patient / Member / Family / Caregiver Call Type Triage / Clinical Relationship To Patient Self Return Phone Number [phone] (Primary) Chief Complaint Dizziness Reason for Call Symptomatic / Request for Health Information Initial Comment She took med. and now feels nauseated and lightheaded. Has been going on since saturday when she started the med. Translation No Nurse Assessment Nurse: Kristen Sa, RN, Cathy Date/Time (Eastern Time): 05/20/2020 8:15:45 AM Confirm and document reason for call. If symptomatic, describe symptoms. ---Kristen Caldwell states that she has been feeling dizzy and nauseated for the past 4 days. No severe breathing difficulty or blueness around her lips. No chest pain. No injury. She last passed urine about 10 minutes ago. She completed antibiotics about 3 days ago for UTI. No symptoms or fever. Alert and responsive. Does the patient have any new or worsening symptoms? ---Yes Will a triage be completed? ---Yes Related visit to physician within the last 2 weeks? ---No Does the PT have any chronic conditions? (i.e. diabetes, asthma, this includes High risk factors for pregnancy, etc.) ---Yes List chronic conditions. ---Anxiety, recent UTI (completed antibiotics about 3 days ago) Is this a behavioral health or substance abuse call? ---No Guidelines Guideline Title Affirmed Question Affirmed Notes Nurse Date/Time (Eastern Time) Dizziness - Lightheadedness [1] MODERATE dizziness (e.g., interferes with normal activities) AND [2] has NOT  been evaluated by physician for this (Exception: dizziness caused by heat exposure, sudden standing, or poor fluid intake) [place], RN, [facility] 05/20/2020 8:18:44 AM Disp. Time Kristen Caldwell Time) Disposition Final User PLEASE NOTE: All timestamps contained within this report are represented as [place] Standard Time. CONFIDENTIALTY NOTICE: This fax transmission is intended only for the addressee. It contains information that is legally privileged, confidential or otherwise protected from use or disclosure. If you are not the intended recipient, you are strictly prohibited from reviewing, disclosing, copying using or disseminating any of this information or taking any action in reliance on or regarding this information. If you have received this fax in error, please notify us immediately by telephone so that we can arrange for its return to [place]. Phone: [phone], Toll-Free: [phone], Fax: [phone] Page: 2 of 2 Call Id: [id number] 05/20/2020 8:23:11 AM See PCP within 24 Hours Yes Kristen Sa, RN, Kristen Caldwell Disagree/Comply Comply Caller Understands Yes PreDisposition Call Doctor Care Advice Given Per Guideline SEE PCP WITHIN 24 HOURS: * IF OFFICE WILL BE OPEN: You need to be examined within the next 24 hours. Call your doctor (or NP/PA) when the office opens and make an appointment. DRINK FLUIDS: * Drink several glasses of fruit juice, other clear fluids or water. * This will improve hydration and blood glucose. LIE DOWN AND REST: * Lie down with feet elevated for 1 hour. * This will improve circulation and increase blood flow to the brain. CALL BACK IF: * Passes out (faints) * You become worse CARE ADVICE given per Dizziness (Adult) guideline. Comments User: Kristen Mount, RN Date/Time Kristen Caldwell Time): 05/20/2020 8:26:23 AM Transferred to Kristen Caldwell via the office backline to check on appointment options. Referrals REFERRED TO PCP OFFICE Warm transfer to backline

## 2020-05-20 NOTE — Telephone Encounter (Signed)
Patient states that the pharmacy will not refill her prescription for the following due to the prescription not saying "ok to refill early" . Patient is requesting this to be resubmitted. Medication: Clorazepate  3.75 mg Please advise.  [place]

## 2020-05-20 NOTE — Telephone Encounter (Signed)
Kristen Caldwell, see other phone note. I am fine with having her refill the clorazepate early with [facility] as it seems that the other manufacturer from [facility] causes her to feel poorly.

## 2020-05-20 NOTE — Patient Instructions (Signed)
Continue to work on hydration with water and propel.  I resent your clorazepate to [place] as requested.  Follow up with Dr. Einar Pheasant if symptoms return.   It was a pleasure meeting you!

## 2020-05-20 NOTE — Progress Notes (Signed)
Subjective:    Patient ID: Kristen Caldwell, female    DOB: [birth year], 75 y.o.   MRN: [id number]  HPI  Kristen Caldwell a very pleasant 75 y.o. female patient of Dr. Einar Pheasant with a history of migraines, CKD, anxiety, depression, Covid-19 infection who presents today with a chief complaint of dizziness.  She presented to [facility] ED on 05/12/2020 via paramedics with a chief complaint of near syncope.  She endorsed a history of this in the past when dehydrated.  She was standing up at work, felt lightheaded and sat down with improvement.  While paramedics were attempting to put her on the stretcher she became orthostatic again.  During her stay in the ED she underwent EKG, blood work, chest x-ray all of which were without acute processes.  She was diagnosed with UTI and prescribed cefdinir, no culture obtained. Symptoms improved while in the ED, so she was discharged home later that day with recommendations for PCP follow-up  Since her ED stay she's completed her cefdinir. She is drinking 2 bottles (36 oz) water daily with added Propel powder, also decaf tea. Her liquid intake is increasing from her baseline intake.   She is mostly concerned about her clorazepate, was provided with a different manufacturer from [facility], typically gets refills from [facility]. She suspects that the different manufacturer of her clorazepate caused her symptoms that sent her to the ED. Each time she's taken one of the new manufacturer of clorazepate she feels "awful" with symptoms of "drug overdose", "wandering like I'm in another world", nausea, dizzy. Her last dose of the new manufacturer was 1/2 tablet yesterday, felt awful yesterday. This morning she had 1/2 tablet of her original prescription from [facility] and is feeling better.   She denies near syncope, chest pain, dizziness.    Review of Systems  Respiratory: Negative for shortness of breath.   Cardiovascular: Negative for chest pain.   Neurological: Negative for dizziness.         Past Medical History:  Diagnosis Date  . Anxiety   . Chronic headaches   . Depression   . Diverticulosis   . GERD (gastroesophageal reflux disease)   . Osteoporosis     Social History   Socioeconomic History  . Marital status: Married    Spouse name: Gwyndolyn Saxon  . Number of children: 2  . Years of education: high school  . Highest education level: Not on file  Occupational History  . Not on file  Tobacco Use  . Smoking status: Never Smoker  . Smokeless tobacco: Never Used  Vaping Use  . Vaping Use: Never used  Substance and Sexual Activity  . Alcohol use: No  . Drug use: No  . Sexual activity: Not Currently  Other Topics Concern  . Not on file  Social History Narrative   04/20/20   From: the area   Living: with son - Simona Huh and husband is in long term care   Work: part-time 2 days a week, making window treatments      Family: Simona Huh and Shirlean Mylar - 2 grandchildren, and 1 adopted grandchildren       Enjoys: work is fun it is with friends      Exercise: walking, bending at work   Diet: tries to eat healthy      Safety   Seat belts: Yes    Guns: Yes  and secure   Safe in relationships: Yes  Social Determinants of Health   Financial Resource Strain: Low Risk   . Difficulty of Paying Living Expenses: Not hard at all  Food Insecurity: No Food Insecurity  . Worried About Charity fundraiser in the Last Year: Never true  . Ran Out of Food in the Last Year: Never true  Transportation Needs: No Transportation Needs  . Lack of Transportation (Medical): No  . Lack of Transportation (Non-Medical): No  Physical Activity: Inactive  . Days of Exercise per Week: 0 days  . Minutes of Exercise per Session: 0 min  Stress: No Stress Concern Present  . Feeling of Stress : Not at all  Social Connections: Not on file  Intimate Partner Violence: Not At Risk  . Fear of Current or Ex-Partner: No  . Emotionally  Abused: No  . Physically Abused: No  . Sexually Abused: No    Past Surgical History:  Procedure Laterality Date  . ABDOMINAL HYSTERECTOMY  03/14/1989   partial  . COLONOSCOPY    . TUBAL LIGATION      Family History  Problem Relation Age of Onset  . Aneurysm Mother   . Stroke Father   . Hypertension Father   . Hypertension Brother   . Heart attack Brother   . Lupus Sister   . Diabetes Paternal Grandfather   . Breast cancer Paternal Aunt   . Colon cancer Neg Hx     Allergies  Allergen Reactions  . Amoxicillin     REACTION: NAUSEA  . Cephalexin     REACTION: NAUSEA AND VOMITING  . Penicillins     REACTION: TOOK TABLETS WITHOUT PROBLEM  . Sertraline Diarrhea  . Promethazine Hcl Rash    Current Outpatient Medications on File Prior to Visit  Medication Sig Dispense Refill  . amitriptyline (ELAVIL) 25 MG tablet TAKE 1 TABLET BY MOUTH AT BEDTIME AS NEEDED FOR SLEEP 90 tablet 1  . Calcium Carbonate-Vit D-Min (CALTRATE 600+D PLUS) 600-400 MG-UNIT per tablet Take 2 tablets by mouth daily.    . clorazepate (TRANXENE) 3.75 MG tablet Take 1 tablet (3.75 mg total) by mouth daily. 90 tablet 1  . estradiol (ESTRACE) 0.5 MG tablet TAKE 1 TABLET(0.5 MG) BY MOUTH DAILY 90 tablet 2  . Multiple Vitamins-Minerals (CENTRUM SILVER) CHEW Chew 1 tablet by mouth daily.    . Omega-3 Fatty Acids (FISH OIL PO) Take 1 capsule by mouth 2 (two) times daily.    Marland Kitchen omeprazole (PRILOSEC) 20 MG capsule TAKE 1 CAPSULE(20 MG) BY MOUTH DAILY 90 capsule 2  . topiramate (TOPAMAX) 25 MG tablet TAKE 1 TABLET(25 MG) BY MOUTH TWICE DAILY 180 tablet 2  . TURMERIC PO Take by mouth.    . ondansetron (ZOFRAN) 8 MG tablet Take 1 tablet (8 mg total) by mouth every 8 (eight) hours as needed for nausea or vomiting. (Patient not taking: Reported on 05/20/2020) 30 tablet 2   No current facility-administered medications on file prior to visit.    BP 130/62   Pulse 60   Temp 97.6 F (36.4 C) (Temporal)   Ht 5\' 2"  (1.575  m)   Wt 96 lb (43.5 kg)   SpO2 100%   BMI 17.56 kg/m  Objective:   Physical Exam Constitutional:      Appearance: She is well-nourished.  Eyes:     Extraocular Movements: Extraocular movements intact.  Cardiovascular:     Rate and Rhythm: Normal rate and regular rhythm.  Pulmonary:     Effort: Pulmonary effort is normal.  Breath sounds: Normal breath sounds.  Musculoskeletal:     Cervical back: Neck supple.  Skin:    General: Skin is warm and dry.  Neurological:     Mental Status: She is oriented to person, place, and time.     Cranial Nerves: No cranial nerve deficit.  Psychiatric:        Mood and Affect: Mood and affect and mood normal.           Assessment & Plan:      This visit occurred during the SARS-CoV-2 public health emergency.  Safety protocols were in place, including screening questions prior to the visit, additional usage of staff PPE, and extensive cleaning of exam room while observing appropriate contact time as indicated for disinfecting solutions.

## 2020-05-20 NOTE — Telephone Encounter (Signed)
Film/video editor at [employer] left a voicemail stating that patient told her that you okayed an early refill on her Clorazepate and there was nothing on the script showing that. Kristen Caldwell is requesting a call back confirming that it is okay for patient to get this filled early.

## 2020-05-20 NOTE — Assessment & Plan Note (Signed)
Recently evaluated at [facility] ED for symptoms, work-up grossly unremarkable.    Unclear if symptoms were secondary to her new clorazepate manufacturer or from potential UTI.  Unfortunately urine culture was not obtained, but she is feeling better since she completed her antibiotics.  She is very distraught about her clorazepate prescription being sent to [facility] rather than [facility], removed [facility] from her pharmacy list.  Continue to work on hydration with water. Hospital notes, labs, imaging reviewed.

## 2020-05-20 NOTE — Assessment & Plan Note (Signed)
Discussed to exercise caution with benzo use, especially for her age and weight.  Will resend clorazepate prescription to her preferred pharmacy of [place], discussed that she will have to pay cash price as her insurance has already paid for a 90-day supply from [facility].  GoodRx card provided  Removed [facility] from her pharmacy list.

## 2020-05-20 NOTE — Telephone Encounter (Addendum)
Please kindly notify patient that the prescription IS too soon to refill, we know this as she recently picked up a 90 day supply on 05/12/20 at [facility]. She preferred the original manufacturer of clorazepate which is at [facility] so we tried to send in another fill at her preferred pharmacy. This seems to have been rejected.   We also discussed that she may not be able to refill another 90-day supply as this is a controlled substance.  We also discussed that her insurance would definitely not pay for another 90-day supply as she picked up a full 90 day supply on 05/12/2020.  I cannot do anything regarding the pharmacy's decision to fill the clorazepate at [facility] or anywhere else, these are national/state laws. She will need to wait another 90 days before she can fill.  Joellen, will you call [facility] first to confirm what I am saying is true regarding the refill I sent in on 05/20/20? See my notes from 03/09.

## 2020-05-21 NOTE — Telephone Encounter (Signed)
Called pharmacy states that patient needs bring old script to our office to "have destroyed" once that has been done we can call pharmacy and they will agree to fill. Patient has been made aware and will bring pills by our office.   Once received we will need to count and make sure we have full amount. Then we will call pharmacy to have filled. Patient has been made aware that her insurance will not pay for refill. She will pay out of pocket price that could be as much as $317. Patient has good rx and thinks it will bee much lower but is aware that she will have to pay out of pocket and will not be covered.

## 2020-05-21 NOTE — Telephone Encounter (Signed)
Medication disposed of in sharps container with witness, Cardell Peach, Therapist, sports.

## 2020-05-21 NOTE — Telephone Encounter (Signed)
See phone note for documentation.

## 2020-05-21 NOTE — Telephone Encounter (Signed)
Pt came to office with medication. Medication was counted and verified.  Contacted Educational psychologist, Kirsten, and advised med was all here. She advised pt can pick up prescription. Advised pt will have a Good RX coupon. Kirsten verbalized understanding.   Gave pt Good RX coupon which will make the medication about $100 for the pt rather than the retail price of over $300. Advised pt if anything else is needed to contact the office. Pt verbalized understanding.

## 2020-05-21 NOTE — Telephone Encounter (Signed)
See other note for information.

## 2020-05-21 NOTE — Telephone Encounter (Signed)
Noted.  Kristen Caldwell... long story but wanted you to be in the loop. She has a visit with you scheduled for 03/14. See my office notes from her visit with me.

## 2020-05-22 NOTE — Telephone Encounter (Signed)
Witnessed Randall An, RN dispose of medication properly in the sharps container.

## 2020-05-25 ENCOUNTER — Other Ambulatory Visit: Payer: Self-pay

## 2020-05-25 ENCOUNTER — Ambulatory Visit: Payer: PPO | Admitting: Endocrinology

## 2020-05-25 ENCOUNTER — Ambulatory Visit (INDEPENDENT_AMBULATORY_CARE_PROVIDER_SITE_OTHER): Payer: PPO | Admitting: Family Medicine

## 2020-05-25 ENCOUNTER — Encounter: Payer: Self-pay | Admitting: Family Medicine

## 2020-05-25 VITALS — BP 100/60 | HR 72 | Temp 97.3°F | Ht 62.0 in | Wt 96.0 lb

## 2020-05-25 DIAGNOSIS — F410 Panic disorder [episodic paroxysmal anxiety] without agoraphobia: Secondary | ICD-10-CM | POA: Diagnosis not present

## 2020-05-25 DIAGNOSIS — R55 Syncope and collapse: Secondary | ICD-10-CM

## 2020-05-25 DIAGNOSIS — R8290 Unspecified abnormal findings in urine: Secondary | ICD-10-CM | POA: Diagnosis not present

## 2020-05-25 DIAGNOSIS — F411 Generalized anxiety disorder: Secondary | ICD-10-CM

## 2020-05-25 DIAGNOSIS — R42 Dizziness and giddiness: Secondary | ICD-10-CM | POA: Diagnosis not present

## 2020-05-25 LAB — POC URINALSYSI DIPSTICK (AUTOMATED)
Bilirubin, UA: NEGATIVE
Glucose, UA: NEGATIVE
Ketones, UA: NEGATIVE
Nitrite, UA: NEGATIVE
Protein, UA: NEGATIVE
Spec Grav, UA: 1.01 (ref 1.010–1.025)
Urobilinogen, UA: 0.2 E.U./dL
pH, UA: 6 (ref 5.0–8.0)

## 2020-05-25 MED ORDER — CLORAZEPATE DIPOTASSIUM 3.75 MG PO TABS: 3.7500 mg | ORAL_TABLET | Freq: Every day | ORAL | 0 refills | Status: DC

## 2020-05-25 NOTE — Progress Notes (Signed)
Subjective:     Kristen Caldwell is a 75 y.o. female presenting for ER followup (Syncope and UTI )     HPI  #Presyncope - was taking b12 a while ago - has been trying to drink more  - some days worse than other - has been drinking 2 bottles of water  - also putting propel  - will also drink decaf tea - 2 glasses per day  - has noticed improvement with dizziness with increased hydration - dizziness is only in the mornings - typically does not drink water until   #Generalized anxiety - has been on clorazepate for years - did get a different pill prescribed   Review of Systems   Social History   Tobacco Use  Smoking Status Never Smoker  Smokeless Tobacco Never Used        Objective:    BP Readings from Last 3 Encounters:  05/25/20 100/60  05/20/20 130/62  05/12/20 127/62   Wt Readings from Last 3 Encounters:  05/25/20 96 lb (43.5 kg)  05/20/20 96 lb (43.5 kg)  05/12/20 95 lb (43.1 kg)    BP 100/60   Pulse 72   Temp (!) 97.3 F (36.3 C) (Temporal)   Ht 5\' 2"  (1.575 m)   Wt 96 lb (43.5 kg)   SpO2 98%   BMI 17.56 kg/m    Physical Exam Constitutional:      General: She is not in acute distress.    Appearance: She is well-developed. She is not diaphoretic.  HENT:     Right Ear: External ear normal.     Left Ear: External ear normal.  Eyes:     Conjunctiva/sclera: Conjunctivae normal.  Cardiovascular:     Rate and Rhythm: Normal rate and regular rhythm.     Heart sounds: No murmur heard.   Pulmonary:     Effort: Pulmonary effort is normal. No respiratory distress.     Breath sounds: Normal breath sounds. No wheezing.  Musculoskeletal:     Cervical back: Neck supple.  Skin:    General: Skin is warm and dry.     Capillary Refill: Capillary refill takes less than 2 seconds.  Neurological:     Mental Status: She is alert. Mental status is at baseline.  Psychiatric:        Mood and Affect: Mood normal.        Behavior: Behavior  normal.      UA: + LE    Assessment & Plan:   Problem List Items Addressed This Visit      Cardiovascular and Mediastinum   Postural dizziness with near syncope - Primary    Long discussion about suspected cause of dehydration. Increased water intake specifically first thing in the morning        Other   Generalized anxiety disorder with panic attacks    Stable on clorazepate for years. Will refill. Return 6 months.       Relevant Medications   clorazepate (TRANXENE) 3.75 MG tablet (Start on 08/19/2020)    Other Visit Diagnoses    Abnormal urine finding       Relevant Orders   POCT Urinalysis Dipstick (Automated) (Completed)     Discussed +LE on urine dip as likely related to incompletely clean catch. As no symptoms would not recommend UCx or treatment at this time. Advised f/u if symptoms and would repeat urine.    Return in about 6 months (around 11/25/2020) for annual and med refill .  [name], MD  This visit occurred during the SARS-CoV-2 public health emergency.  Safety protocols were in place, including screening questions prior to the visit, additional usage of staff PPE, and extensive cleaning of exam room while observing appropriate contact time as indicated for disinfecting solutions.

## 2020-05-25 NOTE — Assessment & Plan Note (Signed)
Long discussion about suspected cause of dehydration. Increased water intake specifically first thing in the morning

## 2020-05-25 NOTE — Assessment & Plan Note (Signed)
Stable on clorazepate for years. Will refill. Return 6 months.

## 2020-05-25 NOTE — Patient Instructions (Signed)
Try to drink 8 oz of water before your morning coffee  Try to drink 64 oz of decaffeinated beverages per day

## 2020-06-09 ENCOUNTER — Ambulatory Visit: Payer: PPO | Admitting: Internal Medicine

## 2020-06-09 ENCOUNTER — Other Ambulatory Visit: Payer: Self-pay

## 2020-06-09 ENCOUNTER — Ambulatory Visit (INDEPENDENT_AMBULATORY_CARE_PROVIDER_SITE_OTHER): Payer: PPO | Admitting: Internal Medicine

## 2020-06-09 ENCOUNTER — Encounter: Payer: Self-pay | Admitting: Internal Medicine

## 2020-06-09 VITALS — BP 112/70 | HR 94 | Temp 96.9°F | Wt 97.0 lb

## 2020-06-09 DIAGNOSIS — R3915 Urgency of urination: Secondary | ICD-10-CM

## 2020-06-09 DIAGNOSIS — R300 Dysuria: Secondary | ICD-10-CM

## 2020-06-09 LAB — POC URINALSYSI DIPSTICK (AUTOMATED)
Bilirubin, UA: NEGATIVE
Glucose, UA: NEGATIVE
Ketones, UA: NEGATIVE
Nitrite, UA: NEGATIVE
Protein, UA: NEGATIVE
Spec Grav, UA: 1.02 (ref 1.010–1.025)
Urobilinogen, UA: 0.2 E.U./dL
pH, UA: 5.5 (ref 5.0–8.0)

## 2020-06-09 MED ORDER — NITROFURANTOIN MONOHYD MACRO 100 MG PO CAPS
100.0000 mg | ORAL_CAPSULE | Freq: Two times a day (BID) | ORAL | 0 refills | Status: DC
Start: 2020-06-09 — End: 2020-10-05

## 2020-06-09 NOTE — Addendum Note (Signed)
Addended by: Lurlean Nanny on: 06/09/2020 03:56 PM   Modules accepted: Orders

## 2020-06-09 NOTE — Addendum Note (Signed)
Addended by: Lurlean Nanny on: 06/09/2020 03:43 PM   Modules accepted: Orders

## 2020-06-09 NOTE — Progress Notes (Signed)
HPI  Pt presents to the clinic today with c/o urinary urgency and dysuria. This started 4 days ago. She denies frequency, blood in her urine. She denies vaginal discharge, odor or abnormal bleeding. She denies fever, chills, nausea or low back pain. She has not tried anything OTC for her symptoms.   Review of Systems  Past Medical History:  Diagnosis Date  . Anxiety   . Chronic headaches   . Depression   . Diverticulosis   . GERD (gastroesophageal reflux disease)   . Osteoporosis     Family History  Problem Relation Age of Onset  . Aneurysm Mother   . Stroke Father   . Hypertension Father   . Hypertension Brother   . Heart attack Brother   . Lupus Sister   . Diabetes Paternal Grandfather   . Breast cancer Paternal Aunt   . Colon cancer Neg Hx     Social History   Socioeconomic History  . Marital status: Married    Spouse name: Gwyndolyn Saxon  . Number of children: 2  . Years of education: high school  . Highest education level: Not on file  Occupational History  . Not on file  Tobacco Use  . Smoking status: Never Smoker  . Smokeless tobacco: Never Used  Vaping Use  . Vaping Use: Never used  Substance and Sexual Activity  . Alcohol use: No  . Drug use: No  . Sexual activity: Not Currently  Other Topics Concern  . Not on file  Social History Narrative   04/20/20   From: the area   Living: with son - Simona Huh and husband is in long term care   Work: part-time 2 days a week, making window treatments      Family: Simona Huh and Shirlean Mylar - 2 grandchildren, and 1 adopted grandchildren       Enjoys: work is fun it is with friends      Exercise: walking, bending at work   Diet: tries to eat healthy      Safety   Seat belts: Yes    Guns: Yes  and secure   Safe in relationships: Yes             Social Determinants of Health   Financial Resource Strain: Low Risk   . Difficulty of Paying Living Expenses: Not hard at all  Food Insecurity: No Food Insecurity  . Worried  About Charity fundraiser in the Last Year: Never true  . Ran Out of Food in the Last Year: Never true  Transportation Needs: No Transportation Needs  . Lack of Transportation (Medical): No  . Lack of Transportation (Non-Medical): No  Physical Activity: Inactive  . Days of Exercise per Week: 0 days  . Minutes of Exercise per Session: 0 min  Stress: No Stress Concern Present  . Feeling of Stress : Not at all  Social Connections: Not on file  Intimate Partner Violence: Not At Risk  . Fear of Current or Ex-Partner: No  . Emotionally Abused: No  . Physically Abused: No  . Sexually Abused: No    Allergies  Allergen Reactions  . Amoxicillin     REACTION: NAUSEA  . Cephalexin     REACTION: NAUSEA AND VOMITING  . Penicillins     REACTION: TOOK TABLETS WITHOUT PROBLEM  . Sertraline Diarrhea  . Promethazine Hcl Rash     Constitutional: Denies fever, malaise, fatigue, headache or abrupt weight changes.   GU: Pt reports urgency and pain with urination. Denies  frequency, burning sensation, blood in urine, odor or discharge. Skin: Denies redness, rashes, lesions or ulcercations.   No other specific complaints in a complete review of systems (except as listed in HPI above).    Objective:   Physical Exam BP 112/70   Pulse 94   Temp (!) 96.9 F (36.1 C) (Temporal)   Wt 97 lb (44 kg)   SpO2 98%   BMI 17.74 kg/m   Wt Readings from Last 3 Encounters:  05/25/20 96 lb (43.5 kg)  05/20/20 96 lb (43.5 kg)  05/12/20 95 lb (43.1 kg)    General: Appears her stated age, well developed, well nourished in NAD. Cardiovascular: Normal rate.  Pulmonary/Chest: Normal effort. No respiratory distress. No wheezes, rales or ronchi noted.          Assessment & Plan:   Urgency, Dysuria:  Urinalysis: 1+ leuks, trace blood Will send urine culture eRx sent if for Macrobid 100 mg BID x 5 days OK to take AZO OTC Drink plenty of fluids  RTC as needed or if symptoms persist. Webb Silversmith,  NP This visit occurred during the SARS-CoV-2 public health emergency.  Safety protocols were in place, including screening questions prior to the visit, additional usage of staff PPE, and extensive cleaning of exam room while observing appropriate contact time as indicated for disinfecting solutions.

## 2020-06-09 NOTE — Patient Instructions (Signed)
Urinary Tract Infection, Adult A urinary tract infection (UTI) is an infection of any part of the urinary tract. The urinary tract includes:  The kidneys.  The ureters.  The bladder.  The urethra. These organs make, store, and get rid of pee (urine) in the body. What are the causes? This infection is caused by germs (bacteria) in your genital area. These germs grow and cause swelling (inflammation) of your urinary tract. What increases the risk? The following factors may make you more likely to develop this condition:  Using a small, thin tube (catheter) to drain pee.  Not being able to control when you pee or poop (incontinence).  Being female. If you are female, these things can increase the risk: ? Using these methods to prevent pregnancy:  A medicine that kills sperm (spermicide).  A device that blocks sperm (diaphragm). ? Having low levels of a female hormone (estrogen). ? Being pregnant. You are more likely to develop this condition if:  You have genes that add to your risk.  You are sexually active.  You take antibiotic medicines.  You have trouble peeing because of: ? A prostate that is bigger than normal, if you are female. ? A blockage in the part of your body that drains pee from the bladder. ? A kidney stone. ? A nerve condition that affects your bladder. ? Not getting enough to drink. ? Not peeing often enough.  You have other conditions, such as: ? Diabetes. ? A weak disease-fighting system (immune system). ? Sickle cell disease. ? Gout. ? Injury of the spine. What are the signs or symptoms? Symptoms of this condition include:  Needing to pee right away.  Peeing small amounts often.  Pain or burning when peeing.  Blood in the pee.  Pee that smells bad or not like normal.  Trouble peeing.  Pee that is cloudy.  Fluid coming from the vagina, if you are female.  Pain in the belly or lower back. Other symptoms include:  Vomiting.  Not  feeling hungry.  Feeling mixed up (confused). This may be the first symptom in older adults.  Being tired and grouchy (irritable).  A fever.  Watery poop (diarrhea). How is this treated?  Taking antibiotic medicine.  Taking other medicines.  Drinking enough water. In some cases, you may need to see a specialist. Follow these instructions at home: Medicines  Take over-the-counter and prescription medicines only as told by your doctor.  If you were prescribed an antibiotic medicine, take it as told by your doctor. Do not stop taking it even if you start to feel better. General instructions  Make sure you: ? Pee until your bladder is empty. ? Do not hold pee for a long time. ? Empty your bladder after sex. ? Wipe from front to back after peeing or pooping if you are a female. Use each tissue one time when you wipe.  Drink enough fluid to keep your pee pale yellow.  Keep all follow-up visits.   Contact a doctor if:  You do not get better after 1-2 days.  Your symptoms go away and then come back. Get help right away if:  You have very bad back pain.  You have very bad pain in your lower belly.  You have a fever.  You have chills.  You feeling like you will vomit or you vomit. Summary  A urinary tract infection (UTI) is an infection of any part of the urinary tract.  This condition is caused by   germs in your genital area.  There are many risk factors for a UTI.  Treatment includes antibiotic medicines.  Drink enough fluid to keep your pee pale yellow. This information is not intended to replace advice given to you by your health care provider. Make sure you discuss any questions you have with your health care provider. Document Revised: 10/11/2019 Document Reviewed: 10/11/2019 Elsevier Patient Education  2021 [employer]  

## 2020-06-10 LAB — URINE CULTURE
MICRO NUMBER:: 11705609
Result:: NO GROWTH
SPECIMEN QUALITY:: ADEQUATE

## 2020-07-27 ENCOUNTER — Encounter: Payer: Self-pay | Admitting: Endocrinology

## 2020-07-27 ENCOUNTER — Other Ambulatory Visit: Payer: Self-pay

## 2020-07-27 ENCOUNTER — Ambulatory Visit: Payer: PPO | Admitting: Endocrinology

## 2020-07-27 VITALS — BP 124/84 | HR 110 | Ht 62.0 in | Wt 96.4 lb

## 2020-07-27 DIAGNOSIS — M810 Age-related osteoporosis without current pathological fracture: Secondary | ICD-10-CM

## 2020-07-27 LAB — T4, FREE: Free T4: 0.79 ng/dL (ref 0.60–1.60)

## 2020-07-27 LAB — TSH: TSH: 2.89 u[IU]/mL (ref 0.35–4.50)

## 2020-07-27 LAB — VITAMIN D 25 HYDROXY (VIT D DEFICIENCY, FRACTURES): VITD: 56.57 ng/mL (ref 30.00–100.00)

## 2020-07-27 MED ORDER — ROMOSOZUMAB-AQQG 105 MG/1.17ML ~~LOC~~ SOSY: 210.0000 mg | PREFILLED_SYRINGE | SUBCUTANEOUS | 11 refills | Status: DC

## 2020-07-27 NOTE — Progress Notes (Signed)
Subjective:    Patient ID: Kristen Caldwell, female    DOB: 1945-08-06, 75 y.o.   MRN: [id number]  HPI Pt is referred by Dr Einar Pheasant, for osteoporosis.  Pt was noted to have osteoporosis in 2012.  She took one dose of Prolia.  She says this caused insomnia.  She then took Fosamax, and later, Evista (both caused nausea).  she has never had bony fracture was 5th toe (as a child--traumatic).  She had AH (no BSO) at age 86.  She has no history of any of the following: multiple myeloma, thyroid problems, steroids, alcoholism, smoking, liver dz, Vit-D deficiency, gastric bypass, and hyperparathyroidism.  She does not take heparin or anticonvulsants.  She has been on E2 x 30 years.  A trial off it in 2000 caused vaginal dryness.   Past Medical History:  Diagnosis Date  . Anxiety   . Chronic headaches   . Depression   . Diverticulosis   . GERD (gastroesophageal reflux disease)   . Osteoporosis     Past Surgical History:  Procedure Laterality Date  . ABDOMINAL HYSTERECTOMY  03/14/1989   partial  . COLONOSCOPY    . TUBAL LIGATION      Social History   Socioeconomic History  . Marital status: Married    Spouse name: Gwyndolyn Saxon  . Number of children: 2  . Years of education: high school  . Highest education level: Not on file  Occupational History  . Not on file  Tobacco Use  . Smoking status: Never Smoker  . Smokeless tobacco: Never Used  Vaping Use  . Vaping Use: Never used  Substance and Sexual Activity  . Alcohol use: No  . Drug use: No  . Sexual activity: Not Currently  Other Topics Concern  . Not on file  Social History Narrative   04/20/20   From: the area   Living: with son - Simona Huh and husband is in long term care   Work: part-time 2 days a week, making window treatments      Family: Simona Huh and Shirlean Mylar - 2 grandchildren, and 1 adopted grandchildren       Enjoys: work is fun it is with friends      Exercise: walking, bending at work   Diet: tries to eat healthy       Safety   Seat belts: Yes    Guns: Yes  and secure   Safe in relationships: Yes             Social Determinants of Health   Financial Resource Strain: Low Risk   . Difficulty of Paying Living Expenses: Not hard at all  Food Insecurity: No Food Insecurity  . Worried About Charity fundraiser in the Last Year: Never true  . Ran Out of Food in the Last Year: Never true  Transportation Needs: No Transportation Needs  . Lack of Transportation (Medical): No  . Lack of Transportation (Non-Medical): No  Physical Activity: Inactive  . Days of Exercise per Week: 0 days  . Minutes of Exercise per Session: 0 min  Stress: No Stress Concern Present  . Feeling of Stress : Not at all  Social Connections: Not on file  Intimate Partner Violence: Not At Risk  . Fear of Current or Ex-Partner: No  . Emotionally Abused: No  . Physically Abused: No  . Sexually Abused: No    Current Outpatient Medications on File Prior to Visit  Medication Sig Dispense Refill  . amitriptyline (ELAVIL) 25  MG tablet TAKE 1 TABLET BY MOUTH AT BEDTIME AS NEEDED FOR SLEEP 90 tablet 1  . Calcium Carbonate-Vit D-Min (CALTRATE 600+D PLUS) 600-400 MG-UNIT per tablet Take 2 tablets by mouth daily.    Derrill Memo ON 08/19/2020] clorazepate (TRANXENE) 3.75 MG tablet Take 1 tablet (3.75 mg total) by mouth daily. 90 tablet 0  . estradiol (ESTRACE) 0.5 MG tablet TAKE 1 TABLET(0.5 MG) BY MOUTH DAILY 90 tablet 2  . Multiple Vitamins-Minerals (CENTRUM SILVER) CHEW Chew 1 tablet by mouth daily.    . nitrofurantoin, macrocrystal-monohydrate, (MACROBID) 100 MG capsule Take 1 capsule (100 mg total) by mouth 2 (two) times daily. 10 capsule 0  . Omega-3 Fatty Acids (FISH OIL PO) Take 1 capsule by mouth 2 (two) times daily.    Marland Kitchen omeprazole (PRILOSEC) 20 MG capsule TAKE 1 CAPSULE(20 MG) BY MOUTH DAILY 90 capsule 2  . ondansetron (ZOFRAN) 8 MG tablet Take 1 tablet (8 mg total) by mouth every 8 (eight) hours as needed for nausea or vomiting.  30 tablet 2  . topiramate (TOPAMAX) 25 MG tablet TAKE 1 TABLET(25 MG) BY MOUTH TWICE DAILY 180 tablet 2  . TURMERIC PO Take by mouth.    . vitamin B-12 (CYANOCOBALAMIN) 1000 MCG tablet Take 1,000 mcg by mouth daily.     No current facility-administered medications on file prior to visit.    Allergies  Allergen Reactions  . Amoxicillin     REACTION: NAUSEA  . Cephalexin     REACTION: NAUSEA AND VOMITING  . Penicillins     REACTION: TOOK TABLETS WITHOUT PROBLEM  . Sertraline Diarrhea  . Promethazine Hcl Rash    Family History  Problem Relation Age of Onset  . Aneurysm Mother   . Stroke Father   . Hypertension Father   . Hypertension Brother   . Heart attack Brother   . Lupus Sister   . Osteoporosis Sister   . Diabetes Paternal Grandfather   . Breast cancer Paternal Aunt   . Colon cancer Neg Hx     BP 124/84 (BP Location: Right Arm, Patient Position: Sitting, Cuff Size: Normal)   Pulse (!) 110   Ht 5' 2"  (1.575 m)   Wt 96 lb 6.4 oz (43.7 kg)   SpO2 96%   BMI 17.63 kg/m    Review of Systems denies weight loss, falls, memory loss, and back pain.  GERD is well-controlled.      Objective:   Physical Exam VITAL SIGNS:  See vs page GENERAL: no distress NECK: There is no palpable thyroid enlargement.  No thyroid nodule is palpable.  No palpable lymphadenopathy at the anterior neck.  SPINE: no kyphosis.  GAIT: normal and steady.    Lab Results  Component Value Date   TSH 2.51 08/28/2019   Lab Results  Component Value Date   CALCIUM 9.4 05/12/2020   Lab Results  Component Value Date   CREATININE 0.81 05/12/2020   BUN 17 05/12/2020   NA 136 05/12/2020   K 4.0 05/12/2020   CL 106 05/12/2020   CO2 19 (L) 05/12/2020   DEXA (2021) worst T-score was -3.9 (LFN).    Mammography (6/21) normal  I have reviewed outside records, and summarized: Pt was noted to have osteoporosis, and referred here.  There was discussion on several occasions about the risks and  benefits of E2     Assessment & Plan:  Osteoporosis, new to me, uncontrolled.   Patient Instructions  I have requested "Evenity" for you.  you will  receive a phone call, about a day and time for an appointment. Please reduce the estradiol to 1/2 pill per day. Blood tests are requested for you today.  We'll let you know about the results.  Please come back for a follow-up appointment in 6 months.

## 2020-07-27 NOTE — Patient Instructions (Addendum)
I have requested "Evenity" for you.  you will receive a phone call, about a day and time for an appointment. Please reduce the estradiol to 1/2 pill per day. Blood tests are requested for you today.  We'll let you know about the results.  Please come back for a follow-up appointment in 6 months.

## 2020-07-28 LAB — PTH, INTACT AND CALCIUM
Calcium: 10 mg/dL (ref 8.6–10.4)
PTH: 20 pg/mL (ref 16–77)

## 2020-08-03 ENCOUNTER — Telehealth: Payer: Self-pay | Admitting: Family Medicine

## 2020-08-03 ENCOUNTER — Other Ambulatory Visit (INDEPENDENT_AMBULATORY_CARE_PROVIDER_SITE_OTHER): Payer: PPO

## 2020-08-03 ENCOUNTER — Other Ambulatory Visit: Payer: Self-pay

## 2020-08-03 ENCOUNTER — Encounter: Payer: Self-pay | Admitting: Family Medicine

## 2020-08-03 ENCOUNTER — Telehealth (INDEPENDENT_AMBULATORY_CARE_PROVIDER_SITE_OTHER): Payer: PPO | Admitting: Family Medicine

## 2020-08-03 ENCOUNTER — Other Ambulatory Visit: Payer: Self-pay | Admitting: Family Medicine

## 2020-08-03 VITALS — Temp 98.2°F | Wt 97.0 lb

## 2020-08-03 DIAGNOSIS — M810 Age-related osteoporosis without current pathological fracture: Secondary | ICD-10-CM

## 2020-08-03 DIAGNOSIS — F411 Generalized anxiety disorder: Secondary | ICD-10-CM

## 2020-08-03 DIAGNOSIS — Z20822 Contact with and (suspected) exposure to covid-19: Secondary | ICD-10-CM | POA: Diagnosis not present

## 2020-08-03 DIAGNOSIS — J029 Acute pharyngitis, unspecified: Secondary | ICD-10-CM

## 2020-08-03 MED ORDER — AMITRIPTYLINE HCL 25 MG PO TABS: 25.0000 mg | ORAL_TABLET | Freq: Every evening | ORAL | 1 refills | Status: DC | PRN

## 2020-08-03 NOTE — Telephone Encounter (Signed)
See encounter from today.

## 2020-08-03 NOTE — Telephone Encounter (Signed)
I spoke with pt; pt saw her husband on 07/31/20 at [facility] in [place] and facility was not restricting visitor but there were 2 pts who were in quarantine but pts husband did not have covid. Pt started feeling bad on 08/02/20 pts symptoms were H/A and tight feeling in her head. Pain level 4.  Temp now is 98.6. no chills or unusual body aches. Pt said her nose is not running but nose feels wet inside. S/T that was scratchy on 08/02/20. Pt took covid test and pt thinks test showed positive.  No other covid symptoms. Pt took tylenol this morning. Pt is going to take another covid test to verify it as positive. Pt will have vitals ready when CMA calls; pt already has video visit scheduled with Dr Einar Pheasant today at 11:40. Advised pt to self quarantine, drink plenty of fluids, rest and take Tylenol for fever or body aches. UC & ED precautions given and pt voiced understanding.

## 2020-08-03 NOTE — Progress Notes (Signed)
I connected with Kristen Caldwell on 08/03/20 at 11:40 AM EDT by video and verified that I am speaking with the correct person using two identifiers.   I discussed the limitations, risks, security and privacy concerns of performing an evaluation and management service by video and the availability of in person appointments. I also discussed with the patient that there may be a patient responsible charge related to this service. The patient expressed understanding and agreed to proceed.  Patient location: Home Provider Location: [facility] Participants: Kristen Caldwell and Ameisha Mcclellan Mccoll   Subjective:     Kristen Caldwell is a 75 y.o. female presenting for Sore Throat, Headache, and Covid Exposure (Possible exposure at [facility] on friday)     HPI  #Covid exposure - on 07/31/2020 - now with HA and sore throat - 08/02/2020 - took a rapid test and it was negative - husband stays at [facility] in [place] - they had an infection w/ 2 patients and they were in quarantine so she saw her husband - did not feel well this morning - has work tomorrow - scratchy throat and headache   Review of Systems  Constitutional: Negative for chills and fever.  HENT: Positive for sore throat. Negative for congestion and rhinorrhea.   Respiratory: Negative for cough.   Gastrointestinal: Negative for diarrhea, nausea and vomiting.  Neurological: Positive for headaches.     Social History   Tobacco Use  Smoking Status Never Smoker  Smokeless Tobacco Never Used        Objective:   BP Readings from Last 3 Encounters:  07/27/20 124/84  06/09/20 112/70  05/25/20 100/60   Wt Readings from Last 3 Encounters:  08/03/20 97 lb (44 kg)  07/27/20 96 lb 6.4 oz (43.7 kg)  06/09/20 97 lb (44 kg)   Temp 98.2 F (36.8 C) (Temporal)   Wt 97 lb (44 kg)   BMI 17.74 kg/m    Physical Exam Constitutional:      Appearance: Normal appearance. She  is not ill-appearing.  HENT:     Head: Normocephalic and atraumatic.     Right Ear: External ear normal.     Left Ear: External ear normal.  Eyes:     Conjunctiva/sclera: Conjunctivae normal.  Pulmonary:     Effort: Pulmonary effort is normal. No respiratory distress.  Neurological:     Mental Status: She is alert. Mental status is at baseline.  Psychiatric:        Mood and Affect: Mood normal.        Behavior: Behavior normal.        Thought Content: Thought content normal.        Judgment: Judgment normal.            Assessment & Plan:   Problem List Items Addressed This Visit      Musculoskeletal and Integument   Osteoporosis - Primary    Saw Dr. Loanne Drilling. Considering Evenity but is nervous about the injection. Has Dexa scheduled but this is too early so she will cancel. Appreciate Dr. Cordelia Pen support. Encouraged treatment.        Other Visit Diagnoses    Sore throat         Discussed OTC treatment for viral illness Patient will come today for drive-up testing Instructed to isolate until the results come back  Discussed hx of covid and infusion in 02/2020 but ok to get booster if not positive for  covid.   Advised mask and isolation until results return  High risk 2/2 to age and would do paxlovid if positive.    Return if symptoms worsen or fail to improve.  Kristen Noe, MD

## 2020-08-03 NOTE — Telephone Encounter (Signed)
Patient called stating that [facility] was suppose to send in refill for her Amytriptyline for her last week but I do not see anything in the system unless it came over as paper fax. Last filled by [facility] on 02/03/20 #90 with 1 refill. LOV with [facility] for acute visit LOV with Dr Einar Pheasant on 05/25/20  No future appointments made.

## 2020-08-03 NOTE — Assessment & Plan Note (Addendum)
Saw Dr. Loanne Drilling. Considering Evenity but is nervous about the injection. Has Dexa scheduled but this is too early so she will cancel. Appreciate Dr. Cordelia Pen support. Encouraged treatment.

## 2020-08-03 NOTE — Telephone Encounter (Signed)
Kristen Caldwell CI AND STATED THAT SHE WENT TO HER HUSBAND IN [place] AND THEY TOLD HER THAT THEY WERE QUARTINE AND SHE TESTED POSTIIVE , TEELS LIKE SHE HAS A COLD

## 2020-08-04 ENCOUNTER — Telehealth: Payer: Self-pay | Admitting: Family Medicine

## 2020-08-04 LAB — NOVEL CORONAVIRUS, NAA: SARS-CoV-2, NAA: NOT DETECTED

## 2020-08-04 LAB — SARS-COV-2, NAA 2 DAY TAT

## 2020-08-04 NOTE — Telephone Encounter (Signed)
At this point I do not feel this is a bacterial infection therefore antibiotics are not needed.   For sinus symptoms I recommend Neti pots and over the counter congestion medication  For headache Tylenol and ibuprofen.  If sinus symptoms persist next week antibiotics may be helpful  We are also still waiting on Covid results which could change plan.

## 2020-08-04 NOTE — Telephone Encounter (Signed)
Patient states that she had an video visit yesterday and was asked by Dr Einar Pheasant if she wanted her to call her in something. Patient is asking for antibiotic as she is having pains in her face and headaches. Please advise. EM

## 2020-08-05 NOTE — Telephone Encounter (Signed)
Spoke to Kristen Caldwell and relayed Dr. Verda Cumins note and suggestions. Also, notified Kristen Caldwell of negative covid results. Kristen Caldwell states that she started taking mucinex sinus yesterday and that seems to help with her symptoms.

## 2020-08-25 ENCOUNTER — Other Ambulatory Visit: Payer: Self-pay

## 2020-08-25 DIAGNOSIS — Z79890 Hormone replacement therapy (postmenopausal): Secondary | ICD-10-CM

## 2020-08-25 MED ORDER — ESTRADIOL 0.5 MG PO TABS: ORAL_TABLET | ORAL | 0 refills | Status: DC

## 2020-09-07 ENCOUNTER — Telehealth: Payer: Self-pay | Admitting: Family Medicine

## 2020-09-07 NOTE — Telephone Encounter (Signed)
Kristen Caldwell called in and wanted to report she got her 1st covid booster - pfizer @ [place] on 6/10

## 2020-09-07 NOTE — Telephone Encounter (Signed)
Chart updated

## 2020-10-02 ENCOUNTER — Telehealth: Payer: Self-pay | Admitting: Family Medicine

## 2020-10-02 NOTE — Telephone Encounter (Signed)
Patient called and informed that she does not need a Tetanus injection. Patient stated that she understands.

## 2020-10-02 NOTE — Telephone Encounter (Signed)
Kristen Caldwell called in and wanted to know about getting the tetanus shot and if it was time for her to get it

## 2020-10-05 ENCOUNTER — Other Ambulatory Visit: Payer: Self-pay

## 2020-10-05 ENCOUNTER — Ambulatory Visit (INDEPENDENT_AMBULATORY_CARE_PROVIDER_SITE_OTHER): Payer: PPO | Admitting: Family Medicine

## 2020-10-05 VITALS — BP 110/58 | HR 95 | Temp 97.6°F | Wt 98.0 lb

## 2020-10-05 DIAGNOSIS — M26629 Arthralgia of temporomandibular joint, unspecified side: Secondary | ICD-10-CM | POA: Insufficient documentation

## 2020-10-05 NOTE — Progress Notes (Signed)
   Subjective:     Kristen Caldwell is a 75 y.o. female presenting for Jaw Pain (L side of jaw pops when eating and then becomes sore x 1 month)     HPI  #Jaw pain - popping whenever he eats - it a lot of chewing will get sore - left side of the jaw - will radiate to the ear - no falls, no trauma - wears retainer for clenching teeth at night - dentist has been planning to make a new one but she stopped using the retainer at night - it has been feeling tight with the jaw popping   Review of Systems Ear pain and occasional popping No headache   Social History   Tobacco Use  Smoking Status Never  Smokeless Tobacco Never        Objective:    BP Readings from Last 3 Encounters:  10/05/20 (!) 110/58  07/27/20 124/84  06/09/20 112/70   Wt Readings from Last 3 Encounters:  10/05/20 98 lb (44.5 kg)  08/03/20 97 lb (44 kg)  07/27/20 96 lb 6.4 oz (43.7 kg)    BP (!) 110/58   Pulse 95   Temp 97.6 F (36.4 C) (Temporal)   Wt 98 lb (44.5 kg)   SpO2 96%   BMI 17.92 kg/m    Physical Exam Constitutional:      General: She is not in acute distress.    Appearance: She is well-developed. She is not diaphoretic.  HENT:     Head:     Jaw: Tenderness present.     Comments: Popping of jaw with forward thrust and extended opening. Pain on the left side with palpation    Right Ear: External ear normal.     Left Ear: External ear normal.  Eyes:     Conjunctiva/sclera: Conjunctivae normal.  Cardiovascular:     Rate and Rhythm: Normal rate.  Pulmonary:     Effort: Pulmonary effort is normal.  Musculoskeletal:     Cervical back: Neck supple.  Skin:    General: Skin is warm and dry.     Capillary Refill: Capillary refill takes less than 2 seconds.  Neurological:     Mental Status: She is alert. Mental status is at baseline.  Psychiatric:        Mood and Affect: Mood normal.        Behavior: Behavior normal.          Assessment & Plan:   Problem  List Items Addressed This Visit       Musculoskeletal and Integument   TMJ syndrome - Primary    Suspect this may be related to chronic neck pain vs grinding of teeth. Encouraged pt to continue massage for neck pain and to contact dentist as she notes her mouth guard is getting smaller. Discussed avoiding hard to eat/chew items. Isometric home exercises. Voltaren gel for pain if present.          Return if symptoms worsen or fail to improve.  [name], MD  This visit occurred during the SARS-CoV-2 public health emergency.  Safety protocols were in place, including screening questions prior to the visit, additional usage of staff PPE, and extensive cleaning of exam room while observing appropriate contact time as indicated for disinfecting solutions.

## 2020-10-05 NOTE — Assessment & Plan Note (Signed)
Suspect this may be related to chronic neck pain vs grinding of teeth. Encouraged pt to continue massage for neck pain and to contact dentist as she notes her mouth guard is getting smaller. Discussed avoiding hard to eat/chew items. Isometric home exercises. Voltaren gel for pain if present.

## 2020-10-05 NOTE — Patient Instructions (Addendum)
Exercise A: Chin tucks You can do this exercise sitting, standing, or lying down. Move your head straight back, keeping your head level. You can guide the movement by placing your fingers on your chin to push your jaw back in an even motion. You should be able to feel a double chin form at the end of the motion. Hold this position for 5 seconds. Repeat 10-15 times.   Check with your dentist about the mouth guard   Other exercises printed out  Can also try voltaren get on the side if pain   Avoid eating things that require you to open the mouth wide - hamburger, apples, sandwiches.

## 2020-10-06 ENCOUNTER — Other Ambulatory Visit: Payer: Self-pay | Admitting: Family Medicine

## 2020-10-06 DIAGNOSIS — K219 Gastro-esophageal reflux disease without esophagitis: Secondary | ICD-10-CM

## 2020-10-08 ENCOUNTER — Other Ambulatory Visit: Payer: Self-pay | Admitting: Family Medicine

## 2020-10-08 DIAGNOSIS — K219 Gastro-esophageal reflux disease without esophagitis: Secondary | ICD-10-CM

## 2020-11-09 ENCOUNTER — Ambulatory Visit: Payer: PPO

## 2020-11-12 ENCOUNTER — Encounter: Payer: Self-pay | Admitting: Family Medicine

## 2020-11-12 ENCOUNTER — Other Ambulatory Visit: Payer: Self-pay

## 2020-11-12 ENCOUNTER — Ambulatory Visit (INDEPENDENT_AMBULATORY_CARE_PROVIDER_SITE_OTHER): Payer: PPO | Admitting: Family Medicine

## 2020-11-12 VITALS — BP 134/83 | HR 84 | Temp 98.0°F | Ht 61.75 in | Wt 99.5 lb

## 2020-11-12 DIAGNOSIS — F410 Panic disorder [episodic paroxysmal anxiety] without agoraphobia: Secondary | ICD-10-CM | POA: Diagnosis not present

## 2020-11-12 DIAGNOSIS — Z1211 Encounter for screening for malignant neoplasm of colon: Secondary | ICD-10-CM

## 2020-11-12 DIAGNOSIS — Z23 Encounter for immunization: Secondary | ICD-10-CM

## 2020-11-12 DIAGNOSIS — N1831 Chronic kidney disease, stage 3a: Secondary | ICD-10-CM

## 2020-11-12 DIAGNOSIS — R519 Headache, unspecified: Secondary | ICD-10-CM | POA: Diagnosis not present

## 2020-11-12 DIAGNOSIS — F411 Generalized anxiety disorder: Secondary | ICD-10-CM | POA: Diagnosis not present

## 2020-11-12 DIAGNOSIS — E785 Hyperlipidemia, unspecified: Secondary | ICD-10-CM

## 2020-11-12 DIAGNOSIS — Z1159 Encounter for screening for other viral diseases: Secondary | ICD-10-CM

## 2020-11-12 DIAGNOSIS — Z0000 Encounter for general adult medical examination without abnormal findings: Secondary | ICD-10-CM

## 2020-11-12 DIAGNOSIS — G8929 Other chronic pain: Secondary | ICD-10-CM | POA: Diagnosis not present

## 2020-11-12 MED ORDER — CLORAZEPATE DIPOTASSIUM 3.75 MG PO TABS: 3.7500 mg | ORAL_TABLET | Freq: Every day | ORAL | 1 refills | Status: DC

## 2020-11-12 MED ORDER — TOPIRAMATE 25 MG PO TABS: ORAL_TABLET | ORAL | 2 refills | Status: DC

## 2020-11-12 NOTE — Progress Notes (Signed)
Subjective:   Kristen Caldwell is a 75 y.o. female who presents for Medicare Annual (Subsequent) preventive examination.  Review of Systems    Review of Systems  Constitutional:  Negative for chills and fever.  HENT:  Negative for congestion and sore throat.   Eyes:  Negative for blurred vision and double vision.  Respiratory:  Negative for shortness of breath.   Cardiovascular:  Negative for chest pain.  Gastrointestinal:  Negative for heartburn, nausea and vomiting.  Genitourinary: Negative.   Musculoskeletal: Negative.  Negative for myalgias.  Skin:  Negative for rash.  Neurological:  Negative for dizziness and headaches.  Endo/Heme/Allergies:  Does not bruise/bleed easily.  Psychiatric/Behavioral:  Negative for depression. The patient is not nervous/anxious.    Cardiac Risk Factors include: advanced age (>26mn, >>91women);dyslipidemia     Objective:    Today's Vitals   11/12/20 1400 11/12/20 1411  BP: 134/83   Pulse: 84   Temp: 98 F (36.7 C)   TempSrc: Temporal   SpO2: 99%   Weight: 99 lb 8 oz (45.1 kg)   Height: 5' 1.75" (1.568 m)   PainSc:  3    Body mass index is 18.35 kg/m.  Advanced Directives 11/12/2020 05/12/2020 11/08/2019 11/02/2018  Does Patient Have a Medical Advance Directive? No No No No  Would patient like information on creating a medical advance directive? Yes (MAU/Ambulatory/Procedural Areas - Information given) - No - Patient declined -    Current Medications (verified) Outpatient Encounter Medications as of 11/12/2020  Medication Sig   amitriptyline (ELAVIL) 25 MG tablet Take 1 tablet (25 mg total) by mouth at bedtime as needed. for sleep   Calcium Carbonate-Vit D-Min (CALTRATE 600+D PLUS) 600-400 MG-UNIT per tablet Take 2 tablets by mouth daily.   clorazepate (TRANXENE) 3.75 MG tablet Take 1 tablet (3.75 mg total) by mouth daily.   estradiol (ESTRACE) 0.5 MG tablet Take 1/2 (0.'25mg'$ ) of a pill daily.   Multiple Vitamins-Minerals  (CENTRUM SILVER) CHEW Chew 1 tablet by mouth daily.   Omega-3 Fatty Acids (FISH OIL PO) Take 1 capsule by mouth 2 (two) times daily.   omeprazole (PRILOSEC) 20 MG capsule TAKE 1 CAPSULE(20 MG) BY MOUTH DAILY   ondansetron (ZOFRAN) 8 MG tablet Take 1 tablet (8 mg total) by mouth every 8 (eight) hours as needed for nausea or vomiting.   topiramate (TOPAMAX) 25 MG tablet TAKE 1 TABLET(25 MG) BY MOUTH TWICE DAILY   TURMERIC PO Take by mouth.   vitamin B-12 (CYANOCOBALAMIN) 1000 MCG tablet Take 1,000 mcg by mouth daily.   [DISCONTINUED] Romosozumab-aqqg (EVENITY) 105 MG/1.17ML SOSY injection Inject 210 mg into the skin every 30 (thirty) days. (Patient not taking: Reported on 10/05/2020)   No facility-administered encounter medications on file as of 11/12/2020.    Allergies (verified) Amoxicillin, Cephalexin, Penicillins, Sertraline, and Promethazine hcl   History: Past Medical History:  Diagnosis Date   Anxiety    Chronic headaches    Depression    Diverticulosis    GERD (gastroesophageal reflux disease)    Osteoporosis    Past Surgical History:  Procedure Laterality Date   ABDOMINAL HYSTERECTOMY  03/14/1989   partial   COLONOSCOPY     TUBAL LIGATION     Family History  Problem Relation Age of Onset   Aneurysm Mother    Stroke Father    Hypertension Father    Hypertension Brother    Heart attack Brother    Lupus Sister    Osteoporosis Sister  Diabetes Paternal Grandfather    Breast cancer Paternal Aunt    Colon cancer Neg Hx    Social History   Socioeconomic History   Marital status: Married    Spouse name: Gwyndolyn Saxon   Number of children: 2   Years of education: high school   Highest education level: Not on file  Occupational History   Not on file  Tobacco Use   Smoking status: Never   Smokeless tobacco: Never  Vaping Use   Vaping Use: Never used  Substance and Sexual Activity   Alcohol use: No   Drug use: No   Sexual activity: Not Currently  Other Topics  Concern   Not on file  Social History Narrative   04/20/20   From: the area   Living: with son - Simona Huh and husband is in long term care   Work: part-time 2 days a week, making window treatments      Family: Simona Huh and Shirlean Mylar - 2 grandchildren, and 1 adopted grandchildren       Enjoys: work is fun it is with friends      Exercise: walking, bending at work   Diet: tries to eat healthy      Safety   Seat belts: Yes    Guns: Yes  and secure   Safe in relationships: Yes             Social Determinants of Radio broadcast assistant Strain: Not on file  Food Insecurity: Not on file  Transportation Needs: Not on file  Physical Activity: Not on file  Stress: Not on file  Social Connections: Not on file    Tobacco Counseling Counseling given: Not Answered   Clinical Intake:  Pre-visit preparation completed: No  Pain : 0-10 Pain Score: 3  Pain Type: Chronic pain Pain Location: Neck Pain Onset: More than a month ago Pain Frequency: Occasional Pain Relieving Factors: massage  Pain Relieving Factors: massage  BMI - recorded: 18.35 Nutritional Status: BMI <19  Underweight Nutritional Risks: None Diabetes: No  How often do you need to have someone help you when you read instructions, pamphlets, or other written materials from your doctor or pharmacy?: 1 - Never What is the last grade level you completed in school?: high school  Diabetic?no  Interpreter Needed?: No      Activities of Daily Living In your present state of health, do you have any difficulty performing the following activities: 11/12/2020  Hearing? N  Vision? N  Difficulty concentrating or making decisions? N  Walking or climbing stairs? N  Dressing or bathing? N  Doing errands, shopping? N  Preparing Food and eating ? N  Using the Toilet? N  In the past six months, have you accidently leaked urine? N  Do you have problems with loss of bowel control? N  Managing your Medications? N  Managing  your Finances? N  Housekeeping or managing your Housekeeping? N  Some recent data might be hidden    Patient Care Team: [name], MD as PCP - General (Family Medicine) Renato Shin, MD as Consulting Physician (Endocrinology)  Indicate any recent Medical Services you may have received from other than [facility] providers in the past year (date may be approximate).     Assessment:   This is a routine wellness examination for [place].  Hearing/Vision screen Hearing Screening   '250Hz'$  '500Hz'$  '1000Hz'$  '2000Hz'$  '4000Hz'$   Right ear '20 20 20 20 '$ 0  Left ear '20 20 20 20 '$ 0  Vision  Screening - Comments:: Last eye exam was end of 2021 at [facility] issues and exercise activities discussed: Current Exercise Habits: The patient has a physically strenuous job, but has no regular exercise apart from work., Exercise limited by: None identified   Goals Addressed             This Visit's Progress    Patient Stated       Maintain current active lifestyle including working      Depression Screen PHQ 2/9 Scores 11/08/2019 11/02/2018 07/14/2017 06/29/2016 05/29/2014 05/23/2013 05/22/2012  PHQ - 2 Score 2 0 0 1 0 0 0  PHQ- 9 Score 2 1 - - - - -    Fall Risk Fall Risk  11/12/2020 11/11/2019 11/08/2019 11/02/2018 07/14/2017  Falls in the past year? 0 0 0 0 No  Number falls in past yr: 0 - 0 - -  Injury with Fall? - - 0 - -  Risk for fall due to : - - Medication side effect Medication side effect -  Follow up - - Falls evaluation completed;Falls prevention discussed Falls evaluation completed;Falls prevention discussed -    FALL RISK PREVENTION PERTAINING TO THE HOME:  Any stairs in or around the home? Yes  If so, are there any without handrails? Yes  Home free of loose throw rugs in walkways, pet beds, electrical cords, etc? No  Adequate lighting in your home to reduce risk of falls? Yes   ASSISTIVE DEVICES UTILIZED TO PREVENT FALLS:  Life alert? No  Use of a cane, walker or w/c? No  Grab  bars in the bathroom? Yes  Shower chair or bench in shower? No  Elevated toilet seat or a handicapped toilet? Yes     Cognitive Function: MMSE - Mini Mental State Exam 11/08/2019 11/02/2018  Not completed: Refused -  Orientation to time - 4  Orientation to Place - 5  Registration - 3  Attention/ Calculation - 5  Recall - 3  Language- name 2 objects - 0  Language- repeat - 1  Language- follow 3 step command - 0  Language- read & follow direction - 0  Write a sentence - 0  Copy design - 0  Total score - 21         Mini-Cog - 11/12/20 1420     Normal clock drawing test? yes    How many words correct? 3              Immunizations Immunization History  Administered Date(s) Administered   Fluad Quad(high Dose 65+) 11/09/2018, 12/14/2019, 11/12/2020   Influenza Split 01/31/2011, 12/15/2011   Influenza Whole 12/10/2007, 03/10/2009, 12/08/2009   Influenza,inj,Quad PF,6+ Mos 12/26/2012, 11/07/2013, 02/13/2015, 11/13/2015, 12/14/2017   Influenza-Unspecified 12/19/2016   PFIZER(Purple Top)SARS-COV-2 Vaccination 04/27/2019, 05/18/2019, 08/21/2020   Pneumococcal Conjugate-13 05/29/2014   Pneumococcal Polysaccharide-23 04/25/2011   Td 02/08/2006   Tdap 07/01/2016   Zoster Recombinat (Shingrix) 11/15/2019, 04/10/2020   Zoster, Live 07/30/2009    TDAP status: Up to date  Flu Vaccine status: Up to date  Pneumococcal vaccine status: Up to date  Covid-19 vaccine status: Completed vaccines  Qualifies for Shingles Vaccine? Yes   Zostavax completed Yes   Shingrix Completed?: Yes  Screening Tests Health Maintenance  Topic Date Due   Hepatitis C Screening  Never done   COLONOSCOPY (Pts 45-77yr Insurance coverage will need to be confirmed)  05/11/2020   MAMMOGRAM  09/05/2020   COVID-19 Vaccine (4 - Booster for [employer]) 12/21/2020  TETANUS/TDAP  07/02/2026   INFLUENZA VACCINE  Completed   DEXA SCAN  Completed   PNA vac Low Risk Adult  Completed   Zoster  Vaccines- Shingrix  Completed   HPV VACCINES  Aged Out    Health Maintenance  Health Maintenance Due  Topic Date Due   Hepatitis C Screening  Never done   COLONOSCOPY (Pts 45-74yr Insurance coverage will need to be confirmed)  05/11/2020   MAMMOGRAM  09/05/2020    Colorectal cancer screening: Type of screening: FOBT/FIT. Completed not yet. Repeat every 1 years  Mammogram status: Completed 08/2019. Repeat every year  Bone Density status: Completed 2021. Results reflect: Bone density results: OSTEOPOROSIS. Repeat every 2 years.  Lung Cancer Screening: (Low Dose CT Chest recommended if Age 74-80years, 30 pack-year currently smoking OR have quit w/in 15years.) does not qualify.   Lung Cancer Screening Referral: n/  Additional Screening:  Hepatitis C Screening: does qualify; Completed -   Vision Screening: Recommended annual ophthalmology exams for early detection of glaucoma and other disorders of the eye. Is the patient up to date with their annual eye exam?  No  Who is the provider or what is the name of the office in which the patient attends annual eye exams? [facility] If pt is not established with a provider, would they like to be referred to a provider to establish care?  N/a .   Dental Screening: Recommended annual dental exams for proper oral hygiene  Community Resource Referral / Chronic Care Management: CRR required this visit?  No   CCM required this visit?  No      Plan:     I have personally reviewed and noted the following in the patient's chart:   Medical and social history Use of alcohol, tobacco or illicit drugs  Current medications and supplements including opioid prescriptions.  Functional ability and status Nutritional status Physical activity Advanced directives List of other physicians Hospitalizations, surgeries, and ER visits in previous 12 months Vitals Screenings to include cognitive, depression, and falls Referrals and  appointments  In addition, I have reviewed and discussed with patient certain preventive protocols, quality metrics, and best practice recommendations. A written personalized care plan for preventive services as well as general preventive health recommendations were provided to patient.     JLesleigh Noe MD   11/12/2020

## 2020-11-12 NOTE — Patient Instructions (Signed)
  Kristen Caldwell , Thank you for taking time to come for your Medicare Wellness Visit. I appreciate your ongoing commitment to your health goals. Please review the following plan we discussed and let me know if I can assist you in the future.   These are the goals we discussed:  Goals      Patient Stated     11/02/2018, wants to stay healthy     Patient Stated     11/08/2019, I will maintain and continue medications as prescribed.      Patient Stated     Maintain current active lifestyle including working        This is a list of the screening recommended for you and due dates:  Health Maintenance  Topic Date Due   Hepatitis C Screening: USPSTF Recommendation to screen - Ages 15-79 yo.  Never done   Colon Cancer Screening  05/11/2020   Mammogram  09/05/2020   COVID-19 Vaccine (4 - Booster for [employer] series) 12/21/2020   Tetanus Vaccine  07/02/2026   Flu Shot  Completed   DEXA scan (bone density measurement)  Completed   Pneumonia vaccines  Completed   Zoster (Shingles) Vaccine  Completed   HPV Vaccine  Aged Out

## 2020-11-13 ENCOUNTER — Other Ambulatory Visit (INDEPENDENT_AMBULATORY_CARE_PROVIDER_SITE_OTHER): Payer: PPO

## 2020-11-13 ENCOUNTER — Encounter: Payer: Self-pay | Admitting: Family Medicine

## 2020-11-13 DIAGNOSIS — Z1211 Encounter for screening for malignant neoplasm of colon: Secondary | ICD-10-CM

## 2020-11-13 LAB — COMPREHENSIVE METABOLIC PANEL
ALT: 12 U/L (ref 0–35)
AST: 16 U/L (ref 0–37)
Albumin: 3.9 g/dL (ref 3.5–5.2)
Alkaline Phosphatase: 84 U/L (ref 39–117)
BUN: 20 mg/dL (ref 6–23)
CO2: 27 mEq/L (ref 19–32)
Calcium: 9.2 mg/dL (ref 8.4–10.5)
Chloride: 102 mEq/L (ref 96–112)
Creatinine, Ser: 0.77 mg/dL (ref 0.40–1.20)
GFR: 75.87 mL/min (ref >60.00)
Glucose, Bld: 112 mg/dL — ABNORMAL HIGH (ref 70–99)
Potassium: 3.4 mEq/L — ABNORMAL LOW (ref 3.5–5.1)
Sodium: 136 mEq/L (ref 135–145)
Total Bilirubin: 0.4 mg/dL (ref 0.2–1.2)
Total Protein: 6.8 g/dL (ref 6.0–8.3)

## 2020-11-13 LAB — LIPID PANEL
Cholesterol: 182 mg/dL (ref 0–200)
HDL: 67.4 mg/dL (ref >39.00)
LDL Cholesterol: 102 mg/dL — ABNORMAL HIGH (ref 0–99)
NonHDL: 114.87
Total CHOL/HDL Ratio: 3
Triglycerides: 64 mg/dL (ref 0.0–149.0)
VLDL: 12.8 mg/dL (ref 0.0–40.0)

## 2020-11-13 LAB — FECAL OCCULT BLOOD, IMMUNOCHEMICAL: Fecal Occult Bld: NEGATIVE

## 2020-11-13 LAB — HEPATITIS C ANTIBODY
Hepatitis C Ab: NONREACTIVE
SIGNAL TO CUT-OFF: 0.02 (ref <1.00)

## 2020-11-21 DIAGNOSIS — Z1231 Encounter for screening mammogram for malignant neoplasm of breast: Secondary | ICD-10-CM | POA: Diagnosis not present

## 2020-12-07 DIAGNOSIS — R928 Other abnormal and inconclusive findings on diagnostic imaging of breast: Secondary | ICD-10-CM | POA: Diagnosis not present

## 2020-12-07 LAB — HM MAMMOGRAPHY

## 2020-12-28 ENCOUNTER — Ambulatory Visit: Payer: PPO

## 2020-12-31 DIAGNOSIS — H2513 Age-related nuclear cataract, bilateral: Secondary | ICD-10-CM | POA: Diagnosis not present

## 2021-01-05 ENCOUNTER — Other Ambulatory Visit: Payer: Self-pay | Admitting: Family Medicine

## 2021-01-05 DIAGNOSIS — K219 Gastro-esophageal reflux disease without esophagitis: Secondary | ICD-10-CM

## 2021-01-22 ENCOUNTER — Encounter: Payer: Self-pay | Admitting: Family Medicine

## 2021-01-29 ENCOUNTER — Other Ambulatory Visit: Payer: Self-pay | Admitting: Family Medicine

## 2021-01-29 DIAGNOSIS — F411 Generalized anxiety disorder: Secondary | ICD-10-CM

## 2021-02-01 ENCOUNTER — Ambulatory Visit: Payer: PPO | Admitting: Internal Medicine

## 2021-02-01 ENCOUNTER — Ambulatory Visit: Payer: PPO | Admitting: Endocrinology

## 2021-02-22 ENCOUNTER — Encounter: Payer: Self-pay | Admitting: Family

## 2021-02-22 ENCOUNTER — Ambulatory Visit (INDEPENDENT_AMBULATORY_CARE_PROVIDER_SITE_OTHER): Payer: PPO | Admitting: Family

## 2021-02-22 ENCOUNTER — Other Ambulatory Visit: Payer: Self-pay

## 2021-02-22 ENCOUNTER — Other Ambulatory Visit: Payer: Self-pay | Admitting: Family

## 2021-02-22 VITALS — BP 138/78 | HR 81 | Temp 97.3°F | Ht 61.75 in | Wt 102.0 lb

## 2021-02-22 DIAGNOSIS — E876 Hypokalemia: Secondary | ICD-10-CM

## 2021-02-22 DIAGNOSIS — M7918 Myalgia, other site: Secondary | ICD-10-CM

## 2021-02-22 DIAGNOSIS — M6788 Other specified disorders of synovium and tendon, other site: Secondary | ICD-10-CM | POA: Diagnosis not present

## 2021-02-22 DIAGNOSIS — R739 Hyperglycemia, unspecified: Secondary | ICD-10-CM

## 2021-02-22 LAB — COMPREHENSIVE METABOLIC PANEL
ALT: 13 U/L (ref 0–35)
AST: 17 U/L (ref 0–37)
Albumin: 4.1 g/dL (ref 3.5–5.2)
Alkaline Phosphatase: 75 U/L (ref 39–117)
BUN: 15 mg/dL (ref 6–23)
CO2: 27 mEq/L (ref 19–32)
Calcium: 9.4 mg/dL (ref 8.4–10.5)
Chloride: 104 mEq/L (ref 96–112)
Creatinine, Ser: 0.77 mg/dL (ref 0.40–1.20)
GFR: 75.72 mL/min (ref >60.00)
Glucose, Bld: 118 mg/dL — ABNORMAL HIGH (ref 70–99)
Potassium: 3.7 mEq/L (ref 3.5–5.1)
Sodium: 139 mEq/L (ref 135–145)
Total Bilirubin: 0.4 mg/dL (ref 0.2–1.2)
Total Protein: 7 g/dL (ref 6.0–8.3)

## 2021-02-22 LAB — CK: Total CK: 48 U/L (ref 7–177)

## 2021-02-22 LAB — MAGNESIUM: Magnesium: 1.6 mg/dL (ref 1.5–2.5)

## 2021-02-22 NOTE — Assessment & Plan Note (Signed)
Very mild subtle hyperkalemia at last visit in September.  Labs ordered today pending results.

## 2021-02-22 NOTE — Progress Notes (Signed)
Established Patient Office Visit  Subjective:  Patient ID: Kristen Caldwell, female    DOB: 1945-12-31  Age: 75 y.o. MRN: [id number]  CC:  Chief Complaint  Patient presents with   Arm Pain    HPI Kristen Caldwell is here today with c/o right upper arm muscular pain. Also some in the left upper arm, but right > left. Been going on for over one month. Has been seeing medical massage lady , which helps, but still with pain ongoing.  They feel sore intermittently, not constantly. They feel sore when she is using them. Denies weakness in strength and or grip, just painful. Using voltaren and takes tumeric/curcumin acute pain relief (twice daily) with mild symptom relief. Also some tylenol daily. She denies known injury to her arms. 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 When she tries to put it on the board and hang it up, it is aggravated the most.  Denies any other joint stiffness and or pain in other joints.    Past Medical History:  Diagnosis Date   Anxiety    Chronic headaches    Depression    Diverticulosis    GERD (gastroesophageal reflux disease)    Osteoporosis     Past Surgical History:  Procedure Laterality Date   ABDOMINAL HYSTERECTOMY  03/14/1989   partial   COLONOSCOPY     TUBAL LIGATION      Family History  Problem Relation Age of Onset   Aneurysm Mother    Stroke Father    Hypertension Father    Hypertension Brother    Heart attack Brother    Lupus Sister    Osteoporosis Sister    Diabetes Paternal Grandfather    Breast cancer Paternal Aunt    Colon cancer Neg Hx     Social History   Socioeconomic History   Marital status: Married    Spouse name: Gwyndolyn Saxon   Number of children: 2   Years of education: high school   Highest education level: Not on file  Occupational History   Not on file  Tobacco Use   Smoking status: Never   Smokeless tobacco: Never  Vaping Use   Vaping Use:  Never used  Substance and Sexual Activity   Alcohol use: No   Drug use: No   Sexual activity: Not Currently  Other Topics Concern   Not on file  Social History Narrative   04/20/20   From: the area   Living: with son - Simona Huh and husband is in long term care   Work: part-time 2 days a week, making window treatments      Family: Simona Huh and Shirlean Mylar - 2 grandchildren, and 1 adopted grandchildren       Enjoys: work is fun it is with friends      Exercise: walking, bending at work   Diet: tries to eat healthy      Safety   Seat belts: Yes    Guns: Yes  and secure   Safe in relationships: Yes             Social Determinants of Radio broadcast assistant Strain: Not on file  Food Insecurity: Not on file  Transportation Needs: Not on file  Physical Activity: Not on file  Stress: Not on file  Social Connections: Not on file  Intimate Partner Violence: Not on file    Outpatient Medications Prior to Visit  Medication  Sig Dispense Refill   amitriptyline (ELAVIL) 25 MG tablet TAKE 1 TABLET BY MOUTH AT BEDTIME AS NEEDED FOR SLEEP 90 tablet 1   Calcium Carbonate-Vit D-Min (CALTRATE 600+D PLUS) 600-400 MG-UNIT per tablet Take 2 tablets by mouth daily.     clorazepate (TRANXENE) 3.75 MG tablet Take 1 tablet (3.75 mg total) by mouth daily. 90 tablet 1   estradiol (ESTRACE) 0.5 MG tablet Take 1/2 (0.25mg ) of a pill daily. 90 tablet 0   Multiple Vitamins-Minerals (CENTRUM SILVER) CHEW Chew 1 tablet by mouth daily.     Omega-3 Fatty Acids (FISH OIL PO) Take 1 capsule by mouth 2 (two) times daily.     omeprazole (PRILOSEC) 20 MG capsule TAKE 1 CAPSULE(20 MG) BY MOUTH DAILY 90 capsule 1   ondansetron (ZOFRAN) 8 MG tablet Take 1 tablet (8 mg total) by mouth every 8 (eight) hours as needed for nausea or vomiting. 30 tablet 2   topiramate (TOPAMAX) 25 MG tablet TAKE 1 TABLET(25 MG) BY MOUTH TWICE DAILY 180 tablet 2   TURMERIC PO Take by mouth.     vitamin B-12 (CYANOCOBALAMIN) 1000 MCG tablet  Take 1,000 mcg by mouth daily.     No facility-administered medications prior to visit.    Allergies  Allergen Reactions   Amoxicillin     REACTION: NAUSEA   Cephalexin     REACTION: NAUSEA AND VOMITING   Penicillins     REACTION: TOOK TABLETS WITHOUT PROBLEM   Sertraline Diarrhea   Promethazine Hcl Rash    ROS Review of Systems  Constitutional:  Negative for chills and fever.  Respiratory:  Negative for shortness of breath.   Cardiovascular:  Negative for chest pain and palpitations.  Musculoskeletal:  Positive for myalgias (bil bicep tenderness with movement, dull and achy). Negative for arthralgias (denies shoulder pain), joint swelling and neck pain.  Neurological:  Negative for weakness.     Objective:    Physical Exam Vitals reviewed.  Constitutional:      General: She is not in acute distress.    Appearance: Normal appearance. She is not ill-appearing or toxic-appearing.  HENT:     Head: Normocephalic.  Cardiovascular:     Rate and Rhythm: Normal rate and regular rhythm.  Pulmonary:     Effort: Pulmonary effort is normal.  Musculoskeletal:        General: No swelling, tenderness (no tenderness on palpation bil bicep or UE) or signs of injury.     Right shoulder: No tenderness. Normal range of motion.     Left shoulder: No tenderness. Normal range of motion.     Right upper arm: Normal.     Left upper arm: Normal.     Right elbow: Normal range of motion.     Left elbow: Normal range of motion.     Comments: No pain on ROM bil UE  Neurological:     Mental Status: She is alert.    BP 138/78   Pulse 81   Temp (!) 97.3 F (36.3 C) (Temporal)   Ht 5' 1.75" (1.568 m)   Wt 102 lb (46.3 kg)   SpO2 100%   BMI 18.81 kg/m  Wt Readings from Last 3 Encounters:  02/22/21 102 lb (46.3 kg)  11/12/20 99 lb 8 oz (45.1 kg)  10/05/20 98 lb (44.5 kg)     Health Maintenance Due  Topic Date Due   COLONOSCOPY (Pts 45-69yrs Insurance coverage will need to be  confirmed)  05/11/2020   COVID-19 Vaccine (4 -  Booster for [employer] series) 10/16/2020    There are no preventive care reminders to display for this patient.  Lab Results  Component Value Date   TSH 2.89 07/27/2020   Lab Results  Component Value Date   WBC 6.6 05/12/2020   HGB 14.7 05/12/2020   HCT 43.7 05/12/2020   MCV 91.2 05/12/2020   PLT 180 05/12/2020   Lab Results  Component Value Date   NA 136 11/12/2020   K 3.4 (L) 11/12/2020   CO2 27 11/12/2020   GLUCOSE 112 (H) 11/12/2020   BUN 20 11/12/2020   CREATININE 0.77 11/12/2020   BILITOT 0.4 11/12/2020   ALKPHOS 84 11/12/2020   AST 16 11/12/2020   ALT 12 11/12/2020   PROT 6.8 11/12/2020   ALBUMIN 3.9 11/12/2020   CALCIUM 9.2 11/12/2020   ANIONGAP 11 05/12/2020   GFR 75.87 11/12/2020   No results found for: HGBA1C    Assessment & Plan:   Problem List Items Addressed This Visit       Musculoskeletal and Integument   Biceps tendinosis of both upper extremities    Unable to reproduce pain on physical exam, however patient states this is occurs when she was actively lifting something.  At this time I have recommended physical therapy to evaluate and further treat the complication.  Referral for physical therapy placed, I have advised patient if she does not hear back from the referral team in about 1 week to please let me know.  Handout given to patient on possible exercises that she can follow, advised to continue her over-the-counter supplement with turmeric in it as it has been helping, and utilize Voltaren gel as needed.  Also advised patient to rest the upper extremities when able as this is likely from overuse due to the nature of her occupation.  Labs have been ordered to rule out other etiologies such as elevated creatinine kinase suggestive of possible rhabdomyolysis and/or other muscular diseases.       Relevant Orders   Ambulatory referral to Physical Therapy     Other   Myalgia, upper arm - Primary    Labs  ordered to rule out any other muscular diseases and/or electrolyte deficiencies, although I do believe this is likely related more so to overuse with her occupation.  Pending results      Relevant Orders   CK (Creatine Kinase)   Comprehensive metabolic panel   Ambulatory referral to Physical Therapy   Magnesium   Hypokalemia    Very mild subtle hyperkalemia at last visit in September.  Labs ordered today pending results.       No orders of the defined types were placed in this encounter.   Follow-up: Return in about 3 months (around 05/23/2021) for March, for medication refill.    Eugenia Pancoast, FNP

## 2021-02-22 NOTE — Patient Instructions (Addendum)
Stop by the lab prior to leaving today. I will notify you of your results once received.   A referral was placed today. Please let us know if you have not heard back within 1 week about your referral.  It was a pleasure seeing you today! Please do not hesitate to reach out with any questions and or concerns.  Regards,   Eugenia Pancoast FNP-C

## 2021-02-22 NOTE — Assessment & Plan Note (Signed)
Unable to reproduce pain on physical exam, however patient states this is occurs when she was actively lifting something.  At this time I have recommended physical therapy to evaluate and further treat the complication.  Referral for physical therapy placed, I have advised patient if she does not hear back from the referral team in about 1 week to please let me know.  Handout given to patient on possible exercises that she can follow, advised to continue her over-the-counter supplement with turmeric in it as it has been helping, and utilize Voltaren gel as needed.  Also advised patient to rest the upper extremities when able as this is likely from overuse due to the nature of her occupation.  Labs have been ordered to rule out other etiologies such as elevated creatinine kinase suggestive of possible rhabdomyolysis and/or other muscular diseases.

## 2021-02-22 NOTE — Assessment & Plan Note (Signed)
Labs ordered to rule out any other muscular diseases and/or electrolyte deficiencies, although I do believe this is likely related more so to overuse with her occupation.  Pending results

## 2021-02-23 ENCOUNTER — Telehealth: Payer: Self-pay | Admitting: Family Medicine

## 2021-02-23 NOTE — Chronic Care Management (AMB) (Signed)
°  Chronic Care Management   Note  02/23/2021 Name: Kristen Caldwell MRN: [id number] DOB: [birth year]  Kristen Caldwell is a 75 y.o. year old female who is a primary care patient of [name], MD. I reached out to Kristen Caldwell by phone today in response to a referral sent by Kristen Caldwell's PCP, [name], MD.   Kristen Caldwell was given information about Chronic Care Management services today including:  CCM service includes personalized support from designated clinical staff supervised by her physician, including individualized plan of care and coordination with other care providers 24/7 contact phone numbers for assistance for urgent and routine care needs. Service will only be billed when office clinical staff spend 20 minutes or more in a month to coordinate care. Only one practitioner may furnish and bill the service in a calendar month. The patient may stop CCM services at any time (effective at the end of the month) by phone call to the office staff.   Patient agreed to services and verbal consent obtained.   Follow up plan:   Kristen Caldwell

## 2021-03-03 ENCOUNTER — Encounter: Payer: Self-pay | Admitting: *Deleted

## 2021-03-04 NOTE — Telephone Encounter (Signed)
Pt called stating that she would like her referral sent to somewhere in [place]. Pt states that as long as it is in her network. Please advise.

## 2021-03-09 NOTE — Telephone Encounter (Signed)
Pt called checking on the status of her referral. Please advise.

## 2021-03-10 DIAGNOSIS — H11121 Conjunctival concretions, right eye: Secondary | ICD-10-CM | POA: Diagnosis not present

## 2021-03-22 ENCOUNTER — Telehealth: Payer: Self-pay | Admitting: Family Medicine

## 2021-03-22 NOTE — Telephone Encounter (Signed)
Pt called stating that she had a referral to [facility] in [place]. Pt states that her insurance was with [employer] and at the beginning of the year she changed it to [facility]. Pt states that her new insurance need authorization for the referral to [facility] in [place]. Pt states that her appt is 03/29/2021 and need this done by then. Please advise.

## 2021-03-23 IMAGING — DX DG FINGER THUMB 2+V*R*
3 series · 3 of 3 positions shown · non-contrast
Comparison: None.

CLINICAL DATA: 74-year-old presenting with acute onset of swelling
and erythema involving the RIGHT thumb.

EXAM:
RIGHT THUMB 2+V

[finger ap]
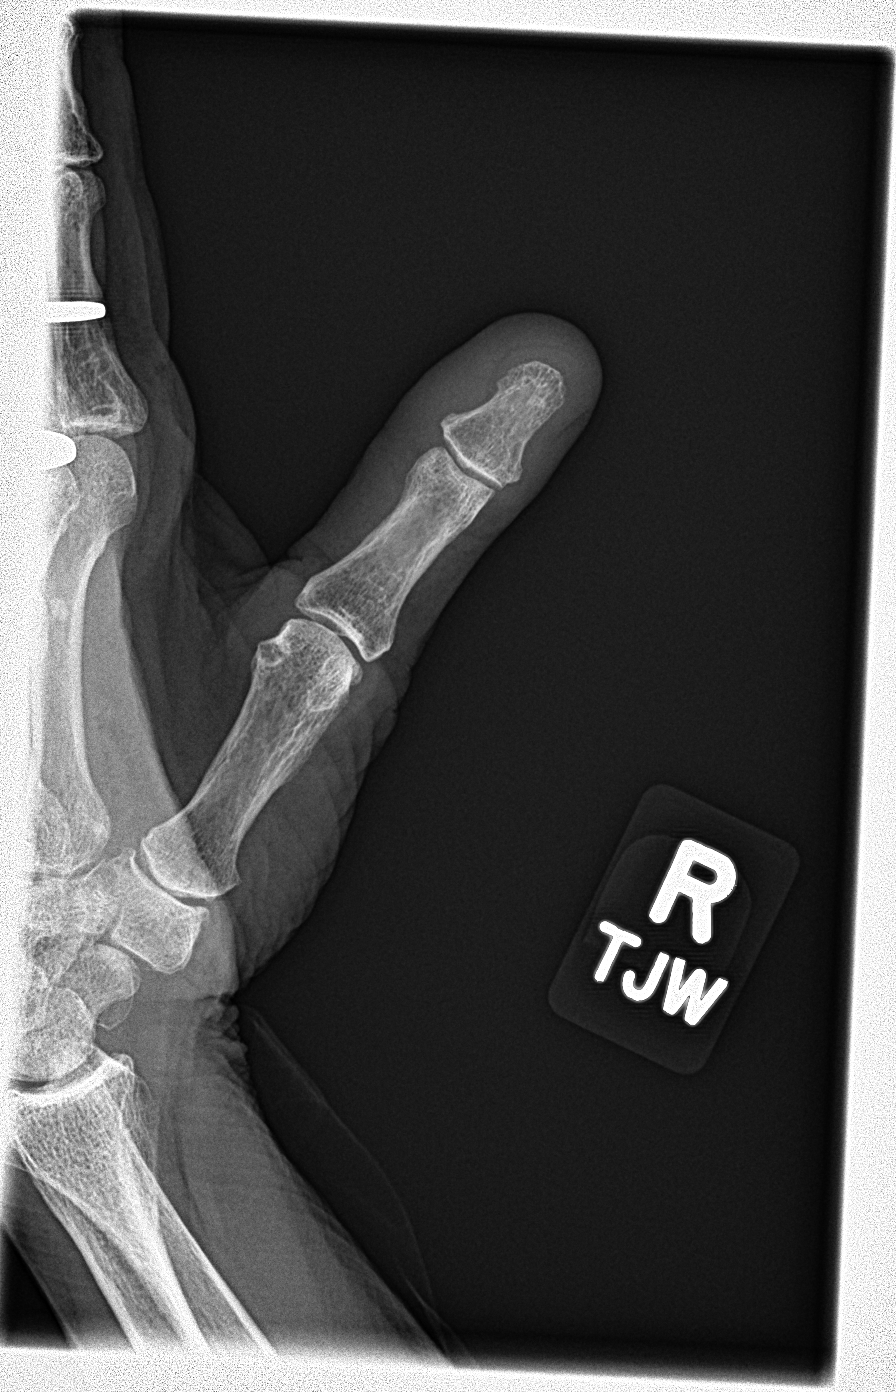

[finger obl]
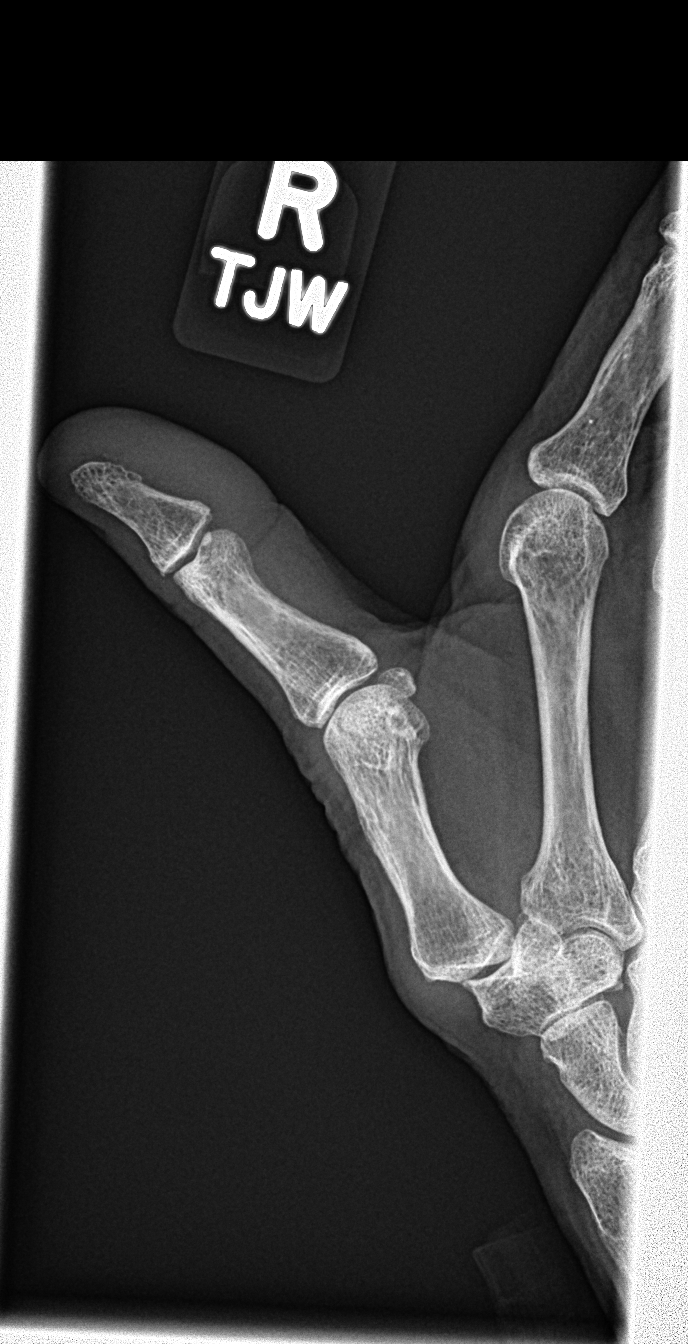

[finger lat]
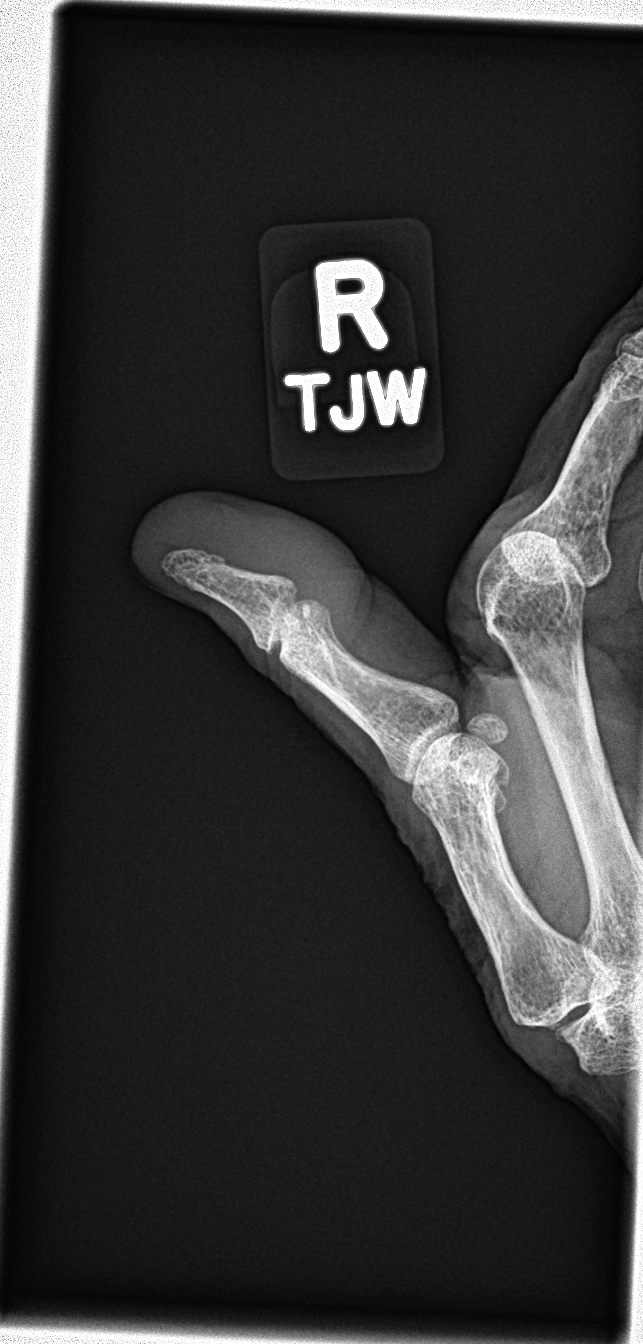

[3 of 3 positions shown; findings below may reference images not displayed]

FINDINGS: Soft tissue swelling distally. No evidence of acute, subacute or
healed fractures. No evidence of osteomyelitis. Moderate narrowing
of the trapezium-first metacarpal joint space. IP joint spaces
well-preserved. Bone mineral density well preserved for patient age.
IMPRESSION: 1. No acute or subacute osseous abnormality. No evidence of
osteomyelitis.
2. Moderate osteoarthritis involving the trapezium-first metacarpal
joint.

## 2021-03-24 NOTE — Telephone Encounter (Signed)
Pt states that her insurance requires authorization for referrals.  The online portal states that no referral is not required - pt states her books states otherwise.  Her new card is not scanned in the system Called the patient to retrieve phone information.  Provider line is [facility] for Auth of Referral to PT Spoke with Kevin Fenton #[id number] States patient plan is HMO Open Access - Plan does not require Referrals OR Auth  Pt is aware of this information

## 2021-03-24 NOTE — Telephone Encounter (Signed)
Responded to patient within [facility]  Per L[facility] her plan does not require a referral  Patient Information [facility] Description [facility] Medicare Advantage Plan 3 (HMO-POS) Date of Birth [birth year] Group Number [id number]  Effective - Term Dates 03/14/2021 - Present  Plan & Provider Verification / Verifying Referral Requirements Patient's Plan Does Not Require a Referral  Patient's Primary Care Provider Waunita Schooner

## 2021-03-29 DIAGNOSIS — M25512 Pain in left shoulder: Secondary | ICD-10-CM | POA: Diagnosis not present

## 2021-03-29 DIAGNOSIS — M25511 Pain in right shoulder: Secondary | ICD-10-CM | POA: Diagnosis not present

## 2021-03-31 ENCOUNTER — Telehealth: Payer: Self-pay

## 2021-03-31 DIAGNOSIS — M25512 Pain in left shoulder: Secondary | ICD-10-CM | POA: Diagnosis not present

## 2021-03-31 DIAGNOSIS — M25511 Pain in right shoulder: Secondary | ICD-10-CM | POA: Diagnosis not present

## 2021-03-31 NOTE — Chronic Care Management (AMB) (Signed)
Chronic Care Management Pharmacy Assistant   Name: Kristen Caldwell  MRN: [id number] DOB: 08-09-1945  Kristen Caldwell is an 76 y.o. year old female who presents for his initial CCM visit with the clinical pharmacist.  Reason for Encounter: Initial Questions   Conditions to be addressed/monitored: HLD and CKD Stage 3   Recent office visits:  02/22/21-Family Medicine-Tabitha Dugal,FNP-Patient presented for right arm pain.Referral for physical therapy, use voltaren gel ,OTC turmeric as needed,labs ordered( normal limits),follow up 3 months  11/12/20-PCP-Jessica Cody,MD-Patient presented for AWV. Labs ordered( abnormal blood sugar, cholesterol slightly high) discussed screenings,vaccines,no medication changes 10/05/20-PCP-Jessica Cody,MD-Patient presented for jaw pain.Recommend dentist for new mouth guard.voltaren gel apply to jaw for pain   Recent consult visits:  12/31/20-Ophthalmology- no data found  Hospital visits:  None in previous 6 months  Medications: Outpatient Encounter Medications as of 03/31/2021  Medication Sig   amitriptyline (ELAVIL) 25 MG tablet TAKE 1 TABLET BY MOUTH AT BEDTIME AS NEEDED FOR SLEEP   Calcium Carbonate-Vit D-Min (CALTRATE 600+D PLUS) 600-400 MG-UNIT per tablet Take 2 tablets by mouth daily.   clorazepate (TRANXENE) 3.75 MG tablet Take 1 tablet (3.75 mg total) by mouth daily.   estradiol (ESTRACE) 0.5 MG tablet Take 1/2 (0.25mg ) of a pill daily.   Multiple Vitamins-Minerals (CENTRUM SILVER) CHEW Chew 1 tablet by mouth daily.   Omega-3 Fatty Acids (FISH OIL PO) Take 1 capsule by mouth 2 (two) times daily.   omeprazole (PRILOSEC) 20 MG capsule TAKE 1 CAPSULE(20 MG) BY MOUTH DAILY   ondansetron (ZOFRAN) 8 MG tablet Take 1 tablet (8 mg total) by mouth every 8 (eight) hours as needed for nausea or vomiting.   topiramate (TOPAMAX) 25 MG tablet TAKE 1 TABLET(25 MG) BY MOUTH TWICE DAILY   TURMERIC PO Take by mouth.   vitamin B-12  (CYANOCOBALAMIN) 1000 MCG tablet Take 1,000 mcg by mouth daily.   No facility-administered encounter medications on file as of 03/31/2021.    Lab Results  Component Value Date/Time   MICROALBUR 1.0 06/09/2008 09:35 AM     BP Readings from Last 3 Encounters:  02/22/21 138/78  11/12/20 134/83  10/05/20 (!) 110/58    Patient contacted to review initial questions prior to visit with Charlene Brooke.  Have you seen any other providers since your last visit with PCP? No  Any changes in your medications or health? No  Any side effects from any medications? No  Do you have an symptoms or problems not managed by your medications? No  Any concerns about your health right now? No  Has your provider asked that you check blood pressure, blood sugar, or follow special diet at home? No  Do you get any type of exercise on a regular basis? Nothing formal.The patient reports  she has to care for husband who is in long term care.  Can you think of a goal you would like to reach for your health? No  Do you have any problems getting your medications? Yes  The patient reports changing from [facility] to [facility]  recently due to medication issue. She had tried to get help due to cost with no success.   Is there anything that you would like to discuss during the appointment? No  The patient reports she is unsure  about the CCM program   Spoke with patient and reminded them to have all medications, supplements and any blood glucose and blood pressure readings available for review with pharmacist, at their telephone visit  on 04/07/21 at 2:00pm.   Star Rating Drugs:  Medication:  Last Fill: Day Supply No star meds identified  Care Gaps: Annual wellness visit in last year? Yes Most Recent BP reading:138/78  81-P   Marjo Bicker CPP notified  Avel Sensor, [place] Assistant [phone]  Total time spent for month CPA: 40 min

## 2021-04-05 DIAGNOSIS — M25511 Pain in right shoulder: Secondary | ICD-10-CM | POA: Diagnosis not present

## 2021-04-05 DIAGNOSIS — M25512 Pain in left shoulder: Secondary | ICD-10-CM | POA: Diagnosis not present

## 2021-04-07 ENCOUNTER — Other Ambulatory Visit: Payer: Self-pay

## 2021-04-07 ENCOUNTER — Ambulatory Visit (INDEPENDENT_AMBULATORY_CARE_PROVIDER_SITE_OTHER): Payer: Medicare Other | Admitting: Pharmacist

## 2021-04-07 DIAGNOSIS — Z79890 Hormone replacement therapy (postmenopausal): Secondary | ICD-10-CM

## 2021-04-07 DIAGNOSIS — M810 Age-related osteoporosis without current pathological fracture: Secondary | ICD-10-CM

## 2021-04-07 DIAGNOSIS — E785 Hyperlipidemia, unspecified: Secondary | ICD-10-CM

## 2021-04-07 DIAGNOSIS — F410 Panic disorder [episodic paroxysmal anxiety] without agoraphobia: Secondary | ICD-10-CM

## 2021-04-07 NOTE — Progress Notes (Signed)
Chronic Care Management Pharmacy Note  04/07/2021 Name:  Kristen Caldwell MRN:  [id number] DOB:  [birth year]  Summary: -Initial CCM visit: pt reports compliance with medication regimen -Pt recently changed insurance plans due to clorazepate copay increase with previous plan; now clorazepate will be $45/month with current plan which is still somewhat high for her  Recommendations/Changes made from today's visit: -Attempt tier exception for clorazepate  Plan: -[place] will call patient with results of tier exception -Pharmacist follow up televisit scheduled for 6 months   Subjective: Kristen Caldwell is an 76 y.o. year old female who is a primary patient of Cody, Jobe Marker, MD.  The CCM team was consulted for assistance with disease management and care coordination needs.    Engaged with patient by telephone for initial visit in response to provider referral for pharmacy case management and/or care coordination services.   Consent to Services:  The patient was given the following information about Chronic Care Management services today, agreed to services, and gave verbal consent: 1. CCM service includes personalized support from designated clinical staff supervised by the primary care provider, including individualized plan of care and coordination with other care providers 2. 24/7 contact phone numbers for assistance for urgent and routine care needs. 3. Service will only be billed when office clinical staff spend 20 minutes or more in a month to coordinate care. 4. Only one practitioner may furnish and bill the service in a calendar month. 5.The patient may stop CCM services at any time (effective at the end of the month) by phone call to the office staff. 6. The patient will be responsible for cost sharing (co-pay) of up to 20% of the service fee (after annual deductible is met). Patient agreed to services and consent obtained.  Patient Care Team: [name], MD as PCP - General (Family Medicine) Renato Shin, MD as Consulting Physician (Endocrinology) Charlton Haws, [facility] as Pharmacist (Pharmacist)  Patient lives at home and works part time for [employer].  Recent office visits: 02/22/21-Family Medicine-Tabitha Dugal,FNP-Patient presented for right arm pain.Referral for physical therapy, use voltaren gel ,OTC turmeric as needed,labs ordered( normal limits),follow up 3 months   11/12/20-PCP-Jessica Cody,MD-Patient presented for AWV. Labs ordered( abnormal blood sugar, cholesterol slightly high) discussed screenings,vaccines,no medication changes  10/05/20-PCP-Jessica Cody,MD-Patient presented for jaw pain.Recommend dentist for new mouth guard.voltaren gel apply to jaw for pain   Recent consult visits: 07/2020 - saw Dr Loanne Drilling (endocrine) for osteoporosis. Ordered Evenity.  Hospital visits: None in previous 6 months   Objective:  Lab Results  Component Value Date   CREATININE 0.77 02/22/2021   BUN 15 02/22/2021   GFR 75.72 02/22/2021   GFRNONAA >60 05/12/2020   GFRAA 88 06/19/2015   NA 139 02/22/2021   K 3.7 02/22/2021   CALCIUM 9.4 02/22/2021   CO2 27 02/22/2021   GLUCOSE 118 (H) 02/22/2021    Lab Results  Component Value Date/Time   GFR 75.72 02/22/2021 10:55 AM   GFR 75.87 11/12/2020 02:42 PM   MICROALBUR 1.0 06/09/2008 09:35 AM    Last diabetic Eye exam: No results found for: HMDIABEYEEXA  Last diabetic Foot exam: No results found for: HMDIABFOOTEX   Lab Results  Component Value Date   CHOL 182 11/12/2020   HDL 67.40 11/12/2020   LDLCALC 102 (H) 11/12/2020   LDLDIRECT 122.5 05/17/2012   TRIG 64.0 11/12/2020   CHOLHDL 3 11/12/2020    Hepatic Function Latest Ref Rng & Units  02/22/2021 11/12/2020 05/12/2020  Total Protein 6.0 - 8.3 g/dL 7.0 6.8 7.3  Albumin 3.5 - 5.2 g/dL 4.1 3.9 3.9  AST 0 - 37 U/L 17 16 20   ALT 0 - 35 U/L 13 12 15   Alk Phosphatase 39 - 117 U/L 75 84 67  Total Bilirubin  0.2 - 1.2 mg/dL 0.4 0.4 0.5  Bilirubin, Direct 0.0 - 0.3 mg/dL - - -    Lab Results  Component Value Date/Time   TSH 2.89 07/27/2020 11:23 AM   TSH 2.51 08/28/2019 02:56 PM   FREET4 0.79 07/27/2020 11:23 AM    CBC Latest Ref Rng & Units 05/12/2020 08/28/2019 07/10/2017  WBC 4.0 - 10.5 K/uL 6.6 6.2 5.5  Hemoglobin 12.0 - 15.0 g/dL 14.7 13.6 14.4  Hematocrit 36.0 - 46.0 % 43.7 40.3 42.4  Platelets 150 - 400 K/uL 180 212.0 173.0    Lab Results  Component Value Date/Time   VD25OH 56.57 07/27/2020 11:23 AM   VD25OH 61.48 08/28/2019 02:56 PM    Clinical ASCVD: No  The 10-year ASCVD risk score (Arnett DK, et al., 2019) is: 18.3%   Values used to calculate the score:     Age: 22 years     Sex: Female     Is Non-Hispanic African American: No     Diabetic: No     Tobacco smoker: No     Systolic Blood Pressure: 956 mmHg     Is BP treated: No     HDL Cholesterol: 67.4 mg/dL     Total Cholesterol: 182 mg/dL    Depression screen [facility] 2/9 11/12/2020 11/08/2019 11/02/2018  Decreased Interest 0 1 0  Down, Depressed, Hopeless 1 1 0  PHQ - 2 Score 1 2 0  Altered sleeping 0 0 1  Tired, decreased energy 1 0 0  Change in appetite 0 0 0  Feeling bad or failure about yourself  0 0 0  Trouble concentrating 0 0 0  Moving slowly or fidgety/restless 0 0 0  Suicidal thoughts 0 0 0  PHQ-9 Score 2 2 1   Difficult doing work/chores Not difficult at all Somewhat difficult Not difficult at all  Some recent data might be hidden    GAD 7 : Generalized Anxiety Score 11/12/2020  Nervous, Anxious, on Edge 1  Control/stop worrying 1  Worry too much - different things 1  Trouble relaxing 0  Easily annoyed or irritable 0  Afraid - awful might happen 0  Anxiety Difficulty Not difficult at all    Social History   Tobacco Use  Smoking Status Never  Smokeless Tobacco Never   BP Readings from Last 3 Encounters:  02/22/21 138/78  11/12/20 134/83  10/05/20 (!) 110/58   Pulse Readings from Last 3  Encounters:  02/22/21 81  11/12/20 84  10/05/20 95   Wt Readings from Last 3 Encounters:  02/22/21 102 lb (46.3 kg)  11/12/20 99 lb 8 oz (45.1 kg)  10/05/20 98 lb (44.5 kg)   BMI Readings from Last 3 Encounters:  02/22/21 18.81 kg/m  11/12/20 18.35 kg/m  10/05/20 17.92 kg/m    Assessment/Interventions: Review of patient past medical history, allergies, medications, health status, including review of consultants reports, laboratory and other test data, was performed as part of comprehensive evaluation and provision of chronic care management services.   SDOH:  (Social Determinants of Health) assessments and interventions performed: Yes SDOH Interventions    Flowsheet Row Most Recent Value  SDOH Interventions   Financial Strain Interventions Other (Comment)  [  tier exception for clorazepate]  Transportation Interventions Intervention Not Indicated      SDOH Screenings   Alcohol Screen: Not on file  Depression (PHQ2-9): Low Risk    PHQ-2 Score: 2  Financial Resource Strain: Medium Risk   Difficulty of Paying Living Expenses: Somewhat hard  Food Insecurity: Not on file  Housing: Not on file  Physical Activity: Not on file  Social Connections: Not on file  Stress: Not on file  Tobacco Use: Low Risk    Smoking Tobacco Use: Never   Smokeless Tobacco Use: Never   Passive Exposure: Not on file  Transportation Needs: No Transportation Needs   Lack of Transportation (Medical): No   Lack of Transportation (Non-Medical): No    CCM Care Plan  Allergies  Allergen Reactions   Amoxicillin     REACTION: NAUSEA   Cephalexin     REACTION: NAUSEA AND VOMITING   Penicillins     REACTION: TOOK TABLETS WITHOUT PROBLEM   Sertraline Diarrhea   Promethazine Hcl Rash    Medications Reviewed Today     Reviewed by Charlton Haws, [facility] (Pharmacist) on 04/07/21 at 1453  Med List Status: <None>   Medication Order Taking? Sig Documenting Provider Last Dose Status Informant   amitriptyline (ELAVIL) 25 MG tablet [id number] Yes TAKE 1 TABLET BY MOUTH AT BEDTIME AS NEEDED FOR SLEEP [name], MD Taking Active   Calcium Carbonate-Vit D-Min (CALTRATE 600+D PLUS) 600-400 MG-UNIT per tablet [id number] Yes Take 2 tablets by mouth daily. [provider] Taking Active   clorazepate (TRANXENE) 3.75 MG tablet [id number] Yes Take 1 tablet (3.75 mg total) by mouth daily. [name], MD Taking Active   estradiol (ESTRACE) 0.5 MG tablet [id number] Yes Take 1/2 (0.1m) of a pill daily. CLesleigh Noe MD Taking Active   Multiple Vitamins-Minerals (CENTRUM S[place] CHEW [id number] Chew 1 tablet by mouth daily. [provider] Taking Active   Omega-3 Fatty Acids (FISH OIL PO) [id number] Take 1 capsule by mouth 2 (two) times daily. [provider] Taking Active   omeprazole (PRILOSEC) 20 MG capsule [id number] TAKE 1 CAPSULE(20 MG) BY MOUTH DAILY CLesleigh Noe MD Taking Active   ondansetron (ZOFRAN) 8 MG tablet [id number] Take 1 tablet (8 mg total) by mouth every 8 (eight) hours as needed for nausea or vomiting. EOrma Render NP Taking Active   topiramate (TOPAMAX) 25 MG tablet [id number] TAKE 1 TABLET(25 MG) BY MOUTH TWICE DAILY CLesleigh Noe MD Taking Active   TURMERIC PO [id number] Take by mouth. [provider] Taking Active   vitamin B-12 (CYANOCOBALAMIN) 1000 MCG tablet [id number] Take 1,000 mcg by mouth daily. 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   Postural dizziness with near syncope 05/20/2020   COVID 03/03/2020   CKD (chronic kidney disease) stage 3, GFR 30-59 ml/min ([place] 01/07/2020   Chronic bilateral low back pain without sciatica 01/07/2020   Dupuytren's contracture of right hand 11/11/2019    Ear noise/buzzing, right 11/11/2019   Postmenopausal HRT (hormone replacement therapy) 05/23/2013   HLD (hyperlipidemia) 06/09/2008   Generalized anxiety disorder with panic attacks 02/21/2007   Depression 02/21/2007   Migraines 02/21/2007   GERD 02/21/2007   Osteoporosis 02/21/2007  CARPAL TUNNEL SYNDROME, HX OF 02/21/2007    Immunization History  Administered Date(s) Administered   Fluad Quad(high Dose 65+) 11/09/2018, 12/14/2019, 11/12/2020   Influenza Split 01/31/2011, 12/15/2011   Influenza Whole 12/10/2007, 03/10/2009, 12/08/2009   Influenza,inj,Quad PF,6+ Mos 12/26/2012, 11/07/2013, 02/13/2015, 11/13/2015, 12/14/2017   Influenza-Unspecified 12/19/2016   PFIZER(Purple Top)SARS-COV-2 Vaccination 04/27/2019, 05/18/2019, 08/21/2020   Pneumococcal Conjugate-13 05/29/2014   Pneumococcal Polysaccharide-23 04/25/2011   Td 02/08/2006   Tdap 07/01/2016   Zoster Recombinat (Shingrix) 11/15/2019, 04/10/2020   Zoster, Live 07/30/2009    Conditions to be addressed/monitored:  Hyperlipidemia, Anxiety, and Osteoporosis  Care Plan : [place]  Updates made by Charlton Haws, RPH since 04/07/2021 12:00 AM     Problem: Hyperlipidemia, Anxiety, and Osteoporosis      Long-Range Goal: Disease mgmt   Start Date: 04/07/2021  Expected End Date: 04/07/2022  This Visit's Progress: On track  Priority: High  Note:   Current Barriers:  Unable to independently afford treatment regimen Unable to independently monitor therapeutic efficacy  Pharmacist Clinical Goal(s):  Patient will verbalize ability to afford treatment regimen achieve adherence to monitoring guidelines and medication adherence to achieve therapeutic efficacy through collaboration with PharmD and provider.   Interventions: 1:1 collaboration with [name], MD regarding development and update of comprehensive plan of care as evidenced by provider attestation and co-signature Inter-disciplinary care  team collaboration (see longitudinal plan of care) Comprehensive medication review performed; medication list updated in electronic medical record  Hyperlipidemia: (LDL goal < 100) -Controlled - LDL near goal without statin -Current treatment: OTC Omega-3 Fish oil  -Educated on Cholesterol goals;  -Recommended to continue current medication  Insomnia / Anxiety (Goal: manage symptoms) -Controlled - pt reports medication is very helpful for sleep and anxiety; she did recently switch insurance plans due to high clorazepate cost with previous plan; her current plan it will be $45 per month; she has been on clorazepate for many years and her previous provider has advised against changing it -PHQ9: 2 (11/2020) - minimal depression -GAD7: 3 (11/2020) - minimal anxiety -Current treatment: Amitriptyline 25 mg HS Clorazepate 3.75 mg daily AM -Medications previously tried/failed: sertraline -Connected with PCP for mental health support -Educated on Benefits of medication for symptom control -Assessed formulary - clorazepate is Tier 3 with [employer], can attempt pt tier exception for lower copay -Recommended to continue current medication  Osteoporosis (Goal prevent fractures) -Not ideally controlled - pt has not been able to tolerate multiple medications and declines further injections (tried to start Evenity last year but ultimately never started due to injection concerns) -Last DEXA Scan: 11/30/19   T-Score femoral neck: -3.9  T-Score total hip: -3.9  T-Score forearm radius: -2.3 -Patient is a candidate for pharmacologic treatment due to T-Score < -2.5 in femoral neck and T-Score < -2.5 in total hip  -Current treatment  Calcium carbonate 600 mg + Vitamin D -Medications previously tried: alendronate, Evista, Prolia (insomnia) -discussed fall precautions to limit risk for fracture -Recommend weight-bearing and muscle strengthening exercises for building and maintaining bone density.   Migraines  (Goal: reduce frequency) -Controlled - pt can't remember last migraine -Current treatment  Amitriptyline 25 mg PRN Topiramate 25 mg BID -Recommended to continue current medication  Postmenopausal HRT (Goal: manage symptoms) -Controlled - pt reports no change in control after reducing estradiol to 1/2 dose -Current treatment  Estradiol 0.5 mg -1/2 tab daily -Discussed importance of using lowest effective dose possible for hormones -Recommended to continue current medication  GERD (  Goal: manage symptoms) -Controlled - pt reports rare breakthrough symptoms as long as she takes PPI -Current treatment  Omeprazole 20 mg daily -Concern for PPI-induced bone density issues; pt has not been able to taper previously -Recommended to continue current medication  Health Maintenance -Vaccine gaps: Covid booster -Current therapy:  Vitamin B12 1000 mg Turmeric  Multivitamin Sambucol Patient is satisfied with current therapy and denies issues -Recommended to continue current medication  Patient Goals/Self-Care Activities Patient will:  - take medications as prescribed as evidenced by patient report and record review focus on medication adherence by pill box collaborate with provider on medication access solutions      Medication Assistance:  Clorazepate - Tier 3 drug, attempting Tier exception.  Compliance/Adherence/Medication fill history: Care Gaps: Colonoscopy (due 05/11/20)  Star-Rating Drugs: None  Patient's preferred pharmacy is:  [facility] [address] [place] - [place] [address] Phone: [phone] Fax: [phone]  Uses pill box? Yes Pt endorses 100% compliance  We discussed: Current pharmacy is preferred with insurance plan and patient is satisfied with pharmacy services Patient decided to: Continue current medication management strategy  Care Plan and Follow Up Patient Decision:  Patient agrees to Care Plan and  Follow-up.  Plan: Telephone follow up appointment with care management team member scheduled for:  6 months  Charlene Brooke, PharmD, BCACP Clinical Pharmacist [facility] [phone]

## 2021-04-07 NOTE — Patient Instructions (Addendum)
Visit Information  Phone number for Pharmacist: [phone]  Thank you for meeting with me to discuss your medications! I look forward to working with you to achieve your health care goals. Below is a summary of what we talked about during the visit:   Goals Addressed             This Visit's Progress    Manage My Medicine       Timeframe:  Long-Range Goal Priority:  Medium Start Date:      04/07/21                       Expected End Date:       04/07/22                Follow Up Date July 2023   - call for medicine refill 2 or 3 days before it runs out - call if I am sick and can't take my medicine - keep a list of all the medicines I take; vitamins and herbals too  -collaborate with provider on medication access solutions (clorazepate)   Why is this important?   These steps will help you keep on track with your medicines.   Notes:         Care Plan : [facility]  Updates made by Charlton Haws, RPH since 04/07/2021 12:00 AM     Problem: Hyperlipidemia, Anxiety, and Osteoporosis      Long-Range Goal: Disease mgmt   Start Date: 04/07/2021  Expected End Date: 04/07/2022  This Visit's Progress: On track  Priority: High  Note:   Current Barriers:  Unable to independently afford treatment regimen Unable to independently monitor therapeutic efficacy  Pharmacist Clinical Goal(s):  Patient will verbalize ability to afford treatment regimen achieve adherence to monitoring guidelines and medication adherence to achieve therapeutic efficacy through collaboration with PharmD and provider.   Interventions: 1:1 collaboration with [name], MD regarding development and update of comprehensive plan of care as evidenced by provider attestation and co-signature Inter-disciplinary care team collaboration (see longitudinal plan of care) Comprehensive medication review performed; medication list updated in electronic medical record  Hyperlipidemia: (LDL  goal < 100) -Controlled - LDL near goal without statin -Current treatment: OTC Omega-3 Fish oil  -Educated on Cholesterol goals;  -Recommended to continue current medication  Insomnia / Anxiety (Goal: manage symptoms) -Controlled - pt reports medication is very helpful for sleep and anxiety; she did recently switch insurance plans due to high clorazepate cost with previous plan; her current plan it will be $45 per month; she has been on clorazepate for many years and her previous provider has advised against changing it -PHQ9: 2 (11/2020) - minimal depression -GAD7: 3 (11/2020) - minimal anxiety -Current treatment: Amitriptyline 25 mg HS Clorazepate 3.75 mg daily AM -Medications previously tried/failed: sertraline -Connected with PCP for mental health support -Educated on Benefits of medication for symptom control -Assessed formulary - clorazepate is Tier 3 with [employer], can attempt pt tier exception for lower copay -Recommended to continue current medication  Osteoporosis (Goal prevent fractures) -Not ideally controlled - pt has not been able to tolerate multiple medications and declines further injections (tried to start Evenity last year but ultimately never started due to injection concerns) -Last DEXA Scan: 11/30/19   T-Score femoral neck: -3.9  T-Score total hip: -3.9  T-Score forearm radius: -2.3 -Patient is a candidate for pharmacologic treatment due to T-Score < -2.5 in femoral neck and T-Score < -  2.5 in total hip  -Current treatment  Calcium carbonate 600 mg + Vitamin D -Medications previously tried: alendronate, Evista, Prolia (insomnia) -discussed fall precautions to limit risk for fracture -Recommend weight-bearing and muscle strengthening exercises for building and maintaining bone density.   Migraines (Goal: reduce frequency) -Controlled - pt can't remember last migraine -Current treatment  Amitriptyline 25 mg PRN Topiramate 25 mg BID -Recommended to continue current  medication  Postmenopausal HRT (Goal: manage symptoms) -Controlled - pt reports no change in control after reducing estradiol to 1/2 dose -Current treatment  Estradiol 0.5 mg -1/2 tab daily -Discussed importance of using lowest effective dose possible for hormones -Recommended to continue current medication  GERD (Goal: manage symptoms) -Controlled - pt reports rare breakthrough symptoms as long as she takes PPI -Current treatment  Omeprazole 20 mg daily -Concern for PPI-induced bone density issues; pt has not been able to taper previously -Recommended to continue current medication  Health Maintenance -Vaccine gaps: Covid booster -Current therapy:  Vitamin B12 1000 mg Turmeric  Multivitamin Sambucol Patient is satisfied with current therapy and denies issues -Recommended to continue current medication  Patient Goals/Self-Care Activities Patient will:  - take medications as prescribed as evidenced by patient report and record review focus on medication adherence by pill box collaborate with provider on medication access solutions      Ms. Gonnella was given information about Chronic Care Management services today including:  CCM service includes personalized support from designated clinical staff supervised by her physician, including individualized plan of care and coordination with other care providers 24/7 contact phone numbers for assistance for urgent and routine care needs. Standard insurance, coinsurance, copays and deductibles apply for chronic care management only during months in which we provide at least 20 minutes of these services. Most insurances cover these services at 100%, however patients may be responsible for any copay, coinsurance and/or deductible if applicable. This service may help you avoid the need for more expensive face-to-face services. Only one practitioner may furnish and bill the service in a calendar month. The patient may stop CCM services at any  time (effective at the end of the month) by phone call to the office staff.  Patient agreed to services and verbal consent obtained.   Patient verbalizes understanding of instructions and care plan provided today and agrees to view in [place]. Active MyChart status confirmed with patient.   Telephone follow up appointment with pharmacy team member scheduled for: 6 months  Charlene Brooke, PharmD, [facility] Clinical Pharmacist [facility] [phone]

## 2021-04-08 DIAGNOSIS — M25512 Pain in left shoulder: Secondary | ICD-10-CM | POA: Diagnosis not present

## 2021-04-08 DIAGNOSIS — M25511 Pain in right shoulder: Secondary | ICD-10-CM | POA: Diagnosis not present

## 2021-04-12 DIAGNOSIS — M25512 Pain in left shoulder: Secondary | ICD-10-CM | POA: Diagnosis not present

## 2021-04-12 DIAGNOSIS — M25511 Pain in right shoulder: Secondary | ICD-10-CM | POA: Diagnosis not present

## 2021-04-13 DIAGNOSIS — M810 Age-related osteoporosis without current pathological fracture: Secondary | ICD-10-CM

## 2021-04-13 DIAGNOSIS — E785 Hyperlipidemia, unspecified: Secondary | ICD-10-CM

## 2021-04-15 DIAGNOSIS — M25511 Pain in right shoulder: Secondary | ICD-10-CM | POA: Diagnosis not present

## 2021-04-15 DIAGNOSIS — M25512 Pain in left shoulder: Secondary | ICD-10-CM | POA: Diagnosis not present

## 2021-04-19 DIAGNOSIS — M25512 Pain in left shoulder: Secondary | ICD-10-CM | POA: Diagnosis not present

## 2021-04-19 DIAGNOSIS — M25511 Pain in right shoulder: Secondary | ICD-10-CM | POA: Diagnosis not present

## 2021-04-22 DIAGNOSIS — M25512 Pain in left shoulder: Secondary | ICD-10-CM | POA: Diagnosis not present

## 2021-04-22 DIAGNOSIS — M25511 Pain in right shoulder: Secondary | ICD-10-CM | POA: Diagnosis not present

## 2021-04-26 DIAGNOSIS — M25511 Pain in right shoulder: Secondary | ICD-10-CM | POA: Diagnosis not present

## 2021-04-26 DIAGNOSIS — M25512 Pain in left shoulder: Secondary | ICD-10-CM | POA: Diagnosis not present

## 2021-05-03 ENCOUNTER — Ambulatory Visit (INDEPENDENT_AMBULATORY_CARE_PROVIDER_SITE_OTHER)
Admission: RE | Admit: 2021-05-03 | Discharge: 2021-05-03 | Disposition: A | Discharge status: Home / Self Care | Payer: Medicare Other | Source: Ambulatory Visit | Attending: Family Medicine | Admitting: Family Medicine

## 2021-05-03 ENCOUNTER — Ambulatory Visit (INDEPENDENT_AMBULATORY_CARE_PROVIDER_SITE_OTHER): Payer: Medicare Other | Admitting: Family Medicine

## 2021-05-03 ENCOUNTER — Other Ambulatory Visit: Payer: Self-pay

## 2021-05-03 VITALS — BP 138/70 | HR 90 | Temp 97.6°F | Ht 61.75 in | Wt 101.0 lb

## 2021-05-03 DIAGNOSIS — S99911A Unspecified injury of right ankle, initial encounter: Secondary | ICD-10-CM | POA: Diagnosis not present

## 2021-05-03 DIAGNOSIS — M25571 Pain in right ankle and joints of right foot: Secondary | ICD-10-CM

## 2021-05-03 DIAGNOSIS — F411 Generalized anxiety disorder: Secondary | ICD-10-CM | POA: Diagnosis not present

## 2021-05-03 DIAGNOSIS — M810 Age-related osteoporosis without current pathological fracture: Secondary | ICD-10-CM | POA: Diagnosis not present

## 2021-05-03 DIAGNOSIS — F410 Panic disorder [episodic paroxysmal anxiety] without agoraphobia: Secondary | ICD-10-CM | POA: Diagnosis not present

## 2021-05-03 MED ORDER — CLORAZEPATE DIPOTASSIUM 3.75 MG PO TABS: 3.7500 mg | ORAL_TABLET | Freq: Every day | ORAL | 1 refills | Status: DC

## 2021-05-03 NOTE — Assessment & Plan Note (Signed)
X-ray without signs of fracture discussed this is likely a sprain.  Recommend ice, rest, elevation, brace and medication as needed.  If pain worsening or not improving over few weeks recommend repeat x-ray and return visit.

## 2021-05-03 NOTE — Assessment & Plan Note (Signed)
Stable on clorazepate.  However patient notes that this is becoming more expensive.  Discussed that she is on a very low-dose of this medication and low suspicion for withdrawal.  However discussed trying half a dose daily to see if worsening anxiety or withdrawal symptoms prior to stopping.  If she does have anxiety and still wants to switch discussed BuSpar as an alternative.  Refill provided return in 6 months or or sooner if wanting to switch to BuSpar

## 2021-05-03 NOTE — Progress Notes (Signed)
Subjective:     Kristen Caldwell is a 76 y.o. female presenting for Ankle Pain (R since yesterday. Pt states she thinks she turned it while putting her slippers on.)     Ankle Pain  The incident occurred 12 to 24 hours ago. The incident occurred at home. The injury mechanism was an inversion injury. The pain is present in the right ankle and right foot. The quality of the pain is described as shooting and stabbing. The pain is at a severity of 3/10. The pain is moderate. Associated symptoms include an inability to bear weight and tingling. Pertinent negatives include no loss of sensation or numbness.   Initially had some pain with ambulation - but rested soon Pain with ambulation Pain overnight Some mild swelling No bruising Pain with movement  #Anxiety - has only ever tried Clorazepate  - has a lot going on with her husband   Review of Systems  Neurological:  Positive for tingling. Negative for numbness.    Social History   Tobacco Use  Smoking Status Never  Smokeless Tobacco Never        Objective:    BP Readings from Last 3 Encounters:  05/03/21 138/70  02/22/21 138/78  11/12/20 134/83   Wt Readings from Last 3 Encounters:  05/03/21 101 lb (45.8 kg)  02/22/21 102 lb (46.3 kg)  11/12/20 99 lb 8 oz (45.1 kg)    BP 138/70    Pulse 90    Temp 97.6 F (36.4 C) (Oral)    Ht 5' 1.75" (1.568 m)    Wt 101 lb (45.8 kg)    SpO2 99%    BMI 18.62 kg/m    Physical Exam Constitutional:      General: She is not in acute distress.    Appearance: She is well-developed. She is not diaphoretic.  HENT:     Right Ear: External ear normal.     Left Ear: External ear normal.     Nose: Nose normal.  Eyes:     Conjunctiva/sclera: Conjunctivae normal.  Cardiovascular:     Rate and Rhythm: Normal rate.  Pulmonary:     Effort: Pulmonary effort is normal.  Musculoskeletal:     Cervical back: Neck supple.     Comments: Right ankle: Mild swelling over the lateral  malleolus.  No bruising.  Does have tenderness along the lower medial and lateral ankle.  Normal ankle strength, normal pulses, range of motion normal but with pain.  Skin:    General: Skin is warm and dry.     Capillary Refill: Capillary refill takes less than 2 seconds.  Neurological:     Mental Status: She is alert. Mental status is at baseline.  Psychiatric:        Mood and Affect: Mood normal.        Behavior: Behavior normal.    DG Ankle Complete Right CLINICAL DATA:  Trauma, pain  EXAM: RIGHT ANKLE - COMPLETE 3+ VIEW  COMPARISON:  None.  FINDINGS: No recent fracture or dislocation is seen. Ankle mortise is unremarkable. There are no opaque foreign bodies.  IMPRESSION: No radiographic abnormality is seen in the right ankle.  Electronically Signed   By: Elmer Picker M.D.   On: 05/03/2021 16:10        Assessment & Plan:   Problem List Items Addressed This Visit       Musculoskeletal and Integument   Osteoporosis   Relevant Orders   DG Ankle Complete Right (Completed)  Other   Generalized anxiety disorder with panic attacks    Stable on clorazepate.  However patient notes that this is becoming more expensive.  Discussed that she is on a very low-dose of this medication and low suspicion for withdrawal.  However discussed trying half a dose daily to see if worsening anxiety or withdrawal symptoms prior to stopping.  If she does have anxiety and still wants to switch discussed BuSpar as an alternative.  Refill provided return in 6 months or or sooner if wanting to switch to BuSpar      Relevant Medications   clorazepate (TRANXENE) 3.75 MG tablet   Acute right ankle pain - Primary    X-ray without signs of fracture discussed this is likely a sprain.  Recommend ice, rest, elevation, brace and medication as needed.  If pain worsening or not improving over few weeks recommend repeat x-ray and return visit.      Relevant Orders   DG Ankle Complete Right  (Completed)     Return in about 6 months (around 10/31/2021) for medication.  [name], MD  This visit occurred during the SARS-CoV-2 public health emergency.  Safety protocols were in place, including screening questions prior to the visit, additional usage of staff PPE, and extensive cleaning of exam room while observing appropriate contact time as indicated for disinfecting solutions.

## 2021-05-03 NOTE — Patient Instructions (Addendum)
Ankle sprain - take it easy - rest  - weight bearing as tolerated - get an ankle brace to use as needed - tylenol as needed - return if worsening or not improving over the next 2-3 weeks  Anxiety medications Step 1: Just don't take medicine  -- If anxiety Step 2: Take 1/2 pill -- If anxiety  Withdrawal symptom Tremors ?Anxiety ?Perceptual disturbances ?Dysphoria ?Psychosis ?Seizures ?Autonomic instability   Update - Option 1: continue regimen - Option 2: Could try taking Buspar instead (see below)   Buspirone Tablets What is this medication? BUSPIRONE (byoo SPYE rone) treats anxiety. It works by balancing the levels of dopamine and serotonin in your brain, hormones that help regulate mood. This medicine may be used for other purposes; ask your health care provider or pharmacist if you have questions. COMMON BRAND NAME(S): BuSpar, Buspar Dividose What should I tell my care team before I take this medication? They need to know if you have any of these conditions: Kidney or liver disease An unusual or allergic reaction to buspirone, other medications, foods, dyes, or preservatives Pregnant or trying to get pregnant Breast-feeding How should I use this medication? Take this medication by mouth with a glass of water. Follow the directions on the prescription label. You may take this medication with or without food. To ensure that this medication always works the same way for you, you should take it either always with or always without food. Take your doses at regular intervals. Do not take your medication more often than directed. Do not stop taking except on the advice of your care team. Talk to your care team about the use of this medication in children. Special care may be needed. Overdosage: If you think you have taken too much of this medicine contact a poison control center or emergency room at once. NOTE: This medicine is only for you. Do not share this medicine with  others. What if I miss a dose? If you miss a dose, take it as soon as you can. If it is almost time for your next dose, take only that dose. Do not take double or extra doses. What may interact with this medication? Do not take this medication with any of the following: Linezolid MAOIs like Carbex, Eldepryl, Marplan, Nardil, and Parnate Methylene blue Procarbazine This medication may also interact with the following: Diazepam Digoxin Diltiazem Erythromycin Grapefruit juice Haloperidol Medications for mental depression or mood problems Medications for seizures like carbamazepine, phenobarbital and phenytoin Nefazodone Other medications for anxiety Rifampin Ritonavir Some antifungal medications like itraconazole, ketoconazole, and voriconazole Verapamil Warfarin This list may not describe all possible interactions. Give your health care provider a list of all the medicines, herbs, non-prescription drugs, or dietary supplements you use. Also tell them if you smoke, drink alcohol, or use illegal drugs. Some items may interact with your medicine. What should I watch for while using this medication? Visit your care team for regular checks on your progress. It may take 1 to 2 weeks before your anxiety gets better. You may get drowsy or dizzy. Do not drive, use machinery, or do anything that needs mental alertness until you know how this medication affects you. Do not stand or sit up quickly, especially if you are an older patient. This reduces the risk of dizzy or fainting spells. Alcohol can make you more drowsy and dizzy. Avoid alcoholic drinks. What side effects may I notice from receiving this medication? Side effects that you should report to your care  team as soon as possible: Allergic reactions--skin rash, itching, hives, swelling of the face, lips, tongue, or throat Irritability, confusion, fast or irregular heartbeat, muscle stiffness, twitching muscles, sweating, high fever,  seizure, chills, vomiting, diarrhea, which may be signs of serotonin syndrome Side effects that usually do not require medical attention (report to your care team if they continue or are bothersome): Anxiety or nervousness Dizziness Drowsiness Headache Nausea Trouble sleeping This list may not describe all possible side effects. Call your doctor for medical advice about side effects. You may report side effects to FDA at 1-800-FDA-1088. Where should I keep my medication? Keep out of the reach of children. Store at room temperature below 30 degrees C (86 degrees F). Protect from light. Keep container tightly closed. Throw away any unused medication after the expiration date. NOTE: This sheet is a summary. It may not cover all possible information. If you have questions about this medicine, talk to your doctor, pharmacist, or health care provider.  2022 Elsevier/Gold Standard (2020-05-28 00:00:00)

## 2021-05-10 DIAGNOSIS — M25511 Pain in right shoulder: Secondary | ICD-10-CM | POA: Diagnosis not present

## 2021-05-10 DIAGNOSIS — M25512 Pain in left shoulder: Secondary | ICD-10-CM | POA: Diagnosis not present

## 2021-05-17 DIAGNOSIS — M25511 Pain in right shoulder: Secondary | ICD-10-CM | POA: Diagnosis not present

## 2021-05-17 DIAGNOSIS — M25512 Pain in left shoulder: Secondary | ICD-10-CM | POA: Diagnosis not present

## 2021-05-24 ENCOUNTER — Ambulatory Visit: Payer: PPO | Admitting: Family Medicine

## 2021-05-24 DIAGNOSIS — M25511 Pain in right shoulder: Secondary | ICD-10-CM | POA: Diagnosis not present

## 2021-05-24 DIAGNOSIS — M25512 Pain in left shoulder: Secondary | ICD-10-CM | POA: Diagnosis not present

## 2021-06-12 DIAGNOSIS — R110 Nausea: Secondary | ICD-10-CM | POA: Diagnosis not present

## 2021-06-12 DIAGNOSIS — R82998 Other abnormal findings in urine: Secondary | ICD-10-CM | POA: Diagnosis not present

## 2021-06-12 DIAGNOSIS — R42 Dizziness and giddiness: Secondary | ICD-10-CM | POA: Diagnosis not present

## 2021-06-14 ENCOUNTER — Telehealth: Payer: Self-pay | Admitting: Family Medicine

## 2021-06-14 DIAGNOSIS — R110 Nausea: Secondary | ICD-10-CM

## 2021-06-14 DIAGNOSIS — U071 COVID-19: Secondary | ICD-10-CM

## 2021-06-14 MED ORDER — ONDANSETRON HCL 8 MG PO TABS: 8.0000 mg | ORAL_TABLET | Freq: Three times a day (TID) | ORAL | 2 refills | Status: DC | PRN

## 2021-06-14 NOTE — Telephone Encounter (Signed)
Pt called  to know status of medication refill ?

## 2021-06-14 NOTE — Addendum Note (Signed)
Addended by: Loreen Freud on: 06/14/2021 03:35 PM ? ? Modules accepted: Orders ? ?

## 2021-06-14 NOTE — Telephone Encounter (Signed)
?  Encourage patient to contact the pharmacy for refills or they can request refills through [facility] ? ?LAST APPOINTMENT DATE:  Please schedule appointment if longer than 1 year ? ?NEXT APPOINTMENT DATE: ? ?MEDICATION:ondansetron (ZOFRAN) 8 MG tablet ? ?Is the patient out of medication?  ? ?[place], [place] ? ?Let patient know to contact pharmacy at the end of the day to make sure medication is ready. ? ?Please notify patient to allow 48-72 hours to process ? ?CLINICAL FILLS OUT ALL BELOW:  ? ?LAST REFILL: ? ?QTY: ? ?REFILL DATE: ? ? ? ?OTHER COMMENTS:  ? ? ?Okay for refill? ? ?Please advise ? ? ?  ?

## 2021-06-14 NOTE — Telephone Encounter (Signed)
Rx sent to pharmacy   

## 2021-06-16 ENCOUNTER — Encounter: Payer: Self-pay | Admitting: Family Medicine

## 2021-06-16 ENCOUNTER — Ambulatory Visit (INDEPENDENT_AMBULATORY_CARE_PROVIDER_SITE_OTHER): Payer: Medicare Other | Admitting: Family Medicine

## 2021-06-16 VITALS — BP 120/60 | HR 80 | Temp 97.6°F | Ht 61.75 in | Wt 103.3 lb

## 2021-06-16 DIAGNOSIS — R42 Dizziness and giddiness: Secondary | ICD-10-CM | POA: Insufficient documentation

## 2021-06-16 MED ORDER — MECLIZINE HCL 12.5 MG PO TABS: 12.5000 mg | ORAL_TABLET | Freq: Three times a day (TID) | ORAL | 0 refills | Status: AC | PRN

## 2021-06-16 NOTE — Progress Notes (Signed)
? ?Subjective:  ? ?  ?Kristen Caldwell is a 76 y.o. female presenting for Nausea (Zofran not helping), Dizziness (When bending over ), Ear Fullness (L feels "funny" ), and Headache (Pressure, especially in the back of the head. ) ?  ? ? ?HPI ? ?#Dizziness ?- started on Friday AM - bending over to put on clothes and the room started spinning ?- ate something - but then started feeling nauseous ?- went to see her husband and went to the bathroom and got dizzy again ?- Saturday - severe nausea - went to acute care [facility] clinic -  ?- also with HA and ear feeling "funny" ?- has been taking zofran ?- went to [facility] - normal blood work, negative urine ?- presyncopal symptoms ?- tries not to turn over in bed - has been elevating her head  ? ? ?Nausea is constant ?No vomiting ?In the past had some nausea with covid treatment ?Not associated with dizziness ?Was fine yesterday - until a smell of  ? ?Zofran with intermittent help with nausea ? ?Hydration - 2 bottles of water with electrolytes (16 oz), full glass of decaf tea, 1 cup of coffee  ? ?Has constipation - has not had a good BM in a few days ? ? ?Review of Systems ? ? ?Social History  ? ?Tobacco Use  ?Smoking Status Never  ?Smokeless Tobacco Never  ? ? ? ?   ?Objective:  ?  ?BP Readings from Last 3 Encounters:  ?06/16/21 120/60  ?05/03/21 138/70  ?02/22/21 138/78  ? ?Wt Readings from Last 3 Encounters:  ?06/16/21 103 lb 5 oz (46.9 kg)  ?05/03/21 101 lb (45.8 kg)  ?02/22/21 102 lb (46.3 kg)  ? ? ?BP 120/60   Pulse 80   Temp 97.6 ?F (36.4 ?C) (Oral)   Ht 5' 1.75" (1.568 m)   Wt 103 lb 5 oz (46.9 kg)   SpO2 100%   BMI 19.05 kg/m?  ? ? ?Physical Exam ?Constitutional:   ?   General: She is not in acute distress. ?   Appearance: She is well-developed. She is not diaphoretic.  ?HENT:  ?   Right Ear: External ear normal.  ?   Left Ear: External ear normal.  ?   Nose: Nose normal.  ?Eyes:  ?   Conjunctiva/sclera: Conjunctivae normal.  ?Cardiovascular:  ?   Rate  and Rhythm: Normal rate.  ?   Heart sounds: No murmur heard. ?Pulmonary:  ?   Effort: Pulmonary effort is normal. No respiratory distress.  ?   Breath sounds: Normal breath sounds.  ?Abdominal:  ?   General: Bowel sounds are normal. There is no distension.  ?   Palpations: Abdomen is soft.  ?   Tenderness: There is no abdominal tenderness. There is no guarding.  ?Musculoskeletal:  ?   Cervical back: Neck supple.  ?Skin: ?   General: Skin is warm and dry.  ?   Capillary Refill: Capillary refill takes less than 2 seconds.  ?Neurological:  ?   Mental Status: She is alert. Mental status is at baseline.  ?Psychiatric:     ?   Mood and Affect: Mood normal.     ?   Behavior: Behavior normal.  ? ? ? ? ? ?   ?Assessment & Plan:  ? ?Problem List Items Addressed This Visit   ? ?  ? Other  ? Dizziness - Primary  ?  Chronic and recurrent symptom. C/b nausea and headache. Without other signs of  illness. Reviewed outside work up including normal EKG, CMP, CBC, urine culture, negative orthostatic VS. Etiology unclear - may still be postural dizziness w/o orthostasis but also concerning for possible vertigo with some spinning etiology as well as nystagmus on exam. Trial of meclizine to see if symptoms improve. If no improvement anticipate possible ENT referral. Also encouraged hydration ?  ?  ? Relevant Medications  ? meclizine (ANTIVERT) 12.5 MG tablet  ? ? ? ?Return if symptoms worsen or fail to improve. ? ?[name], MD ? ?This visit occurred during the SARS-CoV-2 public health emergency.  Safety protocols were in place, including screening questions prior to the visit, additional usage of staff PPE, and extensive cleaning of exam room while observing appropriate contact time as indicated for disinfecting solutions.  ? ?

## 2021-06-16 NOTE — Assessment & Plan Note (Addendum)
Chronic and recurrent symptom. C/b nausea and headache. Without other signs of illness. Reviewed outside work up including normal EKG, CMP, CBC, urine culture, negative orthostatic VS. Etiology unclear - may still be postural dizziness w/o orthostasis but also concerning for possible vertigo with some spinning etiology as well as nystagmus on exam. Trial of meclizine to see if symptoms improve. If no improvement anticipate possible ENT referral. Also encouraged hydration ?

## 2021-06-16 NOTE — Patient Instructions (Addendum)
Dizziness ?- not sure if this is inner ear ?- 12.5 mg up to 3 times per day ?- can try 2 pills if not effective ?- Update on Monday if not any better - and can plan for ENT or physical therapy ? ?Try to drink 50 oz of water or decaf beverages per day ? ?Bland diet - get food intake ? ?Constipation  ?- take fiber supplement to get your bowels moving ?

## 2021-06-29 ENCOUNTER — Encounter: Payer: Self-pay | Admitting: Family Medicine

## 2021-06-29 ENCOUNTER — Ambulatory Visit (INDEPENDENT_AMBULATORY_CARE_PROVIDER_SITE_OTHER): Payer: Medicare Other | Admitting: Family Medicine

## 2021-06-29 VITALS — BP 120/70 | HR 86 | Temp 97.7°F | Ht 61.75 in | Wt 103.2 lb

## 2021-06-29 DIAGNOSIS — T3695XA Adverse effect of unspecified systemic antibiotic, initial encounter: Secondary | ICD-10-CM

## 2021-06-29 DIAGNOSIS — N3000 Acute cystitis without hematuria: Secondary | ICD-10-CM | POA: Diagnosis not present

## 2021-06-29 DIAGNOSIS — Z79890 Hormone replacement therapy (postmenopausal): Secondary | ICD-10-CM

## 2021-06-29 DIAGNOSIS — B379 Candidiasis, unspecified: Secondary | ICD-10-CM | POA: Diagnosis not present

## 2021-06-29 LAB — POC URINALSYSI DIPSTICK (AUTOMATED)
Bilirubin, UA: NEGATIVE
Blood, UA: NEGATIVE
Glucose, UA: NEGATIVE
Ketones, UA: NEGATIVE
Nitrite, UA: NEGATIVE
Protein, UA: NEGATIVE
Spec Grav, UA: 1.01 (ref 1.010–1.025)
Urobilinogen, UA: 0.2 E.U./dL
pH, UA: 6.5 (ref 5.0–8.0)

## 2021-06-29 MED ORDER — ESTRADIOL 0.5 MG PO TABS: ORAL_TABLET | ORAL | 1 refills | Status: DC

## 2021-06-29 MED ORDER — CEFUROXIME AXETIL 250 MG PO TABS: 250.0000 mg | ORAL_TABLET | Freq: Two times a day (BID) | ORAL | 0 refills | Status: AC

## 2021-06-29 MED ORDER — FLUCONAZOLE 150 MG PO TABS: 150.0000 mg | ORAL_TABLET | Freq: Once | ORAL | 0 refills | Status: AC

## 2021-06-29 NOTE — Progress Notes (Signed)
? ?Subjective:  ? ?  ?Natarsha Hurwitz is a 76 y.o. female presenting for Dysuria (Just completed abo on 06/23/21 for UTI. Sx started back on 06/26/21) and Dizziness ?  ? ? ?Dysuria  ? ?Dizziness ? ? ?#UTI ?- 4/1 - went to UC and was called on 4/5 ?- started abx for this ?- the dizzy symptoms improved immediately ?- was put on doxycyclin for 7 days  ?- completed this on 4/12 ?- on Saturday 4/15 - started having burning and lightheaded symptoms again ?- also started thinking she might be having a yeast infection - took her fluconazole ?- still having burning w/ urination  ?- frequency ?- drinking a lot of water ?-  ? ? ?Review of Systems  ?Genitourinary:  Positive for dysuria.  ?Neurological:  Positive for dizziness.  ? ? ?Social History  ? ?Tobacco Use  ?Smoking Status Never  ?Smokeless Tobacco Never  ? ? ? ?   ?Objective:  ?  ?BP Readings from Last 3 Encounters:  ?06/29/21 120/70  ?06/16/21 120/60  ?05/03/21 138/70  ? ?Wt Readings from Last 3 Encounters:  ?06/29/21 103 lb 3 oz (46.8 kg)  ?06/16/21 103 lb 5 oz (46.9 kg)  ?05/03/21 101 lb (45.8 kg)  ? ? ?BP 120/70   Pulse 86   Temp 97.7 ?F (36.5 ?C) (Oral)   Ht 5' 1.75" (1.568 m)   Wt 103 lb 3 oz (46.8 kg)   SpO2 94%   BMI 19.03 kg/m?  ? ? ?Physical Exam ?Constitutional:   ?   General: She is not in acute distress. ?   Appearance: She is well-developed. She is not diaphoretic.  ?HENT:  ?   Head: Normocephalic and atraumatic.  ?   Right Ear: External ear normal.  ?   Left Ear: External ear normal.  ?   Nose: Nose normal.  ?Eyes:  ?   Conjunctiva/sclera: Conjunctivae normal.  ?Cardiovascular:  ?   Rate and Rhythm: Normal rate and regular rhythm.  ?Pulmonary:  ?   Effort: Pulmonary effort is normal. No respiratory distress.  ?   Breath sounds: Normal breath sounds. No wheezing.  ?Abdominal:  ?   General: Bowel sounds are normal. There is no distension.  ?   Palpations: Abdomen is soft.  ?   Tenderness: There is no abdominal tenderness. There is no right  CVA tenderness, left CVA tenderness or guarding.  ?Musculoskeletal:  ?   Cervical back: Neck supple.  ?Skin: ?   General: Skin is warm and dry.  ?   Capillary Refill: Capillary refill takes less than 2 seconds.  ?Neurological:  ?   Mental Status: She is alert. Mental status is at baseline.  ?Psychiatric:     ?   Mood and Affect: Mood normal.     ?   Behavior: Behavior normal.  ? ?UA: +LE, neg nitrites ? ?Care everywhere urine - Group B strep ? ?   ?Assessment & Plan:  ? ?Problem List Items Addressed This Visit   ? ?  ? Other  ? Postmenopausal HRT (hormone replacement therapy)  ?  Patient was recently reduced from 0.5 to 0.25 mg daily and has been tolerating well.  Discussed going to 6 days/week and decreasing as tolerated. ? ?  ?  ? Relevant Medications  ? estradiol (ESTRACE) 0.5 MG tablet  ? ?Other Visit Diagnoses   ? ? Acute cystitis without hematuria    -  Primary  ? Relevant Medications  ? cefUROXime (CEFTIN) 250  MG tablet  ? Other Relevant Orders  ? POCT Urinalysis Dipstick (Automated) (Completed)  ? Urine Culture  ? Antibiotic-induced yeast infection      ? Relevant Medications  ? cefUROXime (CEFTIN) 250 MG tablet  ? fluconazole (DIFLUCAN) 150 MG tablet  ? ?  ? ?Possible recurrent UTI, outside urine culture with group B strep.  Of note previous positive culture in our system was also group B strep.  Though both less than 100,000 colony-forming units. ? ?He did respond to antibiotics so given her return of symptoms will send in Ceftin.  She is gets nausea with Keflex.  Will update if side effects to add this to the allergy list, though she was prescribed previously. ? ?Follow-up culture ? ?Return if symptoms worsen or fail to improve. ? ?[name], MD ? ? ? ?

## 2021-06-29 NOTE — Patient Instructions (Addendum)
Hormones  ?- try to reduce to 6 days a week ?- if tolerating after 2-4 weeks, decrease to 5 days week ? ?Urine infection ?- try antibiotic ?- will send for culture ?- update if no improvement in symptoms with antibiotics or if side effects ?- ok to take zofran if needed for nause ?

## 2021-06-29 NOTE — Assessment & Plan Note (Signed)
Patient was recently reduced from 0.5 to 0.25 mg daily and has been tolerating well.  Discussed going to 6 days/week and decreasing as tolerated. ?

## 2021-07-01 LAB — URINE CULTURE
MICRO NUMBER:: 13278688
SPECIMEN QUALITY:: ADEQUATE

## 2021-07-05 ENCOUNTER — Telehealth: Payer: Self-pay | Admitting: Family Medicine

## 2021-07-05 NOTE — Telephone Encounter (Signed)
Pt called stating she has a UTI and the medication she is taking is making her feel worse. Wants to speak more with the nurse about this. She does not want a medication refill.  ? ?Medication: cefUROXime (CEFTIN) 250 MG tablet. ?

## 2021-07-05 NOTE — Telephone Encounter (Signed)
Agree with recommendations and plan

## 2021-07-05 NOTE — Telephone Encounter (Signed)
Pt states that she gets nauseas and dizzy after she takes the medicine, even though she takes it with food and fluid. Pt states that she only has one more days worth and will take them with the zofran to help.  ?

## 2021-07-10 ENCOUNTER — Other Ambulatory Visit: Payer: Self-pay | Admitting: Family Medicine

## 2021-07-10 DIAGNOSIS — K219 Gastro-esophageal reflux disease without esophagitis: Secondary | ICD-10-CM

## 2021-07-19 ENCOUNTER — Ambulatory Visit (INDEPENDENT_AMBULATORY_CARE_PROVIDER_SITE_OTHER): Payer: Medicare Other | Admitting: Family Medicine

## 2021-07-19 VITALS — BP 110/60 | HR 83 | Temp 97.5°F | Wt 101.4 lb

## 2021-07-19 DIAGNOSIS — F411 Generalized anxiety disorder: Secondary | ICD-10-CM | POA: Diagnosis not present

## 2021-07-19 DIAGNOSIS — R519 Headache, unspecified: Secondary | ICD-10-CM | POA: Diagnosis not present

## 2021-07-19 DIAGNOSIS — G8929 Other chronic pain: Secondary | ICD-10-CM | POA: Insufficient documentation

## 2021-07-19 DIAGNOSIS — N3000 Acute cystitis without hematuria: Secondary | ICD-10-CM | POA: Diagnosis not present

## 2021-07-19 DIAGNOSIS — R7309 Other abnormal glucose: Secondary | ICD-10-CM | POA: Diagnosis not present

## 2021-07-19 DIAGNOSIS — N390 Urinary tract infection, site not specified: Secondary | ICD-10-CM | POA: Diagnosis not present

## 2021-07-19 DIAGNOSIS — R300 Dysuria: Secondary | ICD-10-CM

## 2021-07-19 DIAGNOSIS — R5383 Other fatigue: Secondary | ICD-10-CM | POA: Diagnosis not present

## 2021-07-19 DIAGNOSIS — B951 Streptococcus, group B, as the cause of diseases classified elsewhere: Secondary | ICD-10-CM | POA: Diagnosis not present

## 2021-07-19 LAB — POC URINALSYSI DIPSTICK (AUTOMATED)
Bilirubin, UA: NEGATIVE
Blood, UA: NEGATIVE
Glucose, UA: NEGATIVE
Ketones, UA: NEGATIVE
Nitrite, UA: NEGATIVE
Protein, UA: NEGATIVE
Spec Grav, UA: 1.01 (ref 1.010–1.025)
Urobilinogen, UA: 0.2 E.U./dL
pH, UA: 7 (ref 5.0–8.0)

## 2021-07-19 LAB — COMPREHENSIVE METABOLIC PANEL
ALT: 13 U/L (ref 0–35)
AST: 19 U/L (ref 0–37)
Albumin: 4.3 g/dL (ref 3.5–5.2)
Alkaline Phosphatase: 63 U/L (ref 39–117)
BUN: 12 mg/dL (ref 6–23)
CO2: 27 mEq/L (ref 19–32)
Calcium: 9.9 mg/dL (ref 8.4–10.5)
Chloride: 101 mEq/L (ref 96–112)
Creatinine, Ser: 0.85 mg/dL (ref 0.40–1.20)
GFR: 67.06 mL/min (ref >60.00)
Glucose, Bld: 94 mg/dL (ref 70–99)
Potassium: 4.8 mEq/L (ref 3.5–5.1)
Sodium: 134 mEq/L — ABNORMAL LOW (ref 135–145)
Total Bilirubin: 0.4 mg/dL (ref 0.2–1.2)
Total Protein: 7.6 g/dL (ref 6.0–8.3)

## 2021-07-19 LAB — CBC
HCT: 44 % (ref 36.0–46.0)
Hemoglobin: 14.4 g/dL (ref 12.0–15.0)
MCHC: 32.8 g/dL (ref 30.0–36.0)
MCV: 94 fl (ref 78.0–100.0)
Platelets: 202 10*3/uL (ref 150.0–400.0)
RBC: 4.68 Mil/uL (ref 3.87–5.11)
RDW: 12.8 % (ref 11.5–15.5)
WBC: 4.8 10*3/uL (ref 4.0–10.5)

## 2021-07-19 LAB — HEMOGLOBIN A1C: Hgb A1c MFr Bld: 5.4 % (ref 4.6–6.5)

## 2021-07-19 MED ORDER — DOXYCYCLINE HYCLATE 100 MG PO TABS: 100.0000 mg | ORAL_TABLET | Freq: Two times a day (BID) | ORAL | 0 refills | Status: AC

## 2021-07-19 MED ORDER — TOPIRAMATE 25 MG PO TABS: ORAL_TABLET | ORAL | 2 refills | Status: DC

## 2021-07-19 MED ORDER — AMITRIPTYLINE HCL 25 MG PO TABS: 25.0000 mg | ORAL_TABLET | Freq: Every evening | ORAL | 1 refills | Status: DC | PRN

## 2021-07-19 NOTE — Assessment & Plan Note (Signed)
Urine culture at urgent care, and in the office a couple of weeks ago both with group B strep in both with less than 50,000 colony units.  Discussed with patient that these may not mean that she truly has a urine infection, however she has noticed improvement in her dysuria with treatment of antibiotics.  Discussed watching and waiting versus starting antibiotics today and she would like to start antibiotics.  Of note patient with allergy and intolerance to penicillins and cephalosporins.  She did do well with doxycycline on her initial infection so we will try treatment with this.  Repeat culture ordered but due to recurrent symptoms as well as questionable cultures discussed referral to urology for further recommendation and treatment.  Discussed consideration for d-mannose when she completes her current course of antibiotics. ?

## 2021-07-19 NOTE — Assessment & Plan Note (Signed)
Persistent fatigue which does worsen when she has urine symptoms.  Suspect this is related to her working long days as it does improve on days when she is not working.  However will screen for vitamin D deficiency well is checking routine labs.  She has had normal thyroid in the past and she is on a B12 supplement and when this was last checked it was normal.  Urged her to reduce number of hours at work and take frequent breaks.  Of note she is drinking 64 ounces of water a day encouraged her to continue to do this. ?

## 2021-07-19 NOTE — Assessment & Plan Note (Signed)
Continue Topamax and amitriptyline.  Stable. ?

## 2021-07-19 NOTE — Progress Notes (Signed)
? ?Subjective:  ? ?  ?Kristen Caldwell is a 76 y.o. female presenting for Dysuria (X 2 days ) and Back Pain (X 5 days ) ?  ? ? ?HPI ? ?#tired ?- every time she works - feels drained ?- working 3 days a week ?- on days that she does not work she overall feels better ?- works making window treatments ?- last week started having back pain  ?- has been wearing a back brace at work - x 5 days ?- then started having burning w/ urination x 2 days ?- wants to sit down and rest ?- no sob ?- no cp ?- no recent dizziness or headaches ?- symptoms improve with hydration and rest ?- drinking more water - with [employer]el - 64 oz of water daily ?- decaf tea at night ?-  ?Constipation and weight gain ? ?Review of Systems ? ? ?Social History  ? ?Tobacco Use  ?Smoking Status Never  ?Smokeless Tobacco Never  ? ? ? ?   ?Objective:  ?  ?BP Readings from Last 3 Encounters:  ?07/19/21 110/60  ?06/29/21 120/70  ?06/16/21 120/60  ? ?Wt Readings from Last 3 Encounters:  ?07/19/21 101 lb 7 oz (46 kg)  ?06/29/21 103 lb 3 oz (46.8 kg)  ?06/16/21 103 lb 5 oz (46.9 kg)  ? ? ?BP 110/60   Pulse 83   Temp (!) 97.5 ?F (36.4 ?C) (Oral)   Wt 101 lb 7 oz (46 kg)   SpO2 99%   BMI 18.70 kg/m?  ? ? ?Physical Exam ?Constitutional:   ?   General: She is not in acute distress. ?   Appearance: She is well-developed. She is not diaphoretic.  ?HENT:  ?   Head: Normocephalic and atraumatic.  ?Eyes:  ?   Conjunctiva/sclera: Conjunctivae normal.  ?Cardiovascular:  ?   Rate and Rhythm: Normal rate and regular rhythm.  ?   Heart sounds: Normal heart sounds.  ?Pulmonary:  ?   Effort: Pulmonary effort is normal.  ?Musculoskeletal:  ?   Cervical back: Neck supple.  ?   Comments: Back ?Inspection: kyphosis, otherwise normal ?Palpation: no spinous or Paraspinous ttp ?ROM: normal ?Strength: normal ?Straight leg raise - negative  ?Skin: ?   General: Skin is warm and dry.  ?Neurological:  ?   Mental Status: She is alert.  ? ? ? ? ? ?   ?Assessment & Plan:   ? ?Problem List Items Addressed This Visit   ? ?  ? Genitourinary  ? Group B streptococcal UTI  ?  Urine culture at urgent care, and in the office a couple of weeks ago both with group B strep in both with less than 50,000 colony units.  Discussed with patient that these may not mean that she truly has a urine infection, however she has noticed improvement in her dysuria with treatment of antibiotics.  Discussed watching and waiting versus starting antibiotics today and she would like to start antibiotics.  Of note patient with allergy and intolerance to penicillins and cephalosporins.  She did do well with doxycycline on her initial infection so we will try treatment with this.  Repeat culture ordered but due to recurrent symptoms as well as questionable cultures discussed referral to urology for further recommendation and treatment.  Discussed consideration for d-mannose when she completes her current course of antibiotics. ? ?  ?  ?  ? Other  ? Other fatigue  ?  Persistent fatigue which does worsen when she has urine  symptoms.  Suspect this is related to her working long days as it does improve on days when she is not working.  However will screen for vitamin D deficiency well is checking routine labs.  She has had normal thyroid in the past and she is on a B12 supplement and when this was last checked it was normal.  Urged her to reduce number of hours at work and take frequent breaks.  Of note she is drinking 64 ounces of water a day encouraged her to continue to do this. ? ?  ?  ? Relevant Orders  ? Comprehensive metabolic panel  ? CBC  ? Vitamin D, 25-hydroxy  ? Chronic nonintractable headache  ?  Continue Topamax and amitriptyline.  Stable. ? ?  ?  ? Relevant Medications  ? topiramate (TOPAMAX) 25 MG tablet  ? amitriptyline (ELAVIL) 25 MG tablet  ? ?Other Visit Diagnoses   ? ? Acute cystitis without hematuria    -  Primary  ? Relevant Medications  ? doxycycline (VIBRA-TABS) 100 MG tablet  ? Other Relevant  Orders  ? Urine Culture  ? Ambulatory referral to Urology  ? Dysuria      ? Relevant Orders  ? POCT Urinalysis Dipstick (Automated) (Completed)  ? Ambulatory referral to Urology  ? Anxiety state      ? Relevant Medications  ? amitriptyline (ELAVIL) 25 MG tablet  ? Elevated glucose      ? Relevant Orders  ? Comprehensive metabolic panel  ? Hemoglobin A1c  ? ?  ? ? ? ?Return if symptoms worsen or fail to improve. ? ?[name], MD ? ? ? ?

## 2021-07-19 NOTE — Patient Instructions (Addendum)
Urine infection ?- take antibiotic ?- urine culture ? ?#Referral ?I have placed a referral to a specialist for you. You should receive a phone call from the specialty office. Make sure your voicemail is not full and that if you are able to answer your phone to unknown or new numbers.  ? ?It may take up to 2 weeks to hear about the referral. If you do not hear anything in 2 weeks, please call our office and ask to speak with the referral coordinator.  ? ?D-Mannose supplement - can consider starting after you finish the antibiotics ? ?Fatigue ?- labs today  ?- consider shorter shifts, more frequent breaks at work ?

## 2021-07-20 LAB — URINE CULTURE
MICRO NUMBER:: 13364885
SPECIMEN QUALITY:: ADEQUATE

## 2021-07-20 LAB — VITAMIN D 25 HYDROXY (VIT D DEFICIENCY, FRACTURES): VITD: 48.11 ng/mL (ref 30.00–100.00)

## 2021-07-21 ENCOUNTER — Encounter: Payer: Self-pay | Admitting: Family Medicine

## 2021-07-21 DIAGNOSIS — B379 Candidiasis, unspecified: Secondary | ICD-10-CM

## 2021-07-21 MED ORDER — FLUCONAZOLE 150 MG PO TABS: 150.0000 mg | ORAL_TABLET | Freq: Once | ORAL | 0 refills | Status: AC

## 2021-07-28 ENCOUNTER — Ambulatory Visit: Payer: Medicare Other | Admitting: Urology

## 2021-07-28 ENCOUNTER — Encounter: Payer: Self-pay | Admitting: Urology

## 2021-07-28 VITALS — BP 130/77 | HR 101 | Ht 61.0 in | Wt 101.0 lb

## 2021-07-28 DIAGNOSIS — R310 Gross hematuria: Secondary | ICD-10-CM | POA: Diagnosis not present

## 2021-07-28 DIAGNOSIS — Z87440 Personal history of urinary (tract) infections: Secondary | ICD-10-CM

## 2021-07-28 LAB — URINALYSIS, COMPLETE
Bilirubin, UA: NEGATIVE
Glucose, UA: NEGATIVE
Nitrite, UA: NEGATIVE
Protein,UA: NEGATIVE
RBC, UA: NEGATIVE
Specific Gravity, UA: 1.01 (ref 1.005–1.030)
Urobilinogen, Ur: 0.2 mg/dL (ref 0.2–1.0)
pH, UA: 6 (ref 5.0–7.5)

## 2021-07-28 LAB — MICROSCOPIC EXAMINATION
Bacteria, UA: NONE SEEN
Epithelial Cells (non renal): NONE SEEN /hpf (ref 0–10)

## 2021-07-28 NOTE — Patient Instructions (Signed)
Cranberry and d-mannose  

## 2021-07-28 NOTE — Progress Notes (Signed)
? ?07/28/2021 ?12:15 PM  ? ?Tyler Aas Molstad ?1945-08-29 ?[id number] ? ?Referring provider: [name], MD ?[address] [place],  [place] [address] ? ?Chief Complaint  ?Patient presents with  ? Urinary Tract Infection  ? ? ?HPI: ?Kristen Caldwell is a 76 y.o. female referred for evaluation of recent UTI ? ?Visit [facility]/12/2021 with complaints of lightheadedness and nausea ?She had no voiding complaints but a UA was checked which was essentially unremarkable (trace leukocytes, 0-3 WBC and rare bacteria) ?A urine culture was ordered which grew 10,000-25,000 beta-hemolytic strep and she was treated with doxycycline ?Seen in follow-up by PCP 06/29/2021 complaining of dysuria.  Dipstick urine with 1+ leukocytes.  Urine culture grew 10-49 K strep agalactiae.  Treated with cefuroxime with resolution of symptoms.  Follow-up urine culture 5/8 grew mixed flora ?She is presently asymptomatic ?No prior chronic urologic problems ? ? ?PMH: ?Past Medical History:  ?Diagnosis Date  ? Anxiety   ? Chronic headaches   ? COVID 03/03/2020  ? Depression   ? Diverticulosis   ? GERD (gastroesophageal reflux disease)   ? Osteoporosis   ? ? ?Surgical History: ?Past Surgical History:  ?Procedure Laterality Date  ? ABDOMINAL HYSTERECTOMY  03/14/1989  ? partial  ? COLONOSCOPY    ? TUBAL LIGATION    ? ? ?Home Medications:  ?Allergies as of 07/28/2021   ? ?   Reactions  ? Cefuroxime Nausea Only  ? Amoxicillin   ? REACTION: NAUSEA  ? Cephalexin   ? REACTION: NAUSEA AND VOMITING  ? Penicillins   ? REACTION: TOOK TABLETS WITHOUT PROBLEM  ? Sertraline Diarrhea  ? Promethazine Hcl Rash  ? ?  ? ?  ?Medication List  ?  ? ?  ? Accurate as of Jul 28, 2021 12:15 PM. If you have any questions, ask your nurse or doctor.  ?  ?  ? ?  ? ?amitriptyline 25 MG tablet ?Commonly known as: ELAVIL ?Take 1 tablet (25 mg total) by mouth at bedtime as needed. for sleep ?  ?Caltrate 600+D Plus 600-400 MG-UNIT per tablet ?Take 2  tablets by mouth daily. ?  ?Centrum Silver Chew ?Chew 1 tablet by mouth daily. ?  ?clorazepate 3.75 MG tablet ?Commonly known as: TRANXENE ?Take 1 tablet (3.75 mg total) by mouth daily. ?  ?estradiol 0.5 MG tablet ?Commonly known as: ESTRACE ?Take 1/2 (0.'25mg'$ ) of a pill daily. ?  ?FISH OIL PO ?Take 1 capsule by mouth 2 (two) times daily. ?  ?meclizine 12.5 MG tablet ?Commonly known as: ANTIVERT ?Take 1 tablet (12.5 mg total) by mouth 3 (three) times daily as needed for dizziness. ?  ?omeprazole 20 MG capsule ?Commonly known as: PRILOSEC ?Take 1 capsule by mouth once daily ?  ?ondansetron 8 MG tablet ?Commonly known as: Zofran ?Take 1 tablet (8 mg total) by mouth every 8 (eight) hours as needed for nausea or vomiting. ?  ?topiramate 25 MG tablet ?Commonly known as: TOPAMAX ?TAKE 1 TABLET(25 MG) BY MOUTH TWICE DAILY ?  ?TURMERIC PO ?Take by mouth. ?  ?vitamin B-12 1000 MCG tablet ?Commonly known as: CYANOCOBALAMIN ?Take 1,000 mcg by mouth daily. ?  ? ?  ? ? ?Allergies:  ?Allergies  ?Allergen Reactions  ? Cefuroxime Nausea Only  ? Amoxicillin   ?  REACTION: NAUSEA  ? Cephalexin   ?  REACTION: NAUSEA AND VOMITING  ? Penicillins   ?  REACTION: TOOK TABLETS WITHOUT PROBLEM  ? Sertraline Diarrhea  ?  Promethazine Hcl Rash  ? ? ?Family History: ?Family History  ?Problem Relation Age of Onset  ? Aneurysm Mother   ? Stroke Father   ? Hypertension Father   ? Hypertension Brother   ? Heart attack Brother   ? Lupus Sister   ? Osteoporosis Sister   ? Diabetes Paternal Grandfather   ? Breast cancer Paternal Aunt   ? Colon cancer Neg Hx   ? ? ?Social History:  reports that she has never smoked. She has never used smokeless tobacco. She reports that she does not drink alcohol and does not use drugs. ? ? ?Physical Exam: ?BP 130/77   Pulse (!) 101   Ht '5\' 1"'$  (1.549 m)   Wt 101 lb (45.8 kg)   BMI 19.08 kg/m?   ?Constitutional:  Alert and oriented, No acute distress. ?HEENT: [place] AT, moist mucus membranes.  Trachea midline, no  masses. ?Cardiovascular: No clubbing, cyanosis, or edema. ?Respiratory: Normal respiratory effort, no increased work of breathing. ?Psychiatric: Normal mood and affect. ? ?Laboratory Data: ? ?Urinalysis ?Dipstick 1+ leukocytes ?Microscopy negative ? ? ?Assessment & Plan:   ?Urinalysis was normal at her urgent care visit 06/12/2021 and she had no voiding complaints.  Urine culture most likely represented colonization ?She was symptomatic 4/18 and had resolution of her symptoms with a follow-up negative culture ?Does not meet the AUA definition of recurrent UTI ?We discussed supplements which may be beneficial in preventing UTIs including cranberry and D-mannose ?Follow-up as needed for UTI symptoms ? ? ?Abbie Sons, MD ? ?[facility] ?[address], [address] ?[place], [place] [address] ?[phone] ?

## 2021-09-29 ENCOUNTER — Telehealth: Payer: Self-pay

## 2021-09-29 NOTE — Chronic Care Management (AMB) (Signed)
    Chronic Care Management Pharmacy Assistant   Name: Kristen Caldwell  MRN: [id number] DOB: 30-Mar-1945   Reason for Encounter: Reminder Call   Conditions to be addressed/monitored: HLD and CKD Stage 3 Anxiety Disorder   Medications: Outpatient Encounter Medications as of 09/29/2021  Medication Sig   amitriptyline (ELAVIL) 25 MG tablet Take 1 tablet (25 mg total) by mouth at bedtime as needed. for sleep   Calcium Carbonate-Vit D-Min (CALTRATE 600+D PLUS) 600-400 MG-UNIT per tablet Take 2 tablets by mouth daily.   clorazepate (TRANXENE) 3.75 MG tablet Take 1 tablet (3.75 mg total) by mouth daily.   estradiol (ESTRACE) 0.5 MG tablet Take 1/2 (0.'25mg'$ ) of a pill daily.   meclizine (ANTIVERT) 12.5 MG tablet Take 1 tablet (12.5 mg total) by mouth 3 (three) times daily as needed for dizziness.   Multiple Vitamins-Minerals (CENTRUM SILVER) CHEW Chew 1 tablet by mouth daily.   Omega-3 Fatty Acids (FISH OIL PO) Take 1 capsule by mouth 2 (two) times daily.   omeprazole (PRILOSEC) 20 MG capsule Take 1 capsule by mouth once daily   ondansetron (ZOFRAN) 8 MG tablet Take 1 tablet (8 mg total) by mouth every 8 (eight) hours as needed for nausea or vomiting.   topiramate (TOPAMAX) 25 MG tablet TAKE 1 TABLET(25 MG) BY MOUTH TWICE DAILY   TURMERIC PO Take by mouth.   vitamin B-12 (CYANOCOBALAMIN) 1000 MCG tablet Take 1,000 mcg by mouth daily.   No facility-administered encounter medications on file as of 09/29/2021.   Kristen Caldwell was contacted to remind of upcoming telephone visit with Charlene Brooke  on 10/04/21 at 11:45am. Patient was reminded to have any blood glucose and blood pressure readings available for review at appointment.   Patient confirmed appointment.   Are you having any problems with your medications? No   Do you have any concerns you like to discuss with the pharmacist? No   CCM referral has been placed prior to visit?  No    Star Rating  Drugs: Medication:  Last Fill: Day Supply No star medications identified  Charlene Brooke, CPP notified  Avel Sensor, [place]  [phone]

## 2021-10-04 ENCOUNTER — Telehealth: Payer: PPO

## 2021-10-04 ENCOUNTER — Ambulatory Visit: Payer: PPO | Admitting: Pharmacist

## 2021-10-04 DIAGNOSIS — E785 Hyperlipidemia, unspecified: Secondary | ICD-10-CM

## 2021-10-04 DIAGNOSIS — Z79890 Hormone replacement therapy (postmenopausal): Secondary | ICD-10-CM

## 2021-10-04 DIAGNOSIS — F410 Panic disorder [episodic paroxysmal anxiety] without agoraphobia: Secondary | ICD-10-CM

## 2021-10-04 DIAGNOSIS — M810 Age-related osteoporosis without current pathological fracture: Secondary | ICD-10-CM

## 2021-10-04 NOTE — Progress Notes (Signed)
Chronic Care Management Pharmacy Note  10/11/2021 Name:  Kristen Caldwell MRN:  [id number] DOB:  Jul 11, 1945  Summary: CCM F/U visit -Reviewed medications; pt affirms compliance -Pt has not tried to taper down on clorazepate, every time she thinks about it, something stressful happens in her life causing her to postpone again  Recommendations/Changes made from today's visit: -No med changes  Plan: -The patient has been provided with contact information for the care management team and has been advised to call with any health related questions or concerns.  -PCP 11/16/21 CPE    Subjective: Kristen Caldwell is an 76 y.o. year old female who is a primary patient of Cody, Jobe Marker, MD.  The CCM team was consulted for assistance with disease management and care coordination needs.    Engaged with patient by telephone for follow up visit in response to provider referral for pharmacy case management and/or care coordination services.   Consent to Services:  The patient was given information about Chronic Care Management services, agreed to services, and gave verbal consent prior to initiation of services.  Please see initial visit note for detailed documentation.   Patient Care Team: [name], MD as PCP - General (Family Medicine) Renato Shin, MD (Inactive) as Consulting Physician (Endocrinology) Charlton Haws, [facility] as Pharmacist (Pharmacist)  Patient lives at home and works part time for [employer].  Recent office visits: 07/19/21 Dr Einar Pheasant OV: dysuria. Recent Ucx Group B strep < 50,000 CFU, may or may not be true infection. Watchful waiting vs abx - pt chose abx. Rx'd doxycycline.  06/29/21 Dr Einar Pheasant OV: dysuria - Ucx Groub B strep. Rx'd cefuroxime and fluconazole.  06/16/21 Dr Einar Pheasant OV: chronic dizziness - rx'd Meclizine. Anticipate ENT referral if no improvement.  05/03/21 Dr Einar Pheasant OV: ankle pain - Xray WNL. Discussed clorazepate - try 1/2  dose daily prior to stopping, consider Buspar if needed.  02/22/21-Family Medicine-Tabitha Dugal,FNP-Patient presented for right arm pain.Referral for physical therapy, use voltaren gel ,OTC turmeric as needed,labs ordered( normal limits),follow up 3 months   11/12/20-PCP-Jessica Cody,MD-Patient presented for AWV. Labs ordered( abnormal blood sugar, cholesterol slightly high) discussed screenings,vaccines,no medication changes  10/05/20-PCP-Jessica Cody,MD-Patient presented for jaw pain.Recommend dentist for new mouth guard.voltaren gel apply to jaw for pain   Recent consult visits: 07/28/21 Dr Bernardo Heater (Urology): Hx UTI - does not meet criteria for recurrent UTI. Reviewed supplements for preventing UTI. F/u PRN.  07/2020 - saw Dr Loanne Drilling (endocrine) for osteoporosis. Ordered Evenity.  Hospital visits: None in previous 6 months   Objective:  Lab Results  Component Value Date   CREATININE 0.85 07/19/2021   BUN 12 07/19/2021   GFR 67.06 07/19/2021   GFRNONAA >60 05/12/2020   GFRAA 88 06/19/2015   NA 134 (L) 07/19/2021   K 4.8 07/19/2021   CALCIUM 9.9 07/19/2021   CO2 27 07/19/2021   GLUCOSE 94 07/19/2021    Lab Results  Component Value Date/Time   HGBA1C 5.4 07/19/2021 09:14 AM   GFR 67.06 07/19/2021 09:14 AM   GFR 75.72 02/22/2021 10:55 AM   MICROALBUR 1.0 06/09/2008 09:35 AM    Last diabetic Eye exam: No results found for: "HMDIABEYEEXA"  Last diabetic Foot exam: No results found for: "HMDIABFOOTEX"   Lab Results  Component Value Date   CHOL 182 11/12/2020   HDL 67.40 11/12/2020   LDLCALC 102 (H) 11/12/2020   LDLDIRECT 122.5 05/17/2012   TRIG 64.0 11/12/2020   CHOLHDL 3 11/12/2020  Latest Ref Rng & Units 07/19/2021    9:14 AM 02/22/2021   10:55 AM 11/12/2020    2:42 PM  Hepatic Function  Total Protein 6.0 - 8.3 g/dL 7.6  7.0  6.8   Albumin 3.5 - 5.2 g/dL 4.3  4.1  3.9   AST 0 - 37 U/L _0 ALT 0 - 35 U/L _1 Alk Phosphatase 39 - 117 U/L 63   75  84   Total Bilirubin 0.2 - 1.2 mg/dL 0.4  0.4  0.4     Lab Results  Component Value Date/Time   TSH 2.89 07/27/2020 11:23 AM   TSH 2.51 08/28/2019 02:56 PM   FREET4 0.79 07/27/2020 11:23 AM       Latest Ref Rng & Units 07/19/2021    9:14 AM 05/12/2020    9:28 AM 08/28/2019    2:56 PM  CBC  WBC 4.0 - 10.5 K/uL 4.8  6.6  6.2   Hemoglobin 12.0 - 15.0 g/dL 14.4  14.7  13.6   Hematocrit 36.0 - 46.0 % 44.0  43.7  40.3   Platelets 150.0 - 400.0 K/uL 202.0  180  212.0     Lab Results  Component Value Date/Time   VD25OH 48.11 07/19/2021 09:14 AM   VD25OH 56.57 07/27/2020 11:23 AM    Clinical ASCVD: No  The 10-year ASCVD risk score (Arnett DK, et al., 2019) is: 22.6%   Values used to calculate the score:     Age: 28 years     Sex: Female     Is Non-Hispanic African American: No     Diabetic: No     Tobacco smoker: No     Systolic Blood Pressure: 354 mmHg     Is BP treated: No     HDL Cholesterol: 67.4 mg/dL     Total Cholesterol: 182 mg/dL       11/12/2020    3:15 PM 11/08/2019    3:38 PM 11/02/2018    9:04 AM  Depression screen PHQ 2/9  Decreased Interest 0 1 0  Down, Depressed, Hopeless 1 1 0  PHQ - 2 Score 1 2 0  Altered sleeping 0 0 1  Tired, decreased energy 1 0 0  Change in appetite 0 0 0  Feeling bad or failure about yourself  0 0 0  Trouble concentrating 0 0 0  Moving slowly or fidgety/restless 0 0 0  Suicidal thoughts 0 0 0  PHQ-9 Score _2 Difficult doing work/chores Not difficult at all Somewhat difficult Not difficult at all       11/12/2020    3:15 PM  GAD 7 : Generalized Anxiety Score  Nervous, Anxious, on Edge 1  Control/stop worrying 1  Worry too much - different things 1  Trouble relaxing 0  Easily annoyed or irritable 0  Afraid - awful might happen 0  Anxiety Difficulty Not difficult at all    Social History   Tobacco Use  Smoking Status Never  Smokeless Tobacco Never   BP Readings from Last 3 Encounters:  07/28/21 130/77  07/19/21  110/60  06/29/21 120/70   Pulse Readings from Last 3 Encounters:  07/28/21 (!) 101  07/19/21 83  06/29/21 86   Wt Readings from Last 3 Encounters:  07/28/21 101 lb (45.8 kg)  07/19/21 101 lb 7 oz (46 kg)  06/29/21 103 lb 3 oz (46.8 kg)   BMI Readings from Last 3  Encounters:  07/28/21 19.08 kg/m  07/19/21 18.70 kg/m  06/29/21 19.03 kg/m    Assessment/Interventions: Review of patient past medical history, allergies, medications, health status, including review of consultants reports, laboratory and other test data, was performed as part of comprehensive evaluation and provision of chronic care management services.   SDOH:  (Social Determinants of Health) assessments and interventions performed: Yes   SDOH Screenings   Alcohol Screen: Low Risk  (11/08/2019)   Alcohol Screen    Last Alcohol Screening Score (AUDIT): 0  Depression (PHQ2-9): Low Risk  (11/12/2020)   Depression (PHQ2-9)    PHQ-2 Score: 2  Financial Resource Strain: Medium Risk (04/07/2021)   Overall Financial Resource Strain (CARDIA)    Difficulty of Paying Living Expenses: Somewhat hard  Food Insecurity: No Food Insecurity (11/08/2019)   Hunger Vital Sign    Worried About Running Out of Food in the Last Year: Never true    Ran Out of Food in the Last Year: Never true  Housing: Low Risk  (11/08/2019)   Housing    Last Housing Risk Score: 0  Physical Activity: Inactive (11/08/2019)   Exercise Vital Sign    Days of Exercise per Week: 0 days    Minutes of Exercise per Session: 0 min  Social Connections: Not on file  Stress: No Stress Concern Present (11/08/2019)   [place]    Feeling of Stress : Not at all  Tobacco Use: Low Risk  (07/28/2021)   Patient History    Smoking Tobacco Use: Never    Smokeless Tobacco Use: Never    Passive Exposure: Not on file  Transportation Needs: No Transportation Needs (04/07/2021)   PRAPARE - Transportation     Lack of Transportation (Medical): No    Lack of Transportation (Non-Medical): No    CCM Care Plan  Allergies  Allergen Reactions   Cefuroxime Nausea Only   Amoxicillin     REACTION: NAUSEA   Cephalexin     REACTION: NAUSEA AND VOMITING   Penicillins     REACTION: TOOK TABLETS WITHOUT PROBLEM   Sertraline Diarrhea   Promethazine Hcl Rash    Medications Reviewed Today     Reviewed by Abbie Sons, MD (Physician) on 07/28/21 at 1226  Med List Status: <None>   Medication Order Taking? Sig Documenting Provider Last Dose Status Informant  amitriptyline (ELAVIL) 25 MG tablet [id number] Yes Take 1 tablet (25 mg total) by mouth at bedtime as needed. for sleep [name], MD Taking Active   Calcium Carbonate-Vit D-Min (CALTRATE 600+D PLUS) 600-400 MG-UNIT per tablet [id number] Yes Take 2 tablets by mouth daily. [provider] Taking Active   clorazepate (TRANXENE) 3.75 MG tablet [id number] Yes Take 1 tablet (3.75 mg total) by mouth daily. [name], MD Taking Active   estradiol (ESTRACE) 0.5 MG tablet [id number] Yes Take 1/2 (0.$RemoveBef'25mg'[id number]$ ) of a pill daily. [name], MD Taking Active   meclizine (ANTIVERT) 12.5 MG tablet [id number] Yes Take 1 tablet (12.5 mg total) by mouth 3 (three) times daily as needed for dizziness. [name], MD Taking Active   Multiple Vitamins-Minerals (CENTRUM [place]) CHEW [id number] Yes Chew 1 tablet by mouth daily. [provider] Taking Active   Omega-3 Fatty Acids (FISH OIL PO) [id number] Yes Take 1 capsule by mouth 2 (two) times daily. [provider] Taking Active   omeprazole (PRILOSEC) 20 MG capsule [id number] Yes Take 1 capsule by mouth once daily Waunita Schooner  R, MD Taking Active   ondansetron (ZOFRAN) 8 MG tablet [id number] Yes Take 1 tablet (8 mg total) by mouth every 8 (eight) hours as needed for nausea or vomiting. [name], MD Taking Active   topiramate (TOPAMAX) 25 MG tablet [id number] Yes TAKE 1 TABLET(25 MG)  BY MOUTH TWICE DAILY [name], MD Taking Active   TURMERIC PO [id number] Yes Take by mouth. [provider] Taking Active   vitamin B-12 (CYANOCOBALAMIN) 1000 MCG tablet [id number] Yes Take 1,000 mcg by mouth daily. 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   Postural dizziness with near syncope 05/20/2020   CKD (chronic kidney disease) stage 3, GFR 30-59 ml/min (HCC) 01/07/2020   Chronic bilateral low back pain without sciatica 01/07/2020   Dupuytren's contracture of right hand 11/11/2019   Ear noise/buzzing, right 11/11/2019   Postmenopausal HRT (hormone replacement therapy) 05/23/2013   HLD (hyperlipidemia) 06/09/2008   Generalized anxiety disorder with panic attacks 02/21/2007   Depression 02/21/2007   Migraines 02/21/2007   GERD 02/21/2007   Osteoporosis 02/21/2007   CARPAL TUNNEL SYNDROME, HX OF 02/21/2007    Immunization History  Administered Date(s) Administered   Fluad Quad(high Dose 65+) 11/09/2018, 12/14/2019, 11/12/2020   Influenza Split 01/31/2011, 12/15/2011   Influenza Whole 12/10/2007, 03/10/2009, 12/08/2009   Influenza,inj,Quad PF,6+ Mos 12/26/2012, 11/07/2013, 02/13/2015, 11/13/2015, 12/14/2017   Influenza-Unspecified 12/19/2016   PFIZER(Purple Top)SARS-COV-2 Vaccination 04/27/2019, 05/18/2019, 08/21/2020   Pneumococcal Conjugate-13 05/29/2014   Pneumococcal Polysaccharide-23 04/25/2011   Td 02/08/2006   Tdap 07/01/2016   Zoster Recombinat (Shingrix) 11/15/2019, 04/10/2020   Zoster, Live 07/30/2009    Conditions to be addressed/monitored:  Hyperlipidemia, Anxiety, and  Osteoporosis  Care Plan : [place]  Updates made by Charlton Haws, [place] since 10/11/2021 12:00 AM     Problem: Hyperlipidemia, Anxiety, and Osteoporosis      Long-Range Goal: Disease mgmt   Start Date: 04/07/2021  Expected End Date: 04/07/2022  This Visit's Progress: On track  Recent Progress: On track  Priority: High  Note:   Current Barriers:  None identified  Pharmacist Clinical Goal(s):  Patient will contact provider office for questions/concerns as evidenced notation of same in electronic health record through collaboration with PharmD and provider.   Interventions: 1:1 collaboration with [name], MD regarding development and update of comprehensive plan of care as evidenced by provider attestation and co-signature Inter-disciplinary care team collaboration (see longitudinal plan of care) Comprehensive medication review performed; medication list updated in electronic medical record  Hyperlipidemia: (LDL goal < 100) -Controlled - LDL 102 (11/2020) near goal without statin -Current treatment: OTC Omega-3 Fish oil - Appropriate, Effective, Safe, Accessible -Educated on Cholesterol goals;  -Recommended to continue current medication  Insomnia / Anxiety (Goal: manage symptoms) -Controlled - pt reports medication is very helpful for sleep and anxiety; she has been on clorazepate for many years, recently her insurance plan has increased the price -PHQ9: 2 (11/2020) - minimal depression -GAD7: 3 (11/2020) - minimal anxiety -Current treatment: Amitriptyline 25 mg HS - Appropriate, Effective, Safe, Accessible Clorazepate 3.75 mg daily AM -  Appropriate, Effective, Safe, Accessible -Medications previously tried/failed: sertraline -Connected with PCP for mental health support -Educated on Benefits of medication for symptom control -Assessed formulary - clorazepate is Tier 3 with U[employer], tier exception request was denied -Recommended to continue current  medication  Osteoporosis (Goal prevent fractures) -Not ideally controlled - pt has not been able to tolerate multiple medications and declines further injections (tried to start Evenity last year but ultimately never started due to injection concerns) -Last DEXA Scan: 11/30/19   T-Score femoral neck: -3.9  T-Score total hip: -3.9  T-Score forearm radius: -2.3 -Patient is a candidate for pharmacologic treatment due to T-Score < -2.5 in femoral neck and T-Score < -2.5 in total hip  -Current treatment  Calcium carbonate 600 mg + Vitamin D -Medications previously tried: alendronate, Evista, Prolia (insomnia) -discussed fall precautions to limit risk for fracture -Recommend weight-bearing and muscle strengthening exercises for building and maintaining bone density.   Migraines (Goal: reduce frequency) -Controlled - pt can't remember last migraine -Current treatment  Amitriptyline 25 mg PRN -Appropriate, Effective, Safe, Accessible Topiramate 25 mg BID -Appropriate, Effective, Safe, Accessible -Recommended to continue current medication  Postmenopausal HRT (Goal: manage symptoms) -Controlled - pt reports no change in control after reducing estradiol to 1/2 dose -Current treatment  Estradiol 0.5 mg -1/2 tab daily -Appropriate, Effective, Safe, Accessible -Discussed importance of using lowest effective dose possible for hormones -Recommended to continue current medication  GERD (Goal: manage symptoms) -Controlled - pt reports rare breakthrough symptoms as long as she takes PPI -Current treatment  Omeprazole 20 mg daily -Appropriate, Effective, Safe, Accessible -Concern for PPI-induced bone density issues; pt has not been able to taper previously -Recommended to continue current medication   Patient Goals/Self-Care Activities Patient will:  - take medications as prescribed as evidenced by patient report and record review focus on medication adherence by pill box collaborate with  provider on medication access solutions      Medication Assistance:  Clorazepate - Tier 3 drug, tier exception denied 2023.  Compliance/Adherence/Medication fill history: Care Gaps: Colonoscopy (due 05/11/20)  Star-Rating Drugs: None  Medication Access: Within the past 30 days, how often has patient missed a dose of medication? 0 Is a pillbox or other method used to improve adherence? Yes  Factors that may affect medication adherence? no barriers identified Are meds synced by current pharmacy? No  Are meds delivered by current pharmacy? No  Does patient experience delays in picking up medications due to transportation concerns? No   Upstream Services Reviewed: Is patient disadvantaged to use [facility]?: No  Current Rx insurance plan: HTA Name and location of Current pharmacy:  [facility] [place], [place] - [address] [place] [address] Phone: [phone] Fax: [phone]  [facility] services reviewed with patient today?: No  Patient requests to transfer care to [facility] Pharmacy?: No  Reason patient declined to change pharmacies: Not mentioned at this visit   Care Plan and Follow Up Patient Decision:  Patient agrees to Care Plan and Follow-up.  Plan: The patient has been provided with contact information for the care management team and has been advised to call with any health related questions or concerns.   Charlene Brooke, PharmD, BCACP Clinical Pharmacist [facility] at [facility] [phone]

## 2021-10-08 DIAGNOSIS — M542 Cervicalgia: Secondary | ICD-10-CM | POA: Diagnosis not present

## 2021-10-08 DIAGNOSIS — M25512 Pain in left shoulder: Secondary | ICD-10-CM | POA: Diagnosis not present

## 2021-10-11 NOTE — Patient Instructions (Addendum)
Visit Information  Phone number for Pharmacist: [phone]   Goals Addressed   None     Care Plan : [place]  Updates made by Charlton Haws, RPH since 10/11/2021 12:00 AM     Problem: Hyperlipidemia, Anxiety, and Osteoporosis      Long-Range Goal: Disease mgmt   Start Date: 04/07/2021  Expected End Date: 04/07/2022  This Visit's Progress: On track  Recent Progress: On track  Priority: High  Note:   Current Barriers:  None identified  Pharmacist Clinical Goal(s):  Patient will contact provider office for questions/concerns as evidenced notation of same in electronic health record through collaboration with PharmD and provider.   Interventions: 1:1 collaboration with [name], MD regarding development and update of comprehensive plan of care as evidenced by provider attestation and co-signature Inter-disciplinary care team collaboration (see longitudinal plan of care) Comprehensive medication review performed; medication list updated in electronic medical record  Hyperlipidemia: (LDL goal < 100) -Controlled - LDL 102 (11/2020) near goal without statin -Current treatment: OTC Omega-3 Fish oil - Appropriate, Effective, Safe, Accessible -Educated on Cholesterol goals;  -Recommended to continue current medication  Insomnia / Anxiety (Goal: manage symptoms) -Controlled - pt reports medication is very helpful for sleep and anxiety; she has been on clorazepate for many years, recently her insurance plan has increased the price -PHQ9: 2 (11/2020) - minimal depression -GAD7: 3 (11/2020) - minimal anxiety -Current treatment: Amitriptyline 25 mg HS - Appropriate, Effective, Safe, Accessible Clorazepate 3.75 mg daily AM -Appropriate, Effective, Safe, Accessible -Medications previously tried/failed: sertraline -Connected with PCP for mental health support -Educated on Benefits of medication for symptom control -Assessed formulary - clorazepate is Tier 3  with [employer], tier exception request was denied -Recommended to continue current medication  Osteoporosis (Goal prevent fractures) -Not ideally controlled - pt has not been able to tolerate multiple medications and declines further injections (tried to start Evenity last year but ultimately never started due to injection concerns) -Last DEXA Scan: 11/30/19   T-Score femoral neck: -3.9  T-Score total hip: -3.9  T-Score forearm radius: -2.3 -Patient is a candidate for pharmacologic treatment due to T-Score < -2.5 in femoral neck and T-Score < -2.5 in total hip  -Current treatment  Calcium carbonate 600 mg + Vitamin D -Medications previously tried: alendronate, Evista, Prolia (insomnia) -discussed fall precautions to limit risk for fracture -Recommend weight-bearing and muscle strengthening exercises for building and maintaining bone density.   Migraines (Goal: reduce frequency) -Controlled - pt can't remember last migraine -Current treatment  Amitriptyline 25 mg PRN -Appropriate, Effective, Safe, Accessible Topiramate 25 mg BID -Appropriate, Effective, Safe, Accessible -Recommended to continue current medication  Postmenopausal HRT (Goal: manage symptoms) -Controlled - pt reports no change in control after reducing estradiol to 1/2 dose -Current treatment  Estradiol 0.5 mg -1/2 tab daily -Appropriate, Effective, Safe, Accessible -Discussed importance of using lowest effective dose possible for hormones -Recommended to continue current medication  GERD (Goal: manage symptoms) -Controlled - pt reports rare breakthrough symptoms as long as she takes PPI -Current treatment  Omeprazole 20 mg daily -Appropriate, Effective, Safe, Accessible -Concern for PPI-induced bone density issues; pt has not been able to taper previously -Recommended to continue current medication   Patient Goals/Self-Care Activities Patient will:  - take medications as prescribed as evidenced by patient report and  record review focus on medication adherence by pill box collaborate with provider on medication access solutions      Patient verbalizes understanding of instructions and  care plan provided today and agrees to view in [place]. Active MyChart status and patient understanding of how to access instructions and care plan via MyChart confirmed with patient.    The patient has been provided with contact information for the care management team and has been advised to call with any health related questions or concerns.   Charlene Brooke, PharmD, BCACP Clinical Pharmacist [facility] at [facility] [phone]

## 2021-10-29 ENCOUNTER — Other Ambulatory Visit: Payer: Self-pay | Admitting: Family Medicine

## 2021-10-29 DIAGNOSIS — F410 Panic disorder [episodic paroxysmal anxiety] without agoraphobia: Secondary | ICD-10-CM

## 2021-11-01 ENCOUNTER — Telehealth: Payer: Self-pay | Admitting: Family Medicine

## 2021-11-01 NOTE — Telephone Encounter (Signed)
  Encourage patient to contact the pharmacy for refills or they can request refills through [facility]  Did the patient contact the pharmacy: No  LAST APPOINTMENT DATE: 07/19/21  NEXT APPOINTMENT DATE: 11/16/21  MEDICATION: clorazepate (TRANXENE) 3.75 MG tablet  Is the patient out of medication? No  If not, how much is left? A few more  PHARMACY: [place], [place]  Let patient know to contact pharmacy at the end of the day to make sure medication is ready.  Please notify patient to allow 48-72 hours to process

## 2021-11-01 NOTE — Telephone Encounter (Signed)
Last refilled on 05/03/21 # 90 with 1 refill. Next OV 11/16/21

## 2021-11-02 NOTE — Telephone Encounter (Signed)
Rx filled on 11/01/21

## 2021-11-10 ENCOUNTER — Other Ambulatory Visit: Payer: PPO

## 2021-11-16 ENCOUNTER — Telehealth: Payer: Self-pay | Admitting: Radiology

## 2021-11-16 ENCOUNTER — Ambulatory Visit (INDEPENDENT_AMBULATORY_CARE_PROVIDER_SITE_OTHER): Payer: Medicare Other | Admitting: Family Medicine

## 2021-11-16 ENCOUNTER — Ambulatory Visit (INDEPENDENT_AMBULATORY_CARE_PROVIDER_SITE_OTHER): Payer: Medicare Other

## 2021-11-16 ENCOUNTER — Telehealth: Payer: Self-pay | Admitting: Family Medicine

## 2021-11-16 ENCOUNTER — Ambulatory Visit: Payer: PPO

## 2021-11-16 ENCOUNTER — Encounter: Payer: Self-pay | Admitting: Family Medicine

## 2021-11-16 VITALS — BP 110/74 | HR 82 | Temp 97.3°F | Wt 100.8 lb

## 2021-11-16 VITALS — Ht 61.0 in | Wt 101.0 lb

## 2021-11-16 DIAGNOSIS — Z1211 Encounter for screening for malignant neoplasm of colon: Secondary | ICD-10-CM

## 2021-11-16 DIAGNOSIS — E785 Hyperlipidemia, unspecified: Secondary | ICD-10-CM | POA: Diagnosis not present

## 2021-11-16 DIAGNOSIS — Z1231 Encounter for screening mammogram for malignant neoplasm of breast: Secondary | ICD-10-CM | POA: Diagnosis not present

## 2021-11-16 DIAGNOSIS — Z0000 Encounter for general adult medical examination without abnormal findings: Secondary | ICD-10-CM | POA: Diagnosis not present

## 2021-11-16 LAB — LIPID PANEL
Cholesterol: 218 mg/dL — ABNORMAL HIGH (ref 0–200)
HDL: 66.5 mg/dL (ref >39.00)
LDL Cholesterol: 127 mg/dL — ABNORMAL HIGH (ref 0–99)
NonHDL: 151.27
Total CHOL/HDL Ratio: 3
Triglycerides: 122 mg/dL (ref 0.0–149.0)
VLDL: 24.4 mg/dL (ref 0.0–40.0)

## 2021-11-16 NOTE — Telephone Encounter (Signed)
Patient called and stated that she didn't do the fecal test for colon and wanted to know would it mailed to her. Call back number [phone].

## 2021-11-16 NOTE — Telephone Encounter (Signed)
Patient came back for ifob

## 2021-11-16 NOTE — Patient Instructions (Signed)
Estrogen Taper Plan Week 1-3: Take 6 pills per week Week 4-6: Take 5 pills per week Week 7-10: Take 4 pills per week And so on   You will likely get some hot flashes - but if improving or infrequent would not return to higher dose.

## 2021-11-16 NOTE — Patient Instructions (Addendum)
Kristen Caldwell , Thank you for taking time to come for your Medicare Wellness Visit. I appreciate your ongoing commitment to your health goals. Please review the following plan we discussed and let me know if I can assist you in the future.   Screening recommendations/referrals: Colonoscopy: No longer required Mammogram: Done 11/21/20 Repeat 10 years Bone Density: Done 11/30/19 Recommended yearly ophthalmology/optometry visit for glaucoma screening and checkup Recommended yearly dental visit for hygiene and checkup  Vaccinations: Influenza vaccine: Up to date Pneumococcal vaccine: Up to date Tdap vaccine: Up to date Shingles vaccine: Done   Covid-19:Done  Advanced directives: Advance directive discussed with you today. Even though you declined this today, please call our office should you change your mind, and we can give you the proper paperwork for you to fill out.   Conditions/risks identified: None  Next appointment: Follow up in one year for your annual wellness visit    Preventive Care 65 Years and Older, Female Preventive care refers to lifestyle choices and visits with your health care provider that can promote health and wellness. What does preventive care include? A yearly physical exam. This is also called an annual well check. Dental exams once or twice a year. Routine eye exams. Ask your health care provider how often you should have your eyes checked. Personal lifestyle choices, including: Daily care of your teeth and gums. Regular physical activity. Eating a healthy diet. Avoiding tobacco and drug use. Limiting alcohol use. Practicing safe sex. Taking low-dose aspirin every day. Taking vitamin and mineral supplements as recommended by your health care provider. What happens during an annual well check? The services and screenings done by your health care provider during your annual well check will depend on your age, overall health, lifestyle risk factors, and family  history of disease. Counseling  Your health care provider may ask you questions about your: Alcohol use. Tobacco use. Drug use. Emotional well-being. Home and relationship well-being. Sexual activity. Eating habits. History of falls. Memory and ability to understand (cognition). Work and work Statistician. Reproductive health. Screening  You may have the following tests or measurements: Height, weight, and BMI. Blood pressure. Lipid and cholesterol levels. These may be checked every 5 years, or more frequently if you are over 29 years old. Skin check. Lung cancer screening. You may have this screening every year starting at age 40 if you have a 30-pack-year history of smoking and currently smoke or have quit within the past 15 years. Fecal occult blood test (FOBT) of the stool. You may have this test every year starting at age 29. Flexible sigmoidoscopy or colonoscopy. You may have a sigmoidoscopy every 5 years or a colonoscopy every 10 years starting at age 62. Hepatitis C blood test. Hepatitis B blood test. Sexually transmitted disease (STD) testing. Diabetes screening. This is done by checking your blood sugar (glucose) after you have not eaten for a while (fasting). You may have this done every 1-3 years. Bone density scan. This is done to screen for osteoporosis. You may have this done starting at age 53. Mammogram. This may be done every 1-2 years. Talk to your health care provider about how often you should have regular mammograms. Talk with your health care provider about your test results, treatment options, and if necessary, the need for more tests. Vaccines  Your health care provider may recommend certain vaccines, such as: Influenza vaccine. This is recommended every year. Tetanus, diphtheria, and acellular pertussis (Tdap, Td) vaccine. You may need a Td booster  every 10 years. Zoster vaccine. You may need this after age 56. Pneumococcal 13-valent conjugate (PCV13)  vaccine. One dose is recommended after age 41. Pneumococcal polysaccharide (PPSV23) vaccine. One dose is recommended after age 23. Talk to your health care provider about which screenings and vaccines you need and how often you need them. This information is not intended to replace advice given to you by your health care provider. Make sure you discuss any questions you have with your health care provider. Document Released: 03/27/2015 Document Revised: 11/18/2015 Document Reviewed: 12/30/2014 Elsevier Interactive Patient Education  2017 [place] Prevention in the Home Falls can cause injuries. They can happen to people of all ages. There are many things you can do to make your home safe and to help prevent falls. What can I do on the outside of my home? Regularly fix the edges of walkways and driveways and fix any cracks. Remove anything that might make you trip as you walk through a door, such as a raised step or threshold. Trim any bushes or trees on the path to your home. Use bright outdoor lighting. Clear any walking paths of anything that might make someone trip, such as rocks or tools. Regularly check to see if handrails are loose or broken. Make sure that both sides of any steps have handrails. Any raised decks and porches should have guardrails on the edges. Have any leaves, snow, or ice cleared regularly. Use sand or salt on walking paths during winter. Clean up any spills in your garage right away. This includes oil or grease spills. What can I do in the bathroom? Use night lights. Install grab bars by the toilet and in the tub and shower. Do not use towel bars as grab bars. Use non-skid mats or decals in the tub or shower. If you need to sit down in the shower, use a plastic, non-slip stool. Keep the floor dry. Clean up any water that spills on the floor as soon as it happens. Remove soap buildup in the tub or shower regularly. Attach bath mats securely with  double-sided non-slip rug tape. Do not have throw rugs and other things on the floor that can make you trip. What can I do in the bedroom? Use night lights. Make sure that you have a light by your bed that is easy to reach. Do not use any sheets or blankets that are too big for your bed. They should not hang down onto the floor. Have a firm chair that has side arms. You can use this for support while you get dressed. Do not have throw rugs and other things on the floor that can make you trip. What can I do in the kitchen? Clean up any spills right away. Avoid walking on wet floors. Keep items that you use a lot in easy-to-reach places. If you need to reach something above you, use a strong step stool that has a grab bar. Keep electrical cords out of the way. Do not use floor polish or wax that makes floors slippery. If you must use wax, use non-skid floor wax. Do not have throw rugs and other things on the floor that can make you trip. What can I do with my stairs? Do not leave any items on the stairs. Make sure that there are handrails on both sides of the stairs and use them. Fix handrails that are broken or loose. Make sure that handrails are as long as the stairways. Check any carpeting to  make sure that it is firmly attached to the stairs. Fix any carpet that is loose or worn. Avoid having throw rugs at the top or bottom of the stairs. If you do have throw rugs, attach them to the floor with carpet tape. Make sure that you have a light switch at the top of the stairs and the bottom of the stairs. If you do not have them, ask someone to add them for you. What else can I do to help prevent falls? Wear shoes that: Do not have high heels. Have rubber bottoms. Are comfortable and fit you well. Are closed at the toe. Do not wear sandals. If you use a stepladder: Make sure that it is fully opened. Do not climb a closed stepladder. Make sure that both sides of the stepladder are locked  into place. Ask someone to hold it for you, if possible. Clearly mark and make sure that you can see: Any grab bars or handrails. First and last steps. Where the edge of each step is. Use tools that help you move around (mobility aids) if they are needed. These include: Canes. Walkers. Scooters. Crutches. Turn on the lights when you go into a dark area. Replace any light bulbs as soon as they burn out. Set up your furniture so you have a clear path. Avoid moving your furniture around. If any of your floors are uneven, fix them. If there are any pets around you, be aware of where they are. Review your medicines with your doctor. Some medicines can make you feel dizzy. This can increase your chance of falling. Ask your doctor what other things that you can do to help prevent falls. This information is not intended to replace advice given to you by your health care provider. Make sure you discuss any questions you have with your health care provider. Document Released: 12/25/2008 Document Revised: 08/06/2015 Document Reviewed: 04/04/2014 Elsevier Interactive Patient Education  2017 [employer].

## 2021-11-16 NOTE — Progress Notes (Signed)
Annual Exam   Chief Complaint:  Chief Complaint  Patient presents with   Annual Exam    Not fasting    History of Present Illness:  [name] is a 76 y.o. No obstetric history on file. who LMP was No LMP recorded. Patient has had a hysterectomy., presents today for her annual examination.      Nutrition She does get adequate calcium and Vitamin D in her diet. Diet: healthy Exercise: working - keeping busy with her daily routine    Social History   Tobacco Use  Smoking Status Never  Smokeless Tobacco Never   Social History   Substance and Sexual Activity  Alcohol Use No   Social History   Substance and Sexual Activity  Drug Use No     General Health Dentist in the last year: Yes Eye doctor: yes  Safety The patient wears seatbelts: yes.     The patient feels safe at home and in their relationships: yes.   Menstrual:  Symptoms of menopause: getting nighttime hot flashes - is on estradiol 0.25 mg   GYN She is not sexually active.    Breast Cancer Screening (Age 58-74):  There is FH of breast cancer. There is no FH of ovarian cancer. BRCA screening Not Indicated.  Last Mammogram: 11/2020 The patient does want a mammogram this year.    Colon Cancer Screening:  Age 67-75 yo - benefits outweigh the risk. Adults 15-85 yo who have never been screened benefit.  Benefits: 134000 people in 2016 will be diagnosed and 49,000 will die - early detection helps [place]: Complications 2/2 to colonoscopy High Risk (Colonoscopy): genetic disorder (Lynch syndrome or familial adenomatous polyposis), personal hx of IBD, previous adenomatous polyp, or previous colorectal cancer, FamHx start 10 years before the age at diagnosis, increased in males and black race  Options:  FIT - looks for hemoglobin (blood in the stool) - specific and fairly sensitive - must be done annually Cologuard - looks for DNA and blood - more sensitive - therefore can have more  false positives, every 3 years Colonoscopy - every 10 years if normal - sedation, bowl prep, must have someone drive you  Shared decision making and the patient had decided to do FOBT.   Social History   Tobacco Use  Smoking Status Never  Smokeless Tobacco Never    Lung Cancer Screening (Ages 30-94): not applicable   Weight Wt Readings from Last 3 Encounters:  11/16/21 100 lb 12.8 oz (45.7 kg)  11/16/21 101 lb (45.8 kg)  07/28/21 101 lb (45.8 kg)   Patient has low BMI  BMI Readings from Last 1 Encounters:  11/16/21 19.05 kg/m     Chronic disease screening Blood pressure monitoring:  BP Readings from Last 3 Encounters:  11/16/21 110/74  07/28/21 130/77  07/19/21 110/60    Lipid Monitoring: Indication for screening: age >29, obesity, diabetes, family hx, CV risk factors.  Lipid screening: Yes  Lab Results  Component Value Date   CHOL 182 11/12/2020   HDL 67.40 11/12/2020   LDLCALC 102 (H) 11/12/2020   LDLDIRECT 122.5 05/17/2012   TRIG 64.0 11/12/2020   CHOLHDL 3 11/12/2020     Diabetes Screening: age >42, overweight, family hx, PCOS, hx of gestational diabetes, at risk ethnicity Diabetes Screening screening: Yes  Lab Results  Component Value Date   HGBA1C 5.4 07/19/2021     Past Medical History:  Diagnosis Date   Anxiety    Chronic headaches  COVID 03/03/2020   Depression    Diverticulosis    GERD (gastroesophageal reflux disease)    Osteoporosis     Past Surgical History:  Procedure Laterality Date   ABDOMINAL HYSTERECTOMY  03/14/1989   partial   COLONOSCOPY     TUBAL LIGATION      Prior to Admission medications   Medication Sig Start Date End Date Taking? Authorizing Provider  amitriptyline (ELAVIL) 25 MG tablet Take 1 tablet (25 mg total) by mouth at bedtime as needed. for sleep 07/19/21  Yes [name], MD  Calcium Carbonate-Vit D-Min (CALTRATE 600+D PLUS) 600-400 MG-UNIT per tablet Take 2 tablets by mouth daily.   Yes [provider]  clorazepate (TRANXENE) 3.75 MG tablet Take 1 tablet by mouth once daily 11/01/21  Yes [name], MD  estradiol (ESTRACE) 0.5 MG tablet Take 1/2 (0.29m) of a pill daily. 06/29/21  Yes CLesleigh Noe MD  meclizine (ANTIVERT) 12.5 MG tablet Take 1 tablet (12.5 mg total) by mouth 3 (three) times daily as needed for dizziness. 06/16/21  Yes CLesleigh Noe MD  Multiple Vitamins-Minerals (CENTRUM SILVER) CHEW Chew 1 tablet by mouth daily.   Yes [provider]  Omega-3 Fatty Acids (FISH OIL PO) Take 1 capsule by mouth 2 (two) times daily.   Yes [provider]  omeprazole (PRILOSEC) 20 MG capsule Take 1 capsule by mouth once daily 07/12/21  Yes CLesleigh Noe MD  ondansetron (ZOFRAN) 8 MG tablet Take 1 tablet (8 mg total) by mouth every 8 (eight) hours as needed for nausea or vomiting. 06/14/21  Yes CLesleigh Noe MD  topiramate (TOPAMAX) 25 MG tablet TAKE 1 TABLET(25 MG) BY MOUTH TWICE DAILY 07/19/21  Yes CLesleigh Noe MD  TURMERIC PO Take by mouth.   Yes [provider]  vitamin B-12 (CYANOCOBALAMIN) 1000 MCG tablet Take 1,000 mcg by mouth daily.   Yes [provider]    Allergies  Allergen Reactions   Cefuroxime Nausea Only   Amoxicillin     REACTION: NAUSEA   Cephalexin     REACTION: NAUSEA AND VOMITING   Penicillins     REACTION: TOOK TABLETS WITHOUT PROBLEM   Sertraline Diarrhea   Promethazine Hcl Rash    Gynecologic History: No LMP recorded. Patient has had a hysterectomy.  Obstetric History: No obstetric history on file.  Social History   Socioeconomic History   Marital status: Married    Spouse name: WGwyndolyn Saxon  Number of children: 2   Years of education: high school   Highest education level: Not on file  Occupational History   Not on file  Tobacco Use   Smoking status: Never   Smokeless tobacco: Never  Vaping Use   Vaping Use: Never used  Substance and Sexual Activity   Alcohol use: No   Drug use: No    Sexual activity: Not Currently  Other Topics Concern   Not on file  Social History Narrative   04/20/20   From: the area   Living: with son - DSimona Huhand husband is in long term care   Work: part-time 2 days a week, making window treatments      Family: DSimona Huhand RShirlean Mylar- 2 grandchildren, and 1 adopted grandchildren       Enjoys: work is fun it is with friends      Exercise: walking, bending at work   Diet: tries to eat healthy      Safety   Seat belts:  Yes    Guns: Yes  and secure   Safe in relationships: Yes             Social Determinants of Health   Financial Resource Strain: Low Risk  (11/16/2021)   Overall Financial Resource Strain (CARDIA)    Difficulty of Paying Living Expenses: Not hard at all  Food Insecurity: No Food Insecurity (11/16/2021)   Hunger Vital Sign    Worried About Running Out of Food in the Last Year: Never true    Ran Out of Food in the Last Year: Never true  Transportation Needs: No Transportation Needs (11/16/2021)   PRAPARE - Hydrologist (Medical): No    Lack of Transportation (Non-Medical): No  Physical Activity: Inactive (11/16/2021)   Exercise Vital Sign    Days of Exercise per Week: 0 days    Minutes of Exercise per Session: 0 min  Stress: No Stress Concern Present (11/16/2021)   [place]    Feeling of Stress : Not at all  Social Connections: [place] (11/16/2021)   Social Connection and Isolation Panel [NHANES]    Frequency of Communication with Friends and Family: More than three times a week    Frequency of Social Gatherings with Friends and Family: More than three times a week    Attends Religious Services: More than 4 times per year    Active Member of [employer] or Organizations: Yes    Attends Music therapist: More than 4 times per year    Marital Status: Married  Human resources officer Violence: Not At Risk (11/16/2021)    Humiliation, Afraid, Rape, and Kick questionnaire    Fear of Current or Ex-Partner: No    Emotionally Abused: No    Physically Abused: No    Sexually Abused: No    Family History  Problem Relation Age of Onset   Aneurysm Mother    Stroke Father    Hypertension Father    Hypertension Brother    Heart attack Brother    Lupus Sister    Osteoporosis Sister    Diabetes Paternal Grandfather    Breast cancer Paternal Aunt    Colon cancer Neg Hx     Review of Systems  Constitutional:  Negative for chills and fever.  HENT:  Negative for congestion and sore throat.   Eyes:  Negative for blurred vision and double vision.  Respiratory:  Negative for shortness of breath.   Cardiovascular:  Negative for chest pain.  Gastrointestinal:  Negative for heartburn, nausea and vomiting.  Genitourinary: Negative.   Musculoskeletal: Negative.  Negative for myalgias.  Skin:  Negative for rash.  Neurological:  Negative for dizziness and headaches.  Endo/Heme/Allergies:  Does not bruise/bleed easily.  Psychiatric/Behavioral:  Negative for depression. The patient is not nervous/anxious.      Physical Exam BP 110/74   Pulse 82   Temp (!) 97.3 F (36.3 C) (Oral)   Wt 100 lb 12.8 oz (45.7 kg)   SpO2 98%   BMI 19.05 kg/m    BP Readings from Last 3 Encounters:  11/16/21 110/74  07/28/21 130/77  07/19/21 110/60      Physical Exam Constitutional:      General: She is not in acute distress.    Appearance: She is well-developed. She is not diaphoretic.  HENT:     Head: Normocephalic and atraumatic.     Right Ear: External ear normal.     Left  Ear: External ear normal.     Nose: Nose normal.  Eyes:     General: No scleral icterus.    Extraocular Movements: Extraocular movements intact.     Conjunctiva/sclera: Conjunctivae normal.  Cardiovascular:     Rate and Rhythm: Normal rate and regular rhythm.     Heart sounds: No murmur heard. Pulmonary:     Effort: Pulmonary effort is normal.  No respiratory distress.     Breath sounds: Normal breath sounds. No wheezing.  Abdominal:     General: Bowel sounds are normal. There is no distension.     Palpations: Abdomen is soft. There is no mass.     Tenderness: There is no abdominal tenderness. There is no guarding or rebound.  Musculoskeletal:        General: Normal range of motion.     Cervical back: Neck supple.  Lymphadenopathy:     Cervical: No cervical adenopathy.  Skin:    General: Skin is warm and dry.     Capillary Refill: Capillary refill takes less than 2 seconds.  Neurological:     Mental Status: She is alert and oriented to person, place, and time.     Deep Tendon Reflexes: Reflexes normal.  Psychiatric:        Mood and Affect: Mood normal.        Behavior: Behavior normal.     Results:  PHQ-9:  [place] [facility] Visit from 11/16/2021 in [place] [facility] [place]  PHQ-9 Total Score 0         Assessment: 76 y.o. No obstetric history on file. female here for routine annual physical examination.  Plan: Problem List Items Addressed This Visit       Other   HLD (hyperlipidemia)   Relevant Orders   Lipid panel   Other Visit Diagnoses     Annual physical exam    -  Primary   Encounter for screening mammogram for malignant neoplasm of breast       Relevant Orders   MM 3D SCREEN BREAST BILATERAL   Screening for colon cancer       Relevant Orders   Fecal occult blood, imunochemical       Screening: -- Blood pressure screen normal -- cholesterol screening: will obtain -- Weight screening: normal -- Diabetes Screening: will obtain -- Nutrition: Encouraged healthy diet  The 10-year ASCVD risk score (Arnett DK, et al., 2019) is: 12.1%   Values used to calculate the score:     Age: 31 years     Sex: Female     Is Non-Hispanic African American: No     Diabetic: No     Tobacco smoker: No     Systolic Blood Pressure: 324 mmHg     Is BP treated: No     HDL Cholesterol: 67.4  mg/dL     Total Cholesterol: 182 mg/dL  -- Statin therapy for Age 48-75 with CVD risk >7.5%  Psych -- Depression screening (PHQ-9):  [place] Visit from 11/16/2021 in [place] at [facility]  PHQ-9 Total Score 0        Safety -- tobacco screening: not using -- alcohol screening:  low-risk usage. -- no evidence of domestic violence or intimate partner violence.   Cancer Screening -- pap smear not collected per ASCCP guidelines -- family history of breast cancer screening: done. not at high risk. -- Mammogram - ordered -- Colon cancer (age 54+)-- ordered  Immunizations Immunization History  Administered Date(s) Administered  Fluad Quad(high Dose 65+) 11/09/2018, 12/14/2019, 11/12/2020   Influenza Split 01/31/2011, 12/15/2011   Influenza Whole 12/10/2007, 03/10/2009, 12/08/2009   Influenza,inj,Quad PF,6+ Mos 12/26/2012, 11/07/2013, 02/13/2015, 11/13/2015, 12/14/2017   Influenza-Unspecified 12/19/2016   PFIZER(Purple Top)SARS-COV-2 Vaccination 04/27/2019, 05/18/2019, 08/21/2020   Pneumococcal Conjugate-13 05/29/2014   Pneumococcal Polysaccharide-23 04/25/2011   Td 02/08/2006   Tdap 07/01/2016   Zoster Recombinat (Shingrix) 11/15/2019, 04/10/2020   Zoster, Live 07/30/2009    -- flu vaccine not up to date - will get high dose -- TDAP q10 years up to date -- Shingles (age >1) up to date -- PPSV-23 (19-64 with chronic disease or smoking) up to date -- PCV-13 (age >79) - one dose followed by PPSV-23 1 year later up to date -- Covid-19 Vaccine up to date   Encouraged healthy diet and exercise. Encouraged regular vision and dental care.    [name], MD

## 2021-11-16 NOTE — Progress Notes (Signed)
Subjective:   Kristen Caldwell is a 76 y.o. female who presents for Medicare Annual (Subsequent) preventive examination.  Review of Systems    Virtual Visit via Telephone Note  I connected with  Kristen Caldwell on 11/16/21 at  8:45 AM EDT by telephone and verified that I am speaking with the correct person using two identifiers.  Location: Patient: Home Provider: Office Persons participating in the virtual visit: patient/Nurse Health Advisor   I discussed the limitations, risks, security and privacy concerns of performing an evaluation and management service by telephone and the availability of in person appointments. The patient expressed understanding and agreed to proceed.  Interactive audio and video telecommunications were attempted between this nurse and patient, however failed, due to patient having technical difficulties OR patient did not have access to video capability.  We continued and completed visit with audio only.  Some vital signs may be absent or patient reported.   Kristen Peaches, LPN  Cardiac Risk Factors include: advanced age (>73mn, >>66women)     Objective:    Today's Vitals   11/16/21 0847  Weight: 101 lb (45.8 kg)  Height: '5\' 1"'$  (1.549 m)   Body mass index is 19.08 kg/m.     11/16/2021    8:56 AM 11/12/2020    2:15 PM 05/12/2020    9:26 AM 11/08/2019    3:37 PM 11/02/2018    9:03 AM  Advanced Directives  Does Patient Have a Medical Advance Directive? No No No No No  Would patient like information on creating a medical advance directive? No - Patient declined Yes (MAU/Ambulatory/Procedural Areas - Information given)  No - Patient declined     Current Medications (verified) Outpatient Encounter Medications as of 11/16/2021  Medication Sig   amitriptyline (ELAVIL) 25 MG tablet Take 1 tablet (25 mg total) by mouth at bedtime as needed. for sleep   Calcium Carbonate-Vit D-Min (CALTRATE 600+D PLUS) 600-400 MG-UNIT per tablet Take 2  tablets by mouth daily.   clorazepate (TRANXENE) 3.75 MG tablet Take 1 tablet by mouth once daily   estradiol (ESTRACE) 0.5 MG tablet Take 1/2 (0.'25mg'$ ) of a pill daily.   meclizine (ANTIVERT) 12.5 MG tablet Take 1 tablet (12.5 mg total) by mouth 3 (three) times daily as needed for dizziness.   Multiple Vitamins-Minerals (CENTRUM SILVER) CHEW Chew 1 tablet by mouth daily.   Omega-3 Fatty Acids (FISH OIL PO) Take 1 capsule by mouth 2 (two) times daily.   omeprazole (PRILOSEC) 20 MG capsule Take 1 capsule by mouth once daily   ondansetron (ZOFRAN) 8 MG tablet Take 1 tablet (8 mg total) by mouth every 8 (eight) hours as needed for nausea or vomiting.   topiramate (TOPAMAX) 25 MG tablet TAKE 1 TABLET(25 MG) BY MOUTH TWICE DAILY   TURMERIC PO Take by mouth.   vitamin B-12 (CYANOCOBALAMIN) 1000 MCG tablet Take 1,000 mcg by mouth daily.   No facility-administered encounter medications on file as of 11/16/2021.    Allergies (verified) Cefuroxime, Amoxicillin, Cephalexin, Penicillins, Sertraline, and Promethazine hcl   History: Past Medical History:  Diagnosis Date   Anxiety    Chronic headaches    COVID 03/03/2020   Depression    Diverticulosis    GERD (gastroesophageal reflux disease)    Osteoporosis    Past Surgical History:  Procedure Laterality Date   ABDOMINAL HYSTERECTOMY  03/14/1989   partial   COLONOSCOPY     TUBAL LIGATION     Family History  Problem Relation Age of Onset   Aneurysm Mother    Stroke Father    Hypertension Father    Hypertension Brother    Heart attack Brother    Lupus Sister    Osteoporosis Sister    Diabetes Paternal Grandfather    Breast cancer Paternal Aunt    Colon cancer Neg Hx    Social History   Socioeconomic History   Marital status: Married    Spouse name: Kristen Caldwell   Number of children: 2   Years of education: high school   Highest education level: Not on file  Occupational History   Not on file  Tobacco Use   Smoking status: Never    Smokeless tobacco: Never  Vaping Use   Vaping Use: Never used  Substance and Sexual Activity   Alcohol use: No   Drug use: No   Sexual activity: Not Currently  Other Topics Concern   Not on file  Social History Narrative   04/20/20   From: the area   Living: with son - Kristen Caldwell and husband is in long term care   Work: part-time 2 days a week, making window treatments      Family: Kristen Caldwell and Kristen Caldwell - 2 grandchildren, and 1 adopted grandchildren       Enjoys: work is fun it is with friends      Exercise: walking, bending at work   Diet: tries to eat healthy      Safety   Seat belts: Yes    Guns: Yes  and secure   Safe in relationships: Yes             Social Determinants of Health   Financial Resource Strain: Low Risk  (11/16/2021)   Overall Financial Resource Strain (CARDIA)    Difficulty of Paying Living Expenses: Not hard at all  Food Insecurity: No Food Insecurity (11/16/2021)   Hunger Vital Sign    Worried About Running Out of Food in the Last Year: Never true    [place] in the Last Year: Never true  Transportation Needs: No Transportation Needs (11/16/2021)   PRAPARE - Hydrologist (Medical): No    Lack of Transportation (Non-Medical): No  Physical Activity: Inactive (11/16/2021)   Exercise Vital Sign    Days of Exercise per Week: 0 days    Minutes of Exercise per Session: 0 min  Stress: No Stress Concern Present (11/16/2021)   [place]    Feeling of Stress : Not at all  Social Connections: [place] (11/16/2021)   Social Connection and Isolation Panel [NHANES]    Frequency of Communication with Friends and Family: More than three times a week    Frequency of Social Gatherings with Friends and Family: More than three times a week    Attends Religious Services: More than 4 times per year    Active Member of [employer] or Organizations: Yes    Attends Programme researcher, broadcasting/film/video: More than 4 times per year    Marital Status: Married    Tobacco Counseling Counseling given: Not Answered   Clinical Intake:  Pre-visit preparation completed: No  Pain : No/denies pain     BMI - recorded: 19.09 Nutritional Status: BMI of 19-24  Normal Nutritional Risks: None Diabetes: No  How often do you need to have someone help you when you read instructions, pamphlets, or other written materials from your doctor or pharmacy?: 1 -  Never  Diabetic?  No  Interpreter Needed?: No  Information entered by :: Rolene Arbour LPN   Activities of Daily Living    11/16/2021    8:54 AM  In your present state of health, do you have any difficulty performing the following activities:  Hearing? 0  Vision? 0  Difficulty concentrating or making decisions? 0  Walking or climbing stairs? 0  Dressing or bathing? 0  Doing errands, shopping? 0  Preparing Food and eating ? N  Using the Toilet? N  In the past six months, have you accidently leaked urine? N  Do you have problems with loss of bowel control? N  Managing your Medications? N  Managing your Finances? N  Housekeeping or managing your Housekeeping? N    Patient Care Team: [name], MD as PCP - General (Family Medicine) Renato Shin, MD (Inactive) as Consulting Physician (Endocrinology) Charlton Haws, [facility] as Pharmacist (Pharmacist)  Indicate any recent Medical Services you may have received from other than [facility] providers in the past year (date may be approximate).     Assessment:   This is a routine wellness examination for [place].  Hearing/Vision screen Hearing Screening - Comments:: Denies hearing difficulties   Vision Screening - Comments:: Ears reading glasses Followed by [facility]  Dietary issues and exercise activities discussed: Current Exercise Habits: The patient does not participate in regular exercise at present, Exercise limited by: None identified    Goals Addressed               This Visit's Progress     Stay Healthy (pt-stated)         Depression Screen    11/12/2020    3:15 PM 11/08/2019    3:38 PM 11/02/2018    9:04 AM 07/14/2017    9:26 AM 06/29/2016    7:44 AM 05/29/2014    7:36 AM 05/23/2013    8:33 AM  PHQ 2/9 Scores  PHQ - 2 Score 1 2 0 0 1 0 0  PHQ- 9 Score '2 2 1        '$ Fall Risk    11/16/2021    8:54 AM 11/12/2020    1:57 PM 11/11/2019    9:39 AM 11/08/2019    3:38 PM 11/02/2018    9:04 AM  [place] in the past year? 0 0 0 0 0  Number falls in past yr: 0 0  0   Injury with Fall? 0   0   Risk for fall due to : No Fall Risks   Medication side effect Medication side effect  Follow up Falls prevention discussed   Falls evaluation completed;Falls prevention discussed Falls evaluation completed;Falls prevention discussed    FALL RISK PREVENTION PERTAINING TO THE HOME:  Any stairs in or around the home? Yes  If so, are there any without handrails? No  Home free of loose throw rugs in walkways, pet beds, electrical cords, etc? Yes  Adequate lighting in your home to reduce risk of falls? Yes   ASSISTIVE DEVICES UTILIZED TO PREVENT FALLS:  Life alert? No  Use of a cane, walker or w/c? No  Grab bars in the bathroom? Yes  Shower chair or bench in shower? Yes  Elevated toilet seat or a handicapped toilet? Yes   TIMED UP AND GO:  Was the test performed? No . Audio Visit  Cognitive Function:    11/08/2019    3:43 PM 11/02/2018    9:08 AM  MMSE - Mini Mental State Exam  Not completed: Refused   Orientation to time  4  Orientation to Place  5  Registration  3  Attention/ Calculation  5  Recall  3  Language- name 2 objects  0  Language- repeat  1  Language- follow 3 step command  0  Language- read & follow direction  0  Write a sentence  0  Copy design  0  Total score  21        11/16/2021    8:56 AM  6CIT Screen  What Year? 0 points  What month? 0 points  What time? 0 points  Count back from  20 0 points  Months in reverse 0 points  Repeat phrase 0 points  Total Score 0 points    Immunizations Immunization History  Administered Date(s) Administered   Fluad Quad(high Dose 65+) 11/09/2018, 12/14/2019, 11/12/2020   Influenza Split 01/31/2011, 12/15/2011   Influenza Whole 12/10/2007, 03/10/2009, 12/08/2009   Influenza,inj,Quad PF,6+ Mos 12/26/2012, 11/07/2013, 02/13/2015, 11/13/2015, 12/14/2017   Influenza-Unspecified 12/19/2016   PFIZER(Purple Top)SARS-COV-2 Vaccination 04/27/2019, 05/18/2019, 08/21/2020   Pneumococcal Conjugate-13 05/29/2014   Pneumococcal Polysaccharide-23 04/25/2011   Td 02/08/2006   Tdap 07/01/2016   Zoster Recombinat (Shingrix) 11/15/2019, 04/10/2020   Zoster, Live 07/30/2009      Flu Vaccine status: Up to date  Pneumococcal vaccine status: Up to date  Covid-19 vaccine status: Completed vaccines  Qualifies for Shingles Vaccine? Yes   Zostavax completed Yes   Shingrix Completed?: Yes  Screening Tests Health Maintenance  Topic Date Due   COVID-19 Vaccine (4 - Pfizer series) 12/02/2021 (Originally 10/16/2020)   INFLUENZA VACCINE  06/12/2022 (Originally 10/12/2021)   COLONOSCOPY (Pts 45-40yr Insurance coverage will need to be confirmed)  11/17/2022 (Originally 05/11/2020)   MAMMOGRAM  12/07/2021   TETANUS/TDAP  07/02/2026   Pneumonia Vaccine 75 Years old  Completed   DEXA SCAN  Completed   Hepatitis C Screening  Completed   Zoster Vaccines- Shingrix  Completed   HPV VACCINES  Aged Out    Health Maintenance  There are no preventive care reminders to display for this patient.   Colorectal cancer screening: No longer required.   Mammogram status: Completed 11/21/20. Repeat every year  Bone Density status: Completed 11/30/19. Results reflect: Bone density results: OSTEOPOROSIS. Repeat every   years.  Lung Cancer Screening: (Low Dose CT Chest recommended if Age 75-80years, 30 pack-year currently smoking OR have quit w/in 15years.) does  not qualify.    Additional Screening:  Hepatitis C Screening: does qualify; Completed 11/12/20  Vision Screening: Recommended annual ophthalmology exams for early detection of glaucoma and other disorders of the eye. Is the patient up to date with their annual eye exam?  Yes  Who is the provider or what is the name of the office in which the patient attends annual eye exams? [facility] pt is not established with a provider, would they like to be referred to a provider to establish care? No .   Dental Screening: Recommended annual dental exams for proper oral hygiene  Community Resource Referral / Chronic Care Management:  CRR required this visit?  No   CCM required this visit?  No      Plan:     I have personally reviewed and noted the following in the patient's chart:   Medical and social history Use of alcohol, tobacco or illicit drugs  Current medications and supplements including opioid prescriptions. Patient is not currently taking opioid  prescriptions. Functional ability and status Nutritional status Physical activity Advanced directives List of other physicians Hospitalizations, surgeries, and ER visits in previous 12 months Vitals Screenings to include cognitive, depression, and falls Referrals and appointments  In addition, I have reviewed and discussed with patient certain preventive protocols, quality metrics, and best practice recommendations. A written personalized care plan for preventive services as well as general preventive health recommendations were provided to patient.     Kristen Peaches, LPN   06/17/9975   Nurse Notes: None

## 2021-11-16 NOTE — Telephone Encounter (Signed)
Will route to lab to see if this is possible.

## 2021-11-18 ENCOUNTER — Telehealth: Payer: Self-pay | Admitting: Radiology

## 2021-11-18 DIAGNOSIS — R195 Other fecal abnormalities: Secondary | ICD-10-CM

## 2021-11-18 LAB — FECAL OCCULT BLOOD, IMMUNOCHEMICAL: Fecal Occult Bld: POSITIVE — AB

## 2021-11-18 NOTE — Telephone Encounter (Signed)
Please call pt to relay positive stool test and ask if she would like to see GI in [place] or [place]

## 2021-11-18 NOTE — Addendum Note (Signed)
Addended by: [name] on: 11/18/2021 08:18 PM   Modules accepted: Orders

## 2021-11-18 NOTE — Telephone Encounter (Signed)
[facility] lab called a positive ifob result. Results given to Dr Damita Dunnings and sent to Dr Einar Pheasant

## 2021-11-19 ENCOUNTER — Telehealth: Payer: Self-pay

## 2021-11-19 ENCOUNTER — Other Ambulatory Visit: Payer: Self-pay

## 2021-11-19 DIAGNOSIS — R195 Other fecal abnormalities: Secondary | ICD-10-CM

## 2021-11-19 MED ORDER — NA SULFATE-K SULFATE-MG SULF 17.5-3.13-1.6 GM/177ML PO SOLN: 354.0000 mL | Freq: Once | ORAL | 0 refills | Status: AC

## 2021-11-19 NOTE — Telephone Encounter (Signed)
Gastroenterology Pre-Procedure Review  Request Date: 12/14/2021 Requesting Physician: Dr. Marius Ditch   PATIENT REVIEW QUESTIONS: The patient responded to the following health history questions as indicated:    1. Are you having any GI issues? Positive fecal immunochemical test 2. Do you have a personal history of Polyps? no 3. Do you have a family history of Colon Cancer or Polyps? no 4. Diabetes Mellitus? no 5. Joint replacements in the past 12 months?no 6. Major health problems in the past 3 months?no 7. Any artificial heart valves, MVP, or defibrillator?no    MEDICATIONS & ALLERGIES:    Patient reports the following regarding taking any anticoagulation/antiplatelet therapy:   Plavix, Coumadin, Eliquis, Xarelto, Lovenox, Pradaxa, Brilinta, or Effient? no Aspirin? no  Patient confirms/reports the following medications:  Current Outpatient Medications  Medication Sig Dispense Refill   amitriptyline (ELAVIL) 25 MG tablet Take 1 tablet (25 mg total) by mouth at bedtime as needed. for sleep 90 tablet 1   Calcium Carbonate-Vit D-Min (CALTRATE 600+D PLUS) 600-400 MG-UNIT per tablet Take 2 tablets by mouth daily.     clorazepate (TRANXENE) 3.75 MG tablet Take 1 tablet by mouth once daily 90 tablet 0   estradiol (ESTRACE) 0.5 MG tablet Take 1/2 (0.'25mg'$ ) of a pill daily. 90 tablet 1   meclizine (ANTIVERT) 12.5 MG tablet Take 1 tablet (12.5 mg total) by mouth 3 (three) times daily as needed for dizziness. 30 tablet 0   Multiple Vitamins-Minerals (CENTRUM SILVER) CHEW Chew 1 tablet by mouth daily.     Omega-3 Fatty Acids (FISH OIL PO) Take 1 capsule by mouth 2 (two) times daily.     omeprazole (PRILOSEC) 20 MG capsule Take 1 capsule by mouth once daily 90 capsule 1   ondansetron (ZOFRAN) 8 MG tablet Take 1 tablet (8 mg total) by mouth every 8 (eight) hours as needed for nausea or vomiting. 30 tablet 2   topiramate (TOPAMAX) 25 MG tablet TAKE 1 TABLET(25 MG) BY MOUTH TWICE DAILY 180 tablet 2    TURMERIC PO Take by mouth.     vitamin B-12 (CYANOCOBALAMIN) 1000 MCG tablet Take 1,000 mcg by mouth daily.     No current facility-administered medications for this visit.    Patient confirms/reports the following allergies:  Allergies  Allergen Reactions   Cefuroxime Nausea Only   Amoxicillin     REACTION: NAUSEA   Cephalexin     REACTION: NAUSEA AND VOMITING   Penicillins     REACTION: TOOK TABLETS WITHOUT PROBLEM   Sertraline Diarrhea   Promethazine Hcl Rash    No orders of the defined types were placed in this encounter.   AUTHORIZATION INFORMATION Primary Insurance: 1D#: Group #:  Secondary Insurance: 1D#: Group #:  SCHEDULE INFORMATION: Date:  Time: Location:

## 2021-11-19 NOTE — Telephone Encounter (Signed)
Will defer to PCP.  Thanks.

## 2021-11-23 ENCOUNTER — Ambulatory Visit (INDEPENDENT_AMBULATORY_CARE_PROVIDER_SITE_OTHER): Payer: Medicare Other

## 2021-11-23 DIAGNOSIS — Z23 Encounter for immunization: Secondary | ICD-10-CM

## 2021-12-02 ENCOUNTER — Telehealth: Payer: Self-pay | Admitting: Gastroenterology

## 2021-12-02 NOTE — Telephone Encounter (Signed)
Patient left vm wanting to go over medication during colonoscopy prep. Requesting call back.

## 2021-12-02 NOTE — Telephone Encounter (Signed)
Patient was calling to find out if she needed to take her medications the morning of her procedure. Informed patient no do not take them till after her procedure

## 2021-12-09 NOTE — Telephone Encounter (Signed)
Patient contacted office to inquire if she can still take her Curcumin.  I've advised her to hold her Curcumin because its an herbal supplement and the reddish brown coloring could stain the inside of her colon.  Also advised for her to hold all the other supplements.   Thanks,  [place], [place]

## 2021-12-14 ENCOUNTER — Ambulatory Visit
Admission: RE | Admit: 2021-12-14 | Discharge: 2021-12-14 | Disposition: A | Discharge status: Home / Self Care | Payer: Medicare Other | Attending: Gastroenterology | Admitting: Gastroenterology

## 2021-12-14 ENCOUNTER — Ambulatory Visit: Payer: Medicare Other | Admitting: Anesthesiology

## 2021-12-14 ENCOUNTER — Encounter
Admission: RE | Disposition: A | Discharge status: Home / Self Care | Payer: Self-pay | Source: Home / Self Care | Attending: Gastroenterology

## 2021-12-14 DIAGNOSIS — Z8616 Personal history of COVID-19: Secondary | ICD-10-CM | POA: Insufficient documentation

## 2021-12-14 DIAGNOSIS — F419 Anxiety disorder, unspecified: Secondary | ICD-10-CM | POA: Insufficient documentation

## 2021-12-14 DIAGNOSIS — R195 Other fecal abnormalities: Secondary | ICD-10-CM | POA: Diagnosis not present

## 2021-12-14 DIAGNOSIS — F32A Depression, unspecified: Secondary | ICD-10-CM | POA: Diagnosis not present

## 2021-12-14 DIAGNOSIS — D123 Benign neoplasm of transverse colon: Secondary | ICD-10-CM | POA: Diagnosis not present

## 2021-12-14 DIAGNOSIS — K5790 Diverticulosis of intestine, part unspecified, without perforation or abscess without bleeding: Secondary | ICD-10-CM | POA: Diagnosis not present

## 2021-12-14 DIAGNOSIS — E785 Hyperlipidemia, unspecified: Secondary | ICD-10-CM | POA: Diagnosis not present

## 2021-12-14 DIAGNOSIS — D126 Benign neoplasm of colon, unspecified: Secondary | ICD-10-CM | POA: Diagnosis not present

## 2021-12-14 DIAGNOSIS — K635 Polyp of colon: Secondary | ICD-10-CM | POA: Diagnosis not present

## 2021-12-14 DIAGNOSIS — Z1211 Encounter for screening for malignant neoplasm of colon: Secondary | ICD-10-CM | POA: Insufficient documentation

## 2021-12-14 DIAGNOSIS — R519 Headache, unspecified: Secondary | ICD-10-CM | POA: Diagnosis not present

## 2021-12-14 DIAGNOSIS — K219 Gastro-esophageal reflux disease without esophagitis: Secondary | ICD-10-CM | POA: Diagnosis not present

## 2021-12-14 DIAGNOSIS — K5730 Diverticulosis of large intestine without perforation or abscess without bleeding: Secondary | ICD-10-CM | POA: Insufficient documentation

## 2021-12-14 HISTORY — PX: COLONOSCOPY WITH PROPOFOL: SHX5780

## 2021-12-14 SURGERY — COLONOSCOPY WITH PROPOFOL
Anesthesia: General

## 2021-12-14 MED ORDER — SODIUM CHLORIDE 0.9 % IV SOLN: INTRAVENOUS | Status: DC

## 2021-12-14 MED ORDER — PHENYLEPHRINE HCL (PRESSORS) 10 MG/ML IV SOLN
INTRAVENOUS | Status: DC | PRN
  Administered 2021-12-14 (×2): 160 ug via INTRAVENOUS
  Administered 2021-12-14 (×3): 80 ug via INTRAVENOUS

## 2021-12-14 MED ORDER — METOPROLOL TARTRATE 5 MG/5ML IV SOLN
INTRAVENOUS | Status: AC
  Filled 2021-12-14: qty 5

## 2021-12-14 MED ORDER — PROPOFOL 10 MG/ML IV BOLUS
INTRAVENOUS | Status: DC | PRN
  Administered 2021-12-14: 20 mg via INTRAVENOUS
  Administered 2021-12-14: 10 mg via INTRAVENOUS
  Administered 2021-12-14: 40 mg via INTRAVENOUS

## 2021-12-14 MED ORDER — PHENYLEPHRINE 80 MCG/ML (10ML) SYRINGE FOR IV PUSH (FOR BLOOD PRESSURE SUPPORT)
PREFILLED_SYRINGE | INTRAVENOUS | Status: AC
  Filled 2021-12-14: qty 10

## 2021-12-14 MED ORDER — EPHEDRINE SULFATE (PRESSORS) 50 MG/ML IJ SOLN
INTRAMUSCULAR | Status: DC | PRN
  Administered 2021-12-14: 5 mg via INTRAVENOUS
  Administered 2021-12-14: 10 mg via INTRAVENOUS
  Administered 2021-12-14: 5 mg via INTRAVENOUS

## 2021-12-14 MED ORDER — EPHEDRINE 5 MG/ML INJ
INTRAVENOUS | Status: AC
  Filled 2021-12-14: qty 5

## 2021-12-14 MED ORDER — VASOPRESSIN 20 UNIT/ML IV SOLN
INTRAVENOUS | Status: AC
  Filled 2021-12-14: qty 1

## 2021-12-14 MED ORDER — PROPOFOL 500 MG/50ML IV EMUL
INTRAVENOUS | Status: DC | PRN
  Administered 2021-12-14: 150 ug/kg/min via INTRAVENOUS

## 2021-12-14 MED ORDER — LIDOCAINE HCL (CARDIAC) PF 100 MG/5ML IV SOSY
PREFILLED_SYRINGE | INTRAVENOUS | Status: DC | PRN
  Administered 2021-12-14: 40 mg via INTRAVENOUS
  Administered 2021-12-14: 60 mg via INTRAVENOUS

## 2021-12-14 MED ORDER — VASOPRESSIN 20 UNIT/ML IV SOLN
INTRAVENOUS | Status: DC | PRN
  Administered 2021-12-14: .5 [IU] via INTRAVENOUS
  Administered 2021-12-14: 1 [IU] via INTRAVENOUS

## 2021-12-14 NOTE — H&P (Signed)
Cephas Darby, MD [address]  [place]  [place], [place] [address]  Main: [phone]  Fax: [phone] Pager: [phone]  Primary Care Physician:  [name], MD Primary Gastroenterologist:  Dr. Cephas Darby  Pre-Procedure History & Physical: HPI:  Kristen Caldwell is a 76 y.o. female is here for an colonoscopy.   Past Medical History:  Diagnosis Date   Anxiety    Chronic headaches    COVID 03/03/2020   Depression    Diverticulosis    GERD (gastroesophageal reflux disease)    Osteoporosis     Past Surgical History:  Procedure Laterality Date   ABDOMINAL HYSTERECTOMY  03/14/1989   partial   COLONOSCOPY     TUBAL LIGATION      Prior to Admission medications   Medication Sig Start Date End Date Taking? Authorizing Provider  amitriptyline (ELAVIL) 25 MG tablet Take 1 tablet (25 mg total) by mouth at bedtime as needed. for sleep 07/19/21   [name], MD  Calcium Carbonate-Vit D-Min (CALTRATE 600+D PLUS) 600-400 MG-UNIT per tablet Take 2 tablets by mouth daily.    [provider]  clorazepate (TRANXENE) 3.75 MG tablet Take 1 tablet by mouth once daily 11/01/21   [name], MD  estradiol (ESTRACE) 0.5 MG tablet Take 1/2 (0.'25mg'$ ) of a pill daily. 06/29/21   [name], MD  meclizine (ANTIVERT) 12.5 MG tablet Take 1 tablet (12.5 mg total) by mouth 3 (three) times daily as needed for dizziness. 06/16/21   [name], MD  Multiple Vitamins-Minerals (CENTRUM SILVER) CHEW Chew 1 tablet by mouth daily.    [provider]  Omega-3 Fatty Acids (FISH OIL PO) Take 1 capsule by mouth 2 (two) times daily.    [provider]  omeprazole (PRILOSEC) 20 MG capsule Take 1 capsule by mouth once daily 07/12/21   [name], MD  ondansetron (ZOFRAN) 8 MG tablet Take 1 tablet (8 mg total) by mouth every 8 (eight) hours as needed for nausea or vomiting. 06/14/21   [name], MD  topiramate (TOPAMAX) 25 MG tablet TAKE 1  TABLET(25 MG) BY MOUTH TWICE DAILY 07/19/21   [name], MD  TURMERIC PO Take by mouth.    [provider]  vitamin B-12 (CYANOCOBALAMIN) 1000 MCG tablet Take 1,000 mcg by mouth daily.    [provider]    Allergies as of 11/19/2021 - Review Complete 11/16/2021  Allergen Reaction Noted   Cefuroxime Nausea Only 07/19/2021   Amoxicillin  08/25/2006   Cephalexin  03/05/2007   Penicillins  08/25/2006   Sertraline Diarrhea 05/03/2019   Promethazine hcl Rash 11/01/2010    Family History  Problem Relation Age of Onset   Aneurysm Mother    Stroke Father    Hypertension Father    Hypertension Brother    Heart attack Brother    Lupus Sister    Osteoporosis Sister    Diabetes Paternal Grandfather    Breast cancer Paternal Aunt    Colon cancer Neg Hx     Social History   Socioeconomic History   Marital status: Married    Spouse name: Gwyndolyn Saxon   Number of children: 2   Years of education: high school   Highest education level: Not on file  Occupational History   Not on file  Tobacco Use   Smoking status: Never   Smokeless tobacco: Never  Vaping Use   Vaping Use: Never used  Substance and Sexual  Activity   Alcohol use: No   Drug use: No   Sexual activity: Not Currently  Other Topics Concern   Not on file  Social History Narrative   04/20/20   From: the area   Living: with son - Simona Huh and husband is in long term care   Work: part-time 2 days a week, making window treatments      Family: Simona Huh and Shirlean Mylar - 2 grandchildren, and 1 adopted grandchildren       Enjoys: work is fun it is with friends      Exercise: walking, bending at work   Diet: tries to eat healthy      Safety   Seat belts: Yes    Guns: Yes  and secure   Safe in relationships: Yes             Social Determinants of Health   Financial Resource Strain: Low Risk  (11/16/2021)   Overall Financial Resource Strain (CARDIA)    Difficulty of Paying Living Expenses: Not hard at all   Food Insecurity: No Food Insecurity (11/16/2021)   Hunger Vital Sign    Worried About Running Out of Food in the Last Year: Never true    [place] in the Last Year: Never true  Transportation Needs: No Transportation Needs (11/16/2021)   PRAPARE - Hydrologist (Medical): No    Lack of Transportation (Non-Medical): No  Physical Activity: Inactive (11/16/2021)   Exercise Vital Sign    Days of Exercise per Week: 0 days    Minutes of Exercise per Session: 0 min  Stress: No Stress Concern Present (11/16/2021)   [place]    Feeling of Stress : Not at all  Social Connections: [place] (11/16/2021)   Social Connection and Isolation Panel [NHANES]    Frequency of Communication with Friends and Family: More than three times a week    Frequency of Social Gatherings with Friends and Family: More than three times a week    Attends Religious Services: More than 4 times per year    Active Member of [employer] or Organizations: Yes    Attends Music therapist: More than 4 times per year    Marital Status: Married  Human resources officer Violence: Not At Risk (11/16/2021)   Humiliation, Afraid, Rape, and Kick questionnaire    Fear of Current or Ex-Partner: No    Emotionally Abused: No    Physically Abused: No    Sexually Abused: No    Review of Systems: See HPI, otherwise negative ROS  Physical Exam: BP (!) 131/96   Pulse (!) 115   Temp (!) 97 F (36.1 C)   Resp 18   Ht '5\' 1"'$  (1.549 m)   Wt 45.8 kg   SpO2 100%   BMI 19.08 kg/m  General:   Alert,  pleasant and cooperative in NAD Head:  Normocephalic and atraumatic. Neck:  Supple; no masses or thyromegaly. Lungs:  Clear throughout to auscultation.    Heart:  Regular rate and rhythm. Abdomen:  Soft, nontender and nondistended. Normal bowel sounds, without guarding, and without rebound.   Neurologic:  Alert and  oriented x4;   grossly normal neurologically.  Impression/Plan: Kristen Caldwell is here for an colonoscopy to be performed for FIT positive  Risks, benefits, limitations, and alternatives regarding  colonoscopy have been reviewed with the patient.  Questions have been answered.  All parties agreeable.  Sherri Sear, MD  12/14/2021, 10:24 AM

## 2021-12-14 NOTE — Anesthesia Preprocedure Evaluation (Signed)
Anesthesia Evaluation  Patient identified by MRN, date of birth, ID band Patient awake    Reviewed: Allergy & Precautions, NPO status , Patient's Chart, lab work & pertinent test results  History of Anesthesia Complications Negative for: history of anesthetic complications  Airway Mallampati: III  TM Distance: <3 FB Neck ROM: full    Dental  (+) Chipped   Pulmonary neg pulmonary ROS, neg shortness of breath,    Pulmonary exam normal        Cardiovascular Exercise Tolerance: Good (-) anginanegative cardio ROS Normal cardiovascular exam     Neuro/Psych  Headaches, negative psych ROS   GI/Hepatic Neg liver ROS, GERD  Controlled,  Endo/Other  negative endocrine ROS  Renal/GU Renal disease  negative genitourinary   Musculoskeletal   Abdominal   Peds  Hematology negative hematology ROS (+)   Anesthesia Other Findings Past Medical History: No date: Anxiety No date: Chronic headaches 03/03/2020: COVID No date: Depression No date: Diverticulosis No date: GERD (gastroesophageal reflux disease) No date: Osteoporosis  Past Surgical History: 03/14/1989: ABDOMINAL HYSTERECTOMY     Comment:  partial No date: COLONOSCOPY No date: TUBAL LIGATION  BMI    Body Mass Index: 19.08 kg/m      Reproductive/Obstetrics negative OB ROS                             Anesthesia Physical Anesthesia Plan  ASA: 2  Anesthesia Plan: General   Post-op Pain Management:    Induction: Intravenous  PONV Risk Score and Plan: Propofol infusion and TIVA  Airway Management Planned: Natural Airway and Nasal Cannula  Additional Equipment:   Intra-op Plan:   Post-operative Plan:   Informed Consent: I have reviewed the patients History and Physical, chart, labs and discussed the procedure including the risks, benefits and alternatives for the proposed anesthesia with the patient or authorized representative  who has indicated his/her understanding and acceptance.     Dental Advisory Given  Plan Discussed with: Anesthesiologist, CRNA and Surgeon  Anesthesia Plan Comments: (Patient consented for risks of anesthesia including but not limited to:  - adverse reactions to medications - risk of airway placement if required - damage to eyes, teeth, lips or other oral mucosa - nerve damage due to positioning  - sore throat or hoarseness - Damage to heart, brain, nerves, lungs, other parts of body or loss of life  Patient voiced understanding.)        Anesthesia Quick Evaluation

## 2021-12-14 NOTE — Op Note (Signed)
[facility] Gastroenterology Patient Name: Kristen Caldwell Procedure Date: 12/14/2021 10:24 AM MRN: [id number] Account #: [id number] Date of Birth: 1945/09/20 Admit Type: Outpatient Age: 76 Room: [facility] ENDO ROOM 3 Gender: Female Note Status: Finalized Instrument Name: Colonoscope [id number] Procedure:             Colonoscopy Indications:           This is the patient's first colonoscopy, Positive                         fecal immunochemical test Providers:             Lin Landsman MD, MD Referring MD:          Jobe Marker. Einar Pheasant (Referring MD) Medicines:             General Anesthesia Complications:         No immediate complications. Estimated blood loss: None. Procedure:             Pre-Anesthesia Assessment:                        - Prior to the procedure, a History and Physical was                         performed, and patient medications and allergies were                         reviewed. The patient is competent. The risks and                         benefits of the procedure and the sedation options and                         risks were discussed with the patient. All questions                         were answered and informed consent was obtained.                         Patient identification and proposed procedure were                         verified by the physician, the nurse, the                         anesthesiologist, the anesthetist and the technician                         in the pre-procedure area in the procedure room in the                         endoscopy suite. Mental Status Examination: alert and                         oriented. Airway Examination: normal oropharyngeal                         airway and neck mobility. Respiratory Examination:  clear to auscultation. CV Examination: normal.                         Prophylactic Antibiotics: The patient does not require                         prophylactic  antibiotics. Prior Anticoagulants: The                         patient has taken no previous anticoagulant or                         antiplatelet agents. ASA Grade Assessment: II - A                         patient with mild systemic disease. After reviewing                         the risks and benefits, the patient was deemed in                         satisfactory condition to undergo the procedure. The                         anesthesia plan was to use general anesthesia.                         Immediately prior to administration of medications,                         the patient was re-assessed for adequacy to receive                         sedatives. The heart rate, respiratory rate, oxygen                         saturations, blood pressure, adequacy of pulmonary                         ventilation, and response to care were monitored                         throughout the procedure. The physical status of the                         patient was re-assessed after the procedure.                        After obtaining informed consent, the colonoscope was                         passed under direct vision. Throughout the procedure,                         the patient's blood pressure, pulse, and oxygen                         saturations were monitored continuously. The  Colonoscope was introduced through the anus and                         advanced to the the cecum, identified by appendiceal                         orifice and ileocecal valve. The colonoscopy was                         performed without difficulty. The patient tolerated                         the procedure well. The quality of the bowel                         preparation was evaluated using the BBPS [facility] Bowel                         Preparation Scale) with scores of: Right Colon = 3,                         Transverse Colon = 3 and Left Colon = 3 (entire mucosa                          seen well with no residual staining, small fragments                         of stool or opaque liquid). The total BBPS score                         equals 9. Findings:      The perianal and digital rectal examinations were normal. Pertinent       negatives include normal sphincter tone and no palpable rectal lesions.      A 9 mm polyp was found in the transverse colon. The polyp was sessile.       The polyp was removed with a hot snare. Resection and retrieval were       complete. Estimated blood loss: none.      A 15 mm polyp was found in the transverse colon. The polyp was sessile.       Preparations were made for mucosal resection. Eleview was injected to       raise the lesion. Snare mucosal resection was performed. Resection and       retrieval were complete. To prevent bleeding after mucosal resection,       three hemostatic clips were successfully placed. There was no bleeding       during, or at the end, of the procedure. Estimated blood loss: none.      A few diverticula were found in the sigmoid colon.      The retroflexed view of the distal rectum and anal verge was normal and       showed no anal or rectal abnormalities. Impression:            - One 9 mm polyp in the transverse colon, removed with                         a hot snare. Resected and retrieved.                        -  One 15 mm polyp in the transverse colon, removed                         with mucosal resection. Resected and retrieved. Clips                         were placed.                        - Diverticulosis in the sigmoid colon.                        - The distal rectum and anal verge are normal on                         retroflexion view.                        - Mucosal resection was performed. Resection and                         retrieval were complete. Recommendation:        - Discharge patient to home (with escort).                        - Resume previous diet today.                         - Continue present medications.                        - Await pathology results.                        - Repeat colonoscopy in 3 years for surveillance based                         on pathology results. Procedure Code(s):     --- Professional ---                        [phone], Colonoscopy, flexible; with endoscopic mucosal                         resection                        [id number], 19, Colonoscopy, flexible; with removal of                         tumor(s), polyp(s), or other lesion(s) by snare                         technique Diagnosis Code(s):     --- Professional ---                        K63.5, Polyp of colon                        R19.5, Other fecal abnormalities  K57.30, Diverticulosis of large intestine without                         perforation or abscess without bleeding CPT copyright 2019 American Medical Association. All rights reserved. The codes documented in this report are preliminary and upon coder review may  be revised to meet current compliance requirements. Dr. Ulyess Mort Lin Landsman MD, MD 12/14/2021 11:18:01 AM This report has been signed electronically. Number of Addenda: 0 Note Initiated On: 12/14/2021 10:24 AM Scope Withdrawal Time: 0 hours 33 minutes 15 seconds  Total Procedure Duration: 0 hours 38 minutes 39 seconds  Estimated Blood Loss:  Estimated blood loss: none.      [facility]

## 2021-12-14 NOTE — Anesthesia Postprocedure Evaluation (Signed)
Anesthesia Post Note  Patient: Kristen Caldwell  Procedure(s) Performed: COLONOSCOPY WITH PROPOFOL  Patient location during evaluation: Endoscopy Anesthesia Type: General Level of consciousness: awake and alert Pain management: pain level controlled Vital Signs Assessment: post-procedure vital signs reviewed and stable Respiratory status: spontaneous breathing, nonlabored ventilation, respiratory function stable and patient connected to nasal cannula oxygen Cardiovascular status: blood pressure returned to baseline and stable Postop Assessment: no apparent nausea or vomiting Anesthetic complications: no   No notable events documented.   Last Vitals:  Vitals:   12/14/21 0951 12/14/21 1120  BP: (!) 131/96   Pulse: (!) 115 85  Resp: 18   Temp: (!) 36.1 C (!) 36.1 C  SpO2: 100%     Last Pain:  Vitals:   12/14/21 1140  TempSrc:   PainSc: 0-No pain                 Precious Haws Dajion Bickford

## 2021-12-14 NOTE — Transfer of Care (Signed)
Immediate Anesthesia Transfer of Care Note  Patient: Kristen Caldwell  Procedure(s) Performed: COLONOSCOPY WITH PROPOFOL  Patient Location: Endoscopy Unit  Anesthesia Type:General  Level of Consciousness: drowsy  Airway & Oxygen Therapy: Patient Spontanous Breathing  Post-op Assessment: Report given to RN and Post -op Vital signs reviewed and stable  Post vital signs: Reviewed and stable  Last Vitals:  Vitals Value Taken Time  BP 104/56 12/14/21 1120  Temp    Pulse 123 12/14/21 1119  Resp 14 12/14/21 1120  SpO2 92 % 12/14/21 1119  Vitals shown include unvalidated device data.  Last Pain:  Vitals:   12/14/21 0951  PainSc: 0-No pain         Complications: No notable events documented.

## 2021-12-15 ENCOUNTER — Encounter: Payer: Self-pay | Admitting: Gastroenterology

## 2021-12-15 LAB — SURGICAL PATHOLOGY

## 2021-12-16 NOTE — Progress Notes (Signed)
Patient called with concerns for continuous nausea without vomiting since her Colonoscopy with Dr Marius Ditch on Tuesday, December 14, 2021.  The patient is tolerating liquids and a lite diet, but the nausea continues.  Abdomen is soft, slight bloating, no pain.  I spoke with Dr Marius Ditch regarding her symptoms, and she advises that the patient take her prescribed zofran and to add miralax and follow the instructions on the bottle.  The patient was given these instructions and will notify Dr Marius Ditch if her symptoms persist or worsen.  Ms. Susman verbalizes understanding and agrees with the plan of care.

## 2021-12-17 ENCOUNTER — Telehealth: Payer: Self-pay

## 2021-12-17 NOTE — Telephone Encounter (Signed)
Patient states she is taking the Metamucil daily. She states she has not had a bowel movement since her colonoscopy.She has not vomited but is really nausea. She states that the only thing she can eat is crackers

## 2021-12-17 NOTE — Telephone Encounter (Signed)
Patient verbalized understanding of instructions  

## 2021-12-17 NOTE — Telephone Encounter (Signed)
Patient had colonoscopy on 12/14/2021. She states 30 minutes after she took the second prep she became nausea. She state she has been nausea ever since. She states she has taken the Zofran but it only works for like a hour and then she is nausea again. Patient wants to know what else she can do

## 2021-12-17 NOTE — Telephone Encounter (Signed)
Recommend to start taking miralax along with plenty of water and hold metamucil until she has a BM. She is feeling nauseous because she isn't moving her bowels yet  RV

## 2021-12-31 ENCOUNTER — Other Ambulatory Visit: Payer: Self-pay

## 2021-12-31 DIAGNOSIS — K219 Gastro-esophageal reflux disease without esophagitis: Secondary | ICD-10-CM

## 2021-12-31 MED ORDER — OMEPRAZOLE 20 MG PO CPDR: 20.0000 mg | DELAYED_RELEASE_CAPSULE | Freq: Every day | ORAL | 0 refills | Status: DC

## 2022-01-03 DIAGNOSIS — H2513 Age-related nuclear cataract, bilateral: Secondary | ICD-10-CM | POA: Diagnosis not present

## 2022-01-12 ENCOUNTER — Telehealth: Payer: Self-pay | Admitting: Family Medicine

## 2022-01-12 DIAGNOSIS — F411 Generalized anxiety disorder: Secondary | ICD-10-CM

## 2022-01-12 MED ORDER — AMITRIPTYLINE HCL 25 MG PO TABS: 25.0000 mg | ORAL_TABLET | Freq: Every evening | ORAL | 0 refills | Status: DC | PRN

## 2022-01-12 NOTE — Telephone Encounter (Signed)
#  90 day Refill sent

## 2022-01-12 NOTE — Telephone Encounter (Signed)
  Encourage patient to contact the pharmacy for refills or they can request refills through [facility]  Did the patient contact the pharmacy: Yes  LAST APPOINTMENT DATE: 11/16/2021  NEXT APPOINTMENT DATE: 02/14/2022  MEDICATION: amitriptyline (ELAVIL) 25 MG tablet  Is the patient out of medication? No  If not, how much is left? Few left for the week  PHARMACY: [place], [place] - [address]  Let patient know to contact pharmacy at the end of the day to make sure medication is ready.  Please notify patient to allow 48-72 hours to process

## 2022-01-31 ENCOUNTER — Telehealth: Payer: Self-pay | Admitting: Family Medicine

## 2022-01-31 DIAGNOSIS — F410 Panic disorder [episodic paroxysmal anxiety] without agoraphobia: Secondary | ICD-10-CM

## 2022-01-31 NOTE — Telephone Encounter (Signed)
  Encourage patient to contact the pharmacy for refills or they can request refills through [facility]:  Please schedule appointment if longer than 1 year  NEXT APPOINTMENT DATE:  MEDICATION:  clorazepate (TRANXENE) 3.75 MG tablet  Is the patient out of medication?   PHARMACY: [place], [place] - [id number]    Let patient know to contact pharmacy at the end of the day to make sure medication is ready.  Please notify patient to allow 48-72 hours to process  CLINICAL FILLS OUT ALL BELOW:   LAST REFILL:  QTY:  REFILL DATE:    OTHER COMMENTS:    Okay for refill?  Please advise

## 2022-02-01 NOTE — Telephone Encounter (Signed)
Patient has Toc appointment 02/14/22. Ok to refill?

## 2022-02-02 MED ORDER — CLORAZEPATE DIPOTASSIUM 3.75 MG PO TABS: 3.7500 mg | ORAL_TABLET | Freq: Every day | ORAL | 0 refills | Status: DC

## 2022-02-02 NOTE — Telephone Encounter (Signed)
Will not let me send refill can you send please

## 2022-02-05 ENCOUNTER — Other Ambulatory Visit: Payer: Self-pay | Admitting: Family

## 2022-02-05 DIAGNOSIS — F410 Panic disorder [episodic paroxysmal anxiety] without agoraphobia: Secondary | ICD-10-CM

## 2022-02-05 MED ORDER — CLORAZEPATE DIPOTASSIUM 3.75 MG PO TABS: 3.7500 mg | ORAL_TABLET | Freq: Every day | ORAL | 0 refills | Status: DC

## 2022-02-14 ENCOUNTER — Encounter: Payer: Medicare Other | Admitting: Family

## 2022-02-14 ENCOUNTER — Encounter: Payer: Self-pay | Admitting: Family

## 2022-02-14 ENCOUNTER — Ambulatory Visit (INDEPENDENT_AMBULATORY_CARE_PROVIDER_SITE_OTHER): Payer: Medicare Other | Admitting: Family

## 2022-02-14 VITALS — BP 130/80 | HR 80 | Temp 97.9°F | Resp 16 | Ht 61.0 in | Wt 100.1 lb

## 2022-02-14 DIAGNOSIS — E871 Hypo-osmolality and hyponatremia: Secondary | ICD-10-CM

## 2022-02-14 DIAGNOSIS — H9313 Tinnitus, bilateral: Secondary | ICD-10-CM | POA: Diagnosis not present

## 2022-02-14 DIAGNOSIS — R519 Headache, unspecified: Secondary | ICD-10-CM | POA: Diagnosis not present

## 2022-02-14 DIAGNOSIS — E785 Hyperlipidemia, unspecified: Secondary | ICD-10-CM | POA: Diagnosis not present

## 2022-02-14 DIAGNOSIS — K921 Melena: Secondary | ICD-10-CM | POA: Diagnosis not present

## 2022-02-14 DIAGNOSIS — Z86010 Personal history of colonic polyps: Secondary | ICD-10-CM | POA: Diagnosis not present

## 2022-02-14 DIAGNOSIS — Z79890 Hormone replacement therapy (postmenopausal): Secondary | ICD-10-CM

## 2022-02-14 DIAGNOSIS — G8929 Other chronic pain: Secondary | ICD-10-CM

## 2022-02-14 LAB — BASIC METABOLIC PANEL
BUN: 15 mg/dL (ref 6–23)
CO2: 28 mEq/L (ref 19–32)
Calcium: 9.5 mg/dL (ref 8.4–10.5)
Chloride: 104 mEq/L (ref 96–112)
Creatinine, Ser: 0.82 mg/dL (ref 0.40–1.20)
GFR: 69.74 mL/min (ref >60.00)
Glucose, Bld: 97 mg/dL (ref 70–99)
Potassium: 4.4 mEq/L (ref 3.5–5.1)
Sodium: 138 mEq/L (ref 135–145)

## 2022-02-14 LAB — CBC
HCT: 43.4 % (ref 36.0–46.0)
Hemoglobin: 14.5 g/dL (ref 12.0–15.0)
MCHC: 33.4 g/dL (ref 30.0–36.0)
MCV: 91.7 fl (ref 78.0–100.0)
Platelets: 212 10*3/uL (ref 150.0–400.0)
RBC: 4.74 Mil/uL (ref 3.87–5.11)
RDW: 12.7 % (ref 11.5–15.5)
WBC: 8.2 10*3/uL (ref 4.0–10.5)

## 2022-02-14 LAB — LIPID PANEL
Cholesterol: 210 mg/dL — ABNORMAL HIGH (ref 0–200)
HDL: 72.5 mg/dL (ref >39.00)
LDL Cholesterol: 118 mg/dL — ABNORMAL HIGH (ref 0–99)
NonHDL: 137.12
Total CHOL/HDL Ratio: 3
Triglycerides: 97 mg/dL (ref 0.0–149.0)
VLDL: 19.4 mg/dL (ref 0.0–40.0)

## 2022-02-14 NOTE — Assessment & Plan Note (Signed)
D/w pt possible need for statin , pt declines for now awaiting results of lipid panel. Gave pt handout with low chol diet recommendations Ordered lipid panel, pending results. Work on low cholesterol diet and exercise as tolerated

## 2022-02-14 NOTE — Assessment & Plan Note (Signed)
Stable not worse not better.  Chronic.

## 2022-02-14 NOTE — Assessment & Plan Note (Signed)
D/w pt need to d/c from this and cut down quicker than has been, discussed taking three times a week for about two weeks then completely d/c. D/w pt risk of ongoing hormonal therapy

## 2022-02-14 NOTE — Assessment & Plan Note (Signed)
Will repeat cmp today pending results.

## 2022-02-14 NOTE — Assessment & Plan Note (Signed)
Stabilized with current medication therapy

## 2022-02-14 NOTE — Progress Notes (Signed)
Established Patient Office Visit  Subjective:  Patient ID: Kristen Caldwell, female    DOB: [birth year]  Age: 76 y.o. MRN: [id number]  CC:  Chief Complaint  Patient presents with   Transitions Of Care    HPI Kristen Caldwell is here for a transition of care visit.  Prior provider was: Dr. Waunita Schooner  Pt is without acute concerns.   Positive ifob: did have colonscopy 9/23. Two polyps. Repeat colonoscopy due in three years.    Anxiety: doing well, husband was alcoholic and now with dementia which can cause her some stress. He is now in long term care since 2020. Currently at her home and son lives with her some.   chronic concerns:  Hyperlipidemia: recently started fish oils over the counter. She states she tries to eat pretty well, does use canola oil in her vegetables. Doesn't like cheese. Doesn't drink dairy often. She doesn't eat shrimp, doesn't like red meat. Does have a strong family history of CAD.   Recurrent UTI: started on daily cranberry supplement.   Hormones, on estradiol, in the past was having hot flashes. She has been decreased to 1/2 tablet 25 mg daily. She's now has decreased to only take five days a week. Every once in a while will wake up at night and be hot.   Chronic headache: elavil 25 mg once daily. Takes topamax 25 mg twice daily. Frequency is very few, very little does she get a headache. Controlled and stable. Did have botox in the past , saw neurologist in the past no longer neurology.   Past Medical History:  Diagnosis Date   Anxiety    Chronic headaches    COVID 03/03/2020   Depression    Diverticulosis    GERD (gastroesophageal reflux disease)    Osteoporosis     Past Surgical History:  Procedure Laterality Date   ABDOMINAL HYSTERECTOMY  03/14/1989   partial   COLONOSCOPY     COLONOSCOPY WITH PROPOFOL N/A 12/14/2021   Procedure: COLONOSCOPY WITH PROPOFOL;  Surgeon: Lin Landsman, MD;  Location: [facility] ENDOSCOPY;   Service: Gastroenterology;  Laterality: N/A;   TUBAL LIGATION      Family History  Problem Relation Age of Onset   Aneurysm Mother    Stroke Father    Hypertension Father    Lupus Sister    Osteoporosis Sister    Hypertension Brother    Heart attack Brother    Breast cancer Paternal Aunt    Diabetes Paternal Grandfather    Colon cancer Neg Hx     Social History   Socioeconomic History   Marital status: Married    Spouse name: Gwyndolyn Saxon   Number of children: 2   Years of education: high school   Highest education level: Not on file  Occupational History   Not on file  Tobacco Use   Smoking status: Never   Smokeless tobacco: Never  Vaping Use   Vaping Use: Never used  Substance and Sexual Activity   Alcohol use: No   Drug use: No   Sexual activity: Not Currently    Partners: Male  Other Topics Concern   Not on file  Social History Narrative   04/20/20   From: the area   Living: with son - Kristen Caldwell and husband is in long term care   Work: part-time 2 days a week, making window treatments      Family: Kristen Caldwell and Kristen Caldwell - 2 grandchildren, and 1 adopted grandchildren  Enjoys: work is fun it is with friends      Exercise: walking, bending at work   Diet: tries to eat healthy      Safety   Seat belts: Yes    Guns: Yes  and secure   Safe in relationships: Yes             Social Determinants of Health   Financial Resource Strain: Low Risk  (11/16/2021)   Overall Financial Resource Strain (CARDIA)    Difficulty of Paying Living Expenses: Not hard at all  Food Insecurity: No Food Insecurity (11/16/2021)   Hunger Vital Sign    Worried About Running Out of Food in the Last Year: Never true    [place] in the Last Year: Never true  Transportation Needs: No Transportation Needs (11/16/2021)   PRAPARE - Hydrologist (Medical): No    Lack of Transportation (Non-Medical): No  Physical Activity: Inactive (11/16/2021)   Exercise Vital  Sign    Days of Exercise per Week: 0 days    Minutes of Exercise per Session: 0 min  Stress: No Stress Concern Present (11/16/2021)   [place]    Feeling of Stress : Not at all  Social Connections: [place] (11/16/2021)   Social Connection and Isolation Panel [NHANES]    Frequency of Communication with Friends and Family: More than three times a week    Frequency of Social Gatherings with Friends and Family: More than three times a week    Attends Religious Services: More than 4 times per year    Active Member of [employer] or Organizations: Yes    Attends Music therapist: More than 4 times per year    Marital Status: Married  Human resources officer Violence: Not At Risk (11/16/2021)   Humiliation, Afraid, Rape, and Kick questionnaire    Fear of Current or Ex-Partner: No    Emotionally Abused: No    Physically Abused: No    Sexually Abused: No    Outpatient Medications Prior to Visit  Medication Sig Dispense Refill   amitriptyline (ELAVIL) 25 MG tablet Take 1 tablet (25 mg total) by mouth at bedtime as needed. for sleep 90 tablet 0   Calcium Carbonate-Vit D-Min (CALTRATE 600+D PLUS) 600-400 MG-UNIT per tablet Take 2 tablets by mouth daily.     clorazepate (TRANXENE) 3.75 MG tablet Take 1 tablet (3.75 mg total) by mouth daily. 90 tablet 0   Cranberry (AZO CRANBERRY GUMMIES) 250 MG CHEW Chew 250 mg by mouth daily.     estradiol (ESTRACE) 0.5 MG tablet Take 1/2 (0.'25mg'$ ) of a pill daily. 90 tablet 1   meclizine (ANTIVERT) 12.5 MG tablet Take 1 tablet (12.5 mg total) by mouth 3 (three) times daily as needed for dizziness. 30 tablet 0   Multiple Vitamins-Minerals (CENTRUM SILVER) CHEW Chew 1 tablet by mouth daily.     Omega-3 Fatty Acids (FISH OIL PO) Take 1 capsule by mouth 2 (two) times daily.     omeprazole (PRILOSEC) 20 MG capsule Take 1 capsule (20 mg total) by mouth daily. 90 capsule 0   ondansetron (ZOFRAN)  8 MG tablet Take 1 tablet (8 mg total) by mouth every 8 (eight) hours as needed for nausea or vomiting. 30 tablet 2   topiramate (TOPAMAX) 25 MG tablet TAKE 1 TABLET(25 MG) BY MOUTH TWICE DAILY 180 tablet 2   TURMERIC PO Take by mouth.  vitamin B-12 (CYANOCOBALAMIN) 1000 MCG tablet Take 1,000 mcg by mouth daily.     No facility-administered medications prior to visit.    Allergies  Allergen Reactions   Cefuroxime Nausea Only   Amoxicillin Nausea Only    REACTION: NAUSEA   Cephalexin     REACTION: NAUSEA AND VOMITING   Promethazine Hcl Rash   Sertraline Diarrhea   Sulfa Antibiotics Nausea Only       Objective:    Physical Exam Constitutional:      General: She is not in acute distress.    Appearance: Normal appearance. She is normal weight. She is not ill-appearing, toxic-appearing or diaphoretic.  Cardiovascular:     Rate and Rhythm: Normal rate and regular rhythm.  Pulmonary:     Effort: Pulmonary effort is normal.     Breath sounds: Normal breath sounds.  Neurological:     General: No focal deficit present.     Mental Status: She is alert. Mental status is at baseline.  Psychiatric:        Mood and Affect: Mood normal.        Behavior: Behavior normal.        Thought Content: Thought content normal.        Judgment: Judgment normal.      BP 130/80   Pulse 80   Temp 97.9 F (36.6 C)   Resp 16   Ht '5\' 1"'$  (1.549 m)   Wt 100 lb 2 oz (45.4 kg)   SpO2 98%   BMI 18.92 kg/m  Wt Readings from Last 3 Encounters:  02/14/22 100 lb 2 oz (45.4 kg)  12/14/21 101 lb (45.8 kg)  11/16/21 100 lb 12.8 oz (45.7 kg)     Health Maintenance Due  Topic Date Due   COVID-19 Vaccine (4 - 2023-24 season) 11/12/2021   MAMMOGRAM  12/07/2021    There are no preventive care reminders to display for this patient.  Lab Results  Component Value Date   TSH 2.89 07/27/2020   Lab Results  Component Value Date   WBC 8.2 02/14/2022   HGB 14.5 02/14/2022   HCT 43.4 02/14/2022    MCV 91.7 02/14/2022   PLT 212.0 02/14/2022   Lab Results  Component Value Date   NA 138 02/14/2022   K 4.4 02/14/2022   CO2 28 02/14/2022   GLUCOSE 97 02/14/2022   BUN 15 02/14/2022   CREATININE 0.82 02/14/2022   BILITOT 0.4 07/19/2021   ALKPHOS 63 07/19/2021   AST 19 07/19/2021   ALT 13 07/19/2021   PROT 7.6 07/19/2021   ALBUMIN 4.3 07/19/2021   CALCIUM 9.5 02/14/2022   ANIONGAP 11 05/12/2020   GFR 69.74 02/14/2022   Lab Results  Component Value Date   CHOL 210 (H) 02/14/2022   Lab Results  Component Value Date   HDL 72.50 02/14/2022   Lab Results  Component Value Date   LDLCALC 118 (H) 02/14/2022   Lab Results  Component Value Date   TRIG 97.0 02/14/2022   Lab Results  Component Value Date   CHOLHDL 3 02/14/2022   Lab Results  Component Value Date   HGBA1C 5.4 07/19/2021      Assessment & Plan:   Problem List Items Addressed This Visit       Other   HLD (hyperlipidemia)    D/w pt possible need for statin , pt declines for now awaiting results of lipid panel. Gave pt handout with low chol diet recommendations Ordered lipid panel, pending results.  Work on low cholesterol diet and exercise as tolerated       Relevant Orders   Lipid panel (Completed)   Postmenopausal HRT (hormone replacement therapy)    D/w pt need to d/c from this and cut down quicker than has been, discussed taking three times a week for about two weeks then completely d/c. D/w pt risk of ongoing hormonal therapy      Chronic nonintractable headache    Stabilized with current medication therapy       Tinnitus of both ears - Primary    Stable not worse not better.  Chronic.      History of colon polyps   Low sodium levels    Will repeat cmp today pending results.      Relevant Orders   Basic metabolic panel (Completed)   RESOLVED: Blood in stool   Relevant Orders   CBC (Completed)    No orders of the defined types were placed in this encounter.   Follow-up:  Return in about 6 months (around 08/16/2022) for f/u cholesterol.    Eugenia Pancoast, FNP

## 2022-02-14 NOTE — Patient Instructions (Addendum)
------------------------------------   Stop by the lab prior to leaving today. I will notify you of your results once received.    ------------------------------------  Welcome to our clinic, I am happy to have you as my new patient. I am excited to continue on this healthcare journey with you.  Stop by the lab prior to leaving today. I will notify you of your results once received.   Please keep in mind Any my chart messages you send have up to a three business day turnaround for a response.  Phone calls may take up to a one full business day turnaround for a  response.   If you need a medication refill I recommend you request it through the pharmacy as this is easiest for [place] rather than sending a message and or phone call.   Due to recent changes in healthcare laws, you may see results of your imaging and/or laboratory studies on MyChart before I have had a chance to review them.  I understand that in some cases there may be results that are confusing or concerning to you. Please understand that not all results are received at the same time and often I may need to interpret multiple results in order to provide you with the best plan of care or course of treatment. Therefore, I ask that you please give me 2 business days to thoroughly review all your results before contacting my office for clarification. Should we see a critical lab result, you will be contacted sooner.   It was a pleasure seeing you today! Please do not hesitate to reach out with any questions and or concerns.  Regards,   Eugenia Pancoast FNP-C

## 2022-03-06 ENCOUNTER — Ambulatory Visit
Admission: EM | Admit: 2022-03-06 | Discharge: 2022-03-06 | Disposition: A | Discharge status: Home / Self Care | Payer: Medicare Other | Attending: Urgent Care | Admitting: Urgent Care

## 2022-03-06 DIAGNOSIS — R350 Frequency of micturition: Secondary | ICD-10-CM | POA: Insufficient documentation

## 2022-03-06 DIAGNOSIS — B3731 Acute candidiasis of vulva and vagina: Secondary | ICD-10-CM | POA: Diagnosis not present

## 2022-03-06 DIAGNOSIS — R300 Dysuria: Secondary | ICD-10-CM | POA: Insufficient documentation

## 2022-03-06 DIAGNOSIS — R3915 Urgency of urination: Secondary | ICD-10-CM | POA: Diagnosis not present

## 2022-03-06 LAB — POCT URINALYSIS DIP (MANUAL ENTRY)
Bilirubin, UA: NEGATIVE
Blood, UA: NEGATIVE
Glucose, UA: NEGATIVE mg/dL
Ketones, POC UA: NEGATIVE mg/dL
Nitrite, UA: NEGATIVE
Protein Ur, POC: NEGATIVE mg/dL
Spec Grav, UA: 1.015 (ref 1.010–1.025)
Urobilinogen, UA: 0.2 E.U./dL
pH, UA: 7 (ref 5.0–8.0)

## 2022-03-06 MED ORDER — FLUCONAZOLE 150 MG PO TABS: 150.0000 mg | ORAL_TABLET | Freq: Once | ORAL | 0 refills | Status: AC

## 2022-03-06 MED ORDER — DOXYCYCLINE HYCLATE 100 MG PO CAPS: 100.0000 mg | ORAL_CAPSULE | Freq: Two times a day (BID) | ORAL | 0 refills | Status: AC

## 2022-03-06 NOTE — Discharge Instructions (Signed)
Please follow-up with your primary care or urology provider as needed.

## 2022-03-06 NOTE — ED Triage Notes (Signed)
Pt. Presents to UC w/ c/o dizziness, nausea and urinary urgency for the past 6 days.

## 2022-03-06 NOTE — ED Provider Notes (Addendum)
Kristen Caldwell    CSN: [id number] Arrival date & time: 03/06/22  1062      History   Chief Complaint No chief complaint on file.   HPI Kristen Caldwell is a 76 y.o. female.   HPI  Patient presents to urgent care with complaint of dizziness, nausea, urinary urgency x 6 days.  She states that these are the symptoms that she generally gets (dizziness and nausea) with UTIs.  She states she has been treated by a specialist who has "never been able to find anything".  She endorses recent urinary frequency and burning with urination.  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   Postural dizziness with near syncope 05/20/2020   CKD (chronic kidney disease) stage 3, GFR 30-59 ml/min (HCC) 01/07/2020   Chronic bilateral low back pain without sciatica 01/07/2020   Dupuytren's contracture of right hand 11/11/2019   Postmenopausal HRT (hormone replacement therapy) 05/23/2013   HLD (hyperlipidemia) 06/09/2008   Generalized anxiety disorder with panic attacks 02/21/2007   Depression 02/21/2007   Migraines 02/21/2007   GERD 02/21/2007   Osteoporosis 02/21/2007   CARPAL TUNNEL SYNDROME, HX OF 02/21/2007    Past Surgical History:  Procedure Laterality Date   ABDOMINAL HYSTERECTOMY  03/14/1989   partial   COLONOSCOPY     COLONOSCOPY WITH PROPOFOL N/A 12/14/2021   Procedure: COLONOSCOPY WITH PROPOFOL;  Surgeon: Lin Landsman, MD;  Location: [place];  Service: Gastroenterology;  Laterality: N/A;   TUBAL LIGATION      OB History    No obstetric history on file.      Home Medications    Prior to Admission medications   Medication Sig Start Date End Date Taking? Authorizing Provider  amitriptyline (ELAVIL) 25 MG tablet Take 1 tablet (25 mg total) by mouth at bedtime as needed. for sleep 01/12/22   Dutch Quint B, FNP  Calcium Carbonate-Vit D-Min (CALTRATE 600+D PLUS) 600-400 MG-UNIT per tablet Take 2 tablets by mouth daily.    [provider]  clorazepate (TRANXENE) 3.75 MG tablet Take 1 tablet (3.75 mg total) by mouth daily. 02/05/22   Kennyth Arnold, FNP  Cranberry (AZO CRANBERRY GUMMIES) 250 MG CHEW Chew 250 mg by mouth daily.    [provider]  estradiol (ESTRACE) 0.5 MG tablet Take 1/2 (0.'25mg'$ ) of a pill daily. 06/29/21   Waunita Schooner, MD  meclizine (ANTIVERT) 12.5 MG tablet Take 1 tablet (12.5 mg total) by mouth 3 (three) times daily as needed for dizziness. 06/16/21   Waunita Schooner, MD  Multiple Vitamins-Minerals (CENTRUM SILVER) CHEW Chew 1 tablet by mouth daily.    [provider]  Omega-3 Fatty Acids (FISH OIL PO) Take 1 capsule by mouth 2 (two) times daily.    [provider]  omeprazole (PRILOSEC) 20 MG capsule Take 1 capsule (20 mg total) by mouth daily. 12/31/21   Kennyth Arnold, FNP  ondansetron (ZOFRAN) 8 MG tablet Take 1 tablet (8 mg total) by mouth every 8 (eight) hours as needed  for nausea or vomiting. 06/14/21   Waunita Schooner, MD  topiramate (TOPAMAX) 25 MG tablet TAKE 1 TABLET(25 MG) BY MOUTH TWICE DAILY 07/19/21   Waunita Schooner, MD  TURMERIC PO Take by mouth.    [provider]  vitamin B-12 (CYANOCOBALAMIN) 1000 MCG tablet Take 1,000 mcg by mouth daily.    [provider]    Family History Family History  Problem Relation Age of Onset   Aneurysm Mother    Stroke Father    Hypertension Father    Lupus Sister    Osteoporosis Sister    Hypertension Brother    Heart attack Brother    Breast cancer Paternal Aunt    Diabetes Paternal  Grandfather    Colon cancer Neg Hx     Social History Social History   Tobacco Use   Smoking status: Never   Smokeless tobacco: Never  Vaping Use   Vaping Use: Never used  Substance Use Topics   Alcohol use: No   Drug use: No     Allergies   Cefuroxime, Amoxicillin, Cephalexin, Promethazine hcl, Sertraline, and Sulfa antibiotics   Review of Systems Review of Systems   Physical Exam Triage Vital Signs ED Triage Vitals  Enc Vitals Group     BP      Pulse      Resp      Temp      Temp src      SpO2      Weight      Height      Head Circumference      Peak Flow      Pain Score      Pain Loc      Pain Edu?      Excl. in [place]?    No data found.  Updated Vital Signs There were no vitals taken for this visit.  Visual Acuity Right Eye Distance:   Left Eye Distance:   Bilateral Distance:    Right Eye Near:   Left Eye Near:    Bilateral Near:     Physical Exam Vitals reviewed.  Constitutional:      Appearance: Normal appearance.  Skin:    General: Skin is warm and dry.  Neurological:     General: No focal deficit present.     Mental Status: She is alert and oriented to person, place, and time.  Psychiatric:        Mood and Affect: Mood normal.        Behavior: Behavior normal.      UC Treatments / Results  Labs (all labs ordered are listed, but only abnormal results are displayed) Labs Reviewed  POCT URINALYSIS DIP (MANUAL ENTRY) - Abnormal; Notable for the following components:      Result Value   Leukocytes, UA Trace (*)    All other components within normal limits    EKG   Radiology No results found.  Procedures Procedures (including critical care time)  Medications Ordered in UC Medications - No data to display  Initial Impression / Assessment and Plan / UC Course  I have reviewed the triage vital signs and the nursing notes.  Pertinent labs & imaging results that were available during my care of the patient were reviewed by  me and considered in my medical decision making (see chart for details).   UA does not indicate clear signs and symptoms of UTI.  There are trace leukocytes.  Will prescribe an antibiotic based on her history and  send for confirmatory culture.  Patient's chart indicates successful treatment of doxycycline in the past. she endorses intolerance to penicillins and cephalosporins.  Past cultures show infection with group B strep which may constitute a chronic infection however patient has been treated with Doxy because she has reported improved symptoms with treatment.  She also requests prescription for antifungal for inevitable yeast infection to follow antibiotic use.   Final Clinical Impressions(s) / UC Diagnoses   Final diagnoses:  None   Discharge Instructions   None    ED Prescriptions   None    PDMP not reviewed this encounter.   Rose Phi, FNP 03/06/22 1400    ImmordinoAnnie Main,  03/06/22 [phone]

## 2022-03-07 LAB — URINE CULTURE: Culture: NO GROWTH

## 2022-03-27 ENCOUNTER — Other Ambulatory Visit: Payer: Self-pay | Admitting: Family

## 2022-03-27 DIAGNOSIS — K219 Gastro-esophageal reflux disease without esophagitis: Secondary | ICD-10-CM

## 2022-03-31 ENCOUNTER — Other Ambulatory Visit: Payer: Self-pay | Admitting: Family

## 2022-03-31 DIAGNOSIS — K219 Gastro-esophageal reflux disease without esophagitis: Secondary | ICD-10-CM

## 2022-03-31 NOTE — Addendum Note (Signed)
Addended by: Vaughan Browner on: 03/31/2022 04:05 PM   Modules accepted: Orders

## 2022-03-31 NOTE — Telephone Encounter (Signed)
Patient has had a TOC appt with you, but I didn't see where you addressed this medication in the office visit.

## 2022-03-31 NOTE — Telephone Encounter (Signed)
Prescription Request  03/31/2022  Is this a "Controlled Substance" medicine? No  LOV: 02/14/2022  What is the name of the medication or equipment? omeprazole (PRILOSEC) 20 MG capsule   Have you contacted your pharmacy to request a refill? No   Which pharmacy would you like this sent to?  [place], [place] - [address] [place] [address] Phone: [phone] Fax: [phone]    Patient notified that their request is being sent to the clinical staff for review and that they should receive a response within 2 business days.   Please advise at Mobile [phone] (mobile)

## 2022-04-01 MED ORDER — OMEPRAZOLE 20 MG PO CPDR: 20.0000 mg | DELAYED_RELEASE_CAPSULE | Freq: Every day | ORAL | 1 refills | Status: DC

## 2022-04-07 ENCOUNTER — Other Ambulatory Visit: Payer: Self-pay | Admitting: Family

## 2022-04-07 DIAGNOSIS — F411 Generalized anxiety disorder: Secondary | ICD-10-CM

## 2022-04-18 ENCOUNTER — Other Ambulatory Visit: Payer: Self-pay | Admitting: Family

## 2022-04-18 DIAGNOSIS — G8929 Other chronic pain: Secondary | ICD-10-CM

## 2022-04-18 DIAGNOSIS — F411 Generalized anxiety disorder: Secondary | ICD-10-CM

## 2022-04-18 DIAGNOSIS — F410 Panic disorder [episodic paroxysmal anxiety] without agoraphobia: Secondary | ICD-10-CM

## 2022-04-18 NOTE — Telephone Encounter (Signed)
Prescription Request  04/18/2022  Is this a "Controlled Substance" medicine? No  LOV: 02/14/2022  What is the name of the medication or equipment?  topiramate (TOPAMAX) 25 MG tablet  amitriptyline (ELAVIL) 25 MG tablet   Have you contacted your pharmacy to request a refill? Yes   Which pharmacy would you like this sent to?  [place], [place] - [address] [place] [address] Phone: [phone] Fax: [phone]    Patient notified that their request is being sent to the clinical staff for review and that they should receive a response within 2 business days.   Please advise at Mobile [phone] (mobile)

## 2022-04-19 MED ORDER — TOPIRAMATE 25 MG PO TABS: ORAL_TABLET | ORAL | 2 refills | Status: DC

## 2022-04-19 MED ORDER — AMITRIPTYLINE HCL 25 MG PO TABS: 25.0000 mg | ORAL_TABLET | Freq: Every evening | ORAL | 0 refills | Status: DC | PRN

## 2022-04-19 NOTE — Addendum Note (Signed)
Addended by: Eugenia Pancoast on: 04/19/2022 03:04 PM   Modules accepted: Orders

## 2022-04-19 NOTE — Telephone Encounter (Signed)
Refilled

## 2022-05-03 ENCOUNTER — Other Ambulatory Visit: Payer: Self-pay | Admitting: Family

## 2022-05-03 DIAGNOSIS — F410 Panic disorder [episodic paroxysmal anxiety] without agoraphobia: Secondary | ICD-10-CM

## 2022-05-09 MED ORDER — CLORAZEPATE DIPOTASSIUM 3.75 MG PO TABS: 3.7500 mg | ORAL_TABLET | Freq: Every day | ORAL | 0 refills | Status: DC

## 2022-05-09 NOTE — Telephone Encounter (Signed)
Prescription Request  05/09/2022  Is this a "Controlled Substance" medicine? No  LOV: 02/14/2022  What is the name of the medication or equipment? clorazepate (TRANXENE) 3.75 MG tablet [id number]   Have you contacted your pharmacy to request a refill? Pt states she was being told by [facility] they have sent multiple refill request in & received no response.  Which pharmacy would you like this sent to?  [place], [place] - [address] [place] [address] Phone: [phone] Fax: [phone]    Patient notified that their request is being sent to the clinical staff for review and that they should receive a response within 2 business days.   Please advise at Mobile [phone] (mobile)  Pt states she only has 2 pills remaining

## 2022-05-09 NOTE — Telephone Encounter (Signed)
Refill request for clorazepate (TRANXENE) 3.75 MG tablet  LV- 02/14/22; LR- 02/05/22 (90tabs/no refills); NV- 11/21/22. Ok to refill med?

## 2022-05-09 NOTE — Addendum Note (Signed)
Addended by: Vaughan Browner on: 05/09/2022 09:40 AM   Modules accepted: Orders

## 2022-05-23 ENCOUNTER — Telehealth: Payer: Self-pay | Admitting: Family

## 2022-05-23 NOTE — Telephone Encounter (Signed)
Please see PA denial and alternatives that can be used.

## 2022-05-23 NOTE — Telephone Encounter (Signed)
Please initiate a PA. Thanks

## 2022-05-23 NOTE — Telephone Encounter (Signed)
Pharmacy Patient Advocate Encounter  Received notification from [facility] that the request for prior authorization for Clorazepate has been denied due to  Please see below for suggested alternatives: Marland Kitchen    Please be advised we currently do not have a Pharmacist to review denials, therefore you will need to process appeals accordingly as needed. Thanks for your support at this time.   You may call [phone] or fax [phone], to appeal.

## 2022-05-23 NOTE — Telephone Encounter (Signed)
Pt called stating Dugal prescribed clorazepate (TRANXENE) 3.75 MG tablet, with a 3 month supply. Pt stated the meds required a PA. Pt states she spoke to her insurance & was suggested to request a year supply for meds from [facility]. Call back # [phone]

## 2022-05-24 NOTE — Telephone Encounter (Signed)
Clorazepate was denied by insurance. Is pt still taking this medication?   If yes, what other medications has she tried other than clorazepate such as alprazolam (xanax), klonipin (clonazepam), ativan (valium)

## 2022-05-24 NOTE — Telephone Encounter (Signed)
Spoke with the patient and confirmed that she has not tried anything else other than what she has which is clorazepate 3.'75mg'$  tabs. She wouldn't mind trying something different that would be cheaper. Patient just picked up a 90 day supply of Clorazepate and wants to know if something else is sent in, how long would it take to "go into effect". Patient said she was told by her insurance that all she really needed was a year supply of clorazepate sent in and a PA was not needed.The PA shows that this medication is a Tier 4 which costs quite a bit.

## 2022-05-25 MED ORDER — AMITRIPTYLINE HCL 50 MG PO TABS: 50.0000 mg | ORAL_TABLET | Freq: Every day | ORAL | 3 refills | Status: DC

## 2022-05-25 NOTE — Addendum Note (Signed)
Addended by: Eugenia Pancoast on: 05/25/2022 07:54 AM   Modules accepted: Orders

## 2022-05-25 NOTE — Telephone Encounter (Signed)
Clorazepate is a controlled substance so can not legally send one year supply. Also the goal is not to need to take clorazepate daily, this is a benzodiazepine and the goal is to get someone on a daily medication that does not cause long term side effects (such as memory loss and increased fatigue) that clorazepate can cause.   She is taking elavil right now, I would like to increase elavil to 50 mg once daily.  We can try another alternative such as alprazolam, pt to let me know when about due to refill clorazepate as will be unable to refill as she was just given a 90 day supply. Likely the timing should work out in time for her 6 month f/u which we can discuss this more at her visit. Sending in increased dose of elavil with the goal to improve anxiety to decrease need for use of clorazepate.

## 2022-05-25 NOTE — Telephone Encounter (Signed)
Called patient she will start the amitriptyline '50mg'$  daily and try to back up on the Clorazapate every other day. She does not need refill right now she just got filled. She will give [place] a call and let us know how her symptoms are after change.

## 2022-05-26 NOTE — Telephone Encounter (Signed)
Noted  

## 2022-05-31 ENCOUNTER — Telehealth (INDEPENDENT_AMBULATORY_CARE_PROVIDER_SITE_OTHER): Payer: Medicare HMO | Admitting: Family

## 2022-05-31 ENCOUNTER — Telehealth: Payer: Self-pay

## 2022-05-31 ENCOUNTER — Encounter: Payer: Self-pay | Admitting: Family

## 2022-05-31 VITALS — Temp 98.6°F | Ht 61.0 in | Wt 101.0 lb

## 2022-05-31 DIAGNOSIS — U071 COVID-19: Secondary | ICD-10-CM

## 2022-05-31 DIAGNOSIS — G43819 Other migraine, intractable, without status migrainosus: Secondary | ICD-10-CM | POA: Diagnosis not present

## 2022-05-31 MED ORDER — AMITRIPTYLINE HCL 25 MG PO TABS: 25.0000 mg | ORAL_TABLET | Freq: Every day | ORAL | 3 refills | Status: DC

## 2022-05-31 MED ORDER — MOLNUPIRAVIR EUA 200MG CAPSULE: 4.0000 | ORAL_CAPSULE | Freq: Two times a day (BID) | ORAL | 0 refills | Status: DC

## 2022-05-31 NOTE — Telephone Encounter (Signed)
Noted, will see pt as scheduled.

## 2022-05-31 NOTE — Telephone Encounter (Signed)
Pt called in and had + covid home test this morning; on 05/29/22 pt started with H/A (now pain level is 6) and dry cough and runny nose. No fever. No CP or SOB.pt said she feels "lousy". Pt scheduled VV with T Dugal FNP 05/31/22 at 12:40. Pt also wants to discuss amitriptyline dosage; see 05/23/22 phone note. UC & ED precautions given and pt voiced understanding.sending note to Red Christians FNP.

## 2022-05-31 NOTE — Progress Notes (Signed)
Virtual Visit via Video note  I connected with Kristen Caldwell on 05/31/22 at home by video and verified that I am speaking with the correct person using two identifiers. Kristen Caldwell is currently located at home. The provider, Eugenia Pancoast, FNP is located in their office at time of visit.  I discussed the limitations, risks, security and privacy concerns of performing an evaluation and management service by video and the availability of in person appointments. I also discussed with the patient that there may be a patient responsible charge related to this service. The patient expressed understanding and agreed to proceed.  Subjective: PCP: Eugenia Pancoast, FNP  Chief Complaint  Patient presents with   Headache    Cold symptoms. Covid home test was positive.    Headache     Started with sinus pressure and sinus headache three days ago. Sx worse yesterday and hard time sleeping,  She thought initially was her sinuses but then she tested herself for covid and was positive, tested positive today 3/19.  She has been taking mucinex, zyrtec and tylenol with only mild relief.   She does have a slight non productive cough. No chest congestion.ears are popping. No fever. Does have some hoarseness in her throat.  Migraines: increased elavil to 50 mg however made her feel 'over-dosed' and swimmy headed.   GAD: clorazepate, was on, but insurance stated was too expensive. Then she just received a letter from her insurance that stated that now it will be covered for the rest of the year.    ROS: Per HPI  Current Outpatient Medications:    amitriptyline (ELAVIL) 25 MG tablet, Take 1 tablet (25 mg total) by mouth at bedtime., Disp: 90 tablet, Rfl: 3   Calcium Carbonate-Vit D-Min (CALTRATE 600+D PLUS) 600-400 MG-UNIT per tablet, Take 2 tablets by mouth daily., Disp: , Rfl:    clorazepate (TRANXENE) 3.75 MG tablet, Take 1 tablet (3.75 mg total) by mouth daily., Disp: 90  tablet, Rfl: 0   Cranberry (AZO CRANBERRY GUMMIES) 250 MG CHEW, Chew 250 mg by mouth daily., Disp: , Rfl:    estradiol (ESTRACE) 0.5 MG tablet, Take 1/2 (0.25mg ) of a pill daily., Disp: 90 tablet, Rfl: 1   meclizine (ANTIVERT) 12.5 MG tablet, Take 1 tablet (12.5 mg total) by mouth 3 (three) times daily as needed for dizziness., Disp: 30 tablet, Rfl: 0   molnupiravir EUA (LAGEVRIO) 200 mg CAPS capsule, Take 4 capsules (800 mg total) by mouth 2 (two) times daily for 5 days., Disp: 40 capsule, Rfl: 0   Multiple Vitamins-Minerals (CENTRUM SILVER) CHEW, Chew 1 tablet by mouth daily., Disp: , Rfl:    Omega-3 Fatty Acids (FISH OIL PO), Take 1 capsule by mouth 2 (two) times daily., Disp: , Rfl:    omeprazole (PRILOSEC) 20 MG capsule, Take 1 capsule (20 mg total) by mouth daily., Disp: 90 capsule, Rfl: 1   ondansetron (ZOFRAN) 8 MG tablet, Take 1 tablet (8 mg total) by mouth every 8 (eight) hours as needed for nausea or vomiting., Disp: 30 tablet, Rfl: 2   topiramate (TOPAMAX) 25 MG tablet, TAKE 1 TABLET(25 MG) BY MOUTH TWICE DAILY, Disp: 180 tablet, Rfl: 2   TURMERIC PO, Take by mouth., Disp: , Rfl:    vitamin B-12 (CYANOCOBALAMIN) 1000 MCG tablet, Take 1,000 mcg by mouth daily., Disp: , Rfl:   Observations/Objective: Physical Exam Constitutional:      General: She is not in acute distress.    Appearance: Normal appearance.  She is not ill-appearing.  Pulmonary:     Effort: Pulmonary effort is normal.  Neurological:     General: No focal deficit present.     Mental Status: She is alert and oriented to person, place, and time.  Psychiatric:        Mood and Affect: Mood normal.        Behavior: Behavior normal.        Thought Content: Thought content normal.     Assessment and Plan: COVID-19 Assessment & Plan: Advised of CDC guidelines for self isolation/ ending isolation.  Advised of safe practice guidelines. Symptom Tier reviewed.  Encouraged to monitor for any worsening symptoms; watch for  increased shortness of breath, weakness, and signs of dehydration. Advised when to seek emergency care.  Instructed to rest and hydrate well.  Advised to leave the house during recommended isolation period, only if it is necessary to seek medical care Pt considered high risk for hospitalization. have decided pt is a candidate for antiviral and pt agrees that she would like to take this. I have sent in RX for molnupiravir 200 mg capsules to be taken as directed.   Orders: -     molnupiravir EUA; Take 4 capsules (800 mg total) by mouth 2 (two) times daily for 5 days.  Dispense: 40 capsule; Refill: 0  Other migraine without status migrainosus, intractable -     Amitriptyline HCl; Take 1 tablet (25 mg total) by mouth at bedtime.  Dispense: 90 tablet; Refill: 3    Follow Up Instructions: Return in about 3 months (around 08/31/2022) for f/u CPE.   I discussed the assessment and treatment plan with the patient. The patient was provided an opportunity to ask questions and all were answered. The patient agreed with the plan and demonstrated an understanding of the instructions.   The patient was advised to call back or seek an in-person evaluation if the symptoms worsen or if the condition fails to improve as anticipated.  The above assessment and management plan was discussed with the patient. The patient verbalized understanding of and has agreed to the management plan. Patient is aware to call the clinic if symptoms persist or worsen. Patient is aware when to return to the clinic for a follow-up visit. Patient educated on when it is appropriate to go to the emergency department.   Time call ended: 105  I provided 20 minutes of face-to-face time during this encounter.   Eugenia Pancoast, MSN, APRN, FNP-C [place]

## 2022-05-31 NOTE — Assessment & Plan Note (Signed)
Advised of CDC guidelines for self isolation/ ending isolation.  Advised of safe practice guidelines. Symptom Tier reviewed.  Encouraged to monitor for any worsening symptoms; watch for increased shortness of breath, weakness, and signs of dehydration. Advised when to seek emergency care.  Instructed to rest and hydrate well.  Advised to leave the house during recommended isolation period, only if it is necessary to seek medical care Pt considered high risk for hospitalization. have decided pt is a candidate for antiviral and pt agrees that she would like to take this. I have sent in RX for molnupiravir 200 mg capsules to be taken as directed.  

## 2022-05-31 NOTE — Patient Instructions (Signed)
Your COVID test is positive. You should remain isolated and quarantine for at least 5 days from start of symptoms. You must be feeling better and be fever free without any fever reducers for at least 24 hours as well. You should wear a mask at all times when out of your home or around others for 5 days after leaving isolation.  Your household contacts should be tested as well as work contacts. If you feel worse or have increasing shortness of breath, you should be seen in person at urgent care or the emergency room.   

## 2022-06-03 ENCOUNTER — Encounter: Payer: Self-pay | Admitting: Family

## 2022-06-03 ENCOUNTER — Telehealth: Payer: Self-pay | Admitting: Family

## 2022-06-03 NOTE — Telephone Encounter (Signed)
Patient called in and stated that  molnupiravir EUA (LAGEVRIO) 200 mg CAPS capsule is causing her to feel nauseous. She was wanting to know if she can just stop taking them. Please advise. Thank you!

## 2022-06-03 NOTE — Telephone Encounter (Signed)
Sending to [place] for advice.

## 2022-06-03 NOTE — Telephone Encounter (Signed)
Patient called back in and would like to know if it will be okay to take ondansetrion(zofran )for her nausea from the antiviral that she is taking for covid?

## 2022-06-06 NOTE — Addendum Note (Signed)
Addended by: Vaughan Browner on: 06/06/2022 03:23 PM   Modules accepted: Orders

## 2022-06-06 NOTE — Telephone Encounter (Signed)
Spoke with the patient and advised as instructed. Pt understood and agrees with plan. Pt now has a rash on her stomach from the medication.She is scheduled to be seen tomorrow.

## 2022-06-06 NOTE — Telephone Encounter (Signed)
Pt wants advice on what to do. See previous message.

## 2022-06-06 NOTE — Telephone Encounter (Signed)
Pt can stop antiviral.  In future,

## 2022-06-06 NOTE — Telephone Encounter (Signed)
This is fine 

## 2022-06-07 ENCOUNTER — Ambulatory Visit (INDEPENDENT_AMBULATORY_CARE_PROVIDER_SITE_OTHER): Payer: Medicare HMO | Admitting: Family

## 2022-06-07 ENCOUNTER — Encounter: Payer: Self-pay | Admitting: Family

## 2022-06-07 VITALS — BP 130/82 | HR 99 | Temp 97.9°F | Ht 61.0 in | Wt 100.8 lb

## 2022-06-07 DIAGNOSIS — L270 Generalized skin eruption due to drugs and medicaments taken internally: Secondary | ICD-10-CM | POA: Insufficient documentation

## 2022-06-07 DIAGNOSIS — U071 COVID-19: Secondary | ICD-10-CM

## 2022-06-07 DIAGNOSIS — R110 Nausea: Secondary | ICD-10-CM | POA: Diagnosis not present

## 2022-06-07 MED ORDER — ONDANSETRON HCL 4 MG PO TABS: 4.0000 mg | ORAL_TABLET | Freq: Three times a day (TID) | ORAL | 0 refills | Status: DC | PRN

## 2022-06-07 MED ORDER — TRIAMCINOLONE ACETONIDE 0.1 % EX CREA: 1.0000 | TOPICAL_CREAM | Freq: Two times a day (BID) | CUTANEOUS | 0 refills | Status: AC

## 2022-06-07 MED ORDER — METHYLPREDNISOLONE 4 MG PO TBPK: ORAL_TABLET | ORAL | 0 refills | Status: DC

## 2022-06-07 NOTE — Progress Notes (Signed)
Established Patient Office Visit  Subjective:   Patient ID: Kristen Caldwell, female    DOB: January 03, 1946  Age: 77 y.o. MRN: [id number]  CC:  Chief Complaint  Patient presents with   Rash    HPI: Kristen Caldwell is a 77 y.o. female presenting on 06/07/2022 for Rash   Rash    Diagnosed with covid last week 3/19  Was given RX for antiviral molnupiravir and almost right after had nausea. She stopped the antiviral medication two days later. Three days later she noticed a rash on her abdomen, and upper back, itchy. Putting on cortisone cream otc.   Also feels a coating on her tongue.  Otherwise her covid symptoms have improved, and feels much better.  Nausea almost completely improved.        ROS: Negative unless specifically indicated above in HPI.   Relevant past medical history reviewed and updated as indicated.   Allergies and medications reviewed and updated.   Current Outpatient Medications:    amitriptyline (ELAVIL) 25 MG tablet, Take 1 tablet (25 mg total) by mouth at bedtime., Disp: 90 tablet, Rfl: 3   Calcium Carbonate-Vit D-Min (CALTRATE 600+D PLUS) 600-400 MG-UNIT per tablet, Take 2 tablets by mouth daily., Disp: , Rfl:    clorazepate (TRANXENE) 3.75 MG tablet, Take 1 tablet (3.75 mg total) by mouth daily., Disp: 90 tablet, Rfl: 0   Cranberry (AZO CRANBERRY GUMMIES) 250 MG CHEW, Chew 250 mg by mouth daily., Disp: , Rfl:    estradiol (ESTRACE) 0.5 MG tablet, Take 1/2 (0.25mg ) of a pill daily., Disp: 90 tablet, Rfl: 1   meclizine (ANTIVERT) 12.5 MG tablet, Take 1 tablet (12.5 mg total) by mouth 3 (three) times daily as needed for dizziness., Disp: 30 tablet, Rfl: 0   methylPREDNISolone (MEDROL DOSEPAK) 4 MG TBPK tablet, Take per package instructions, Disp: 21 tablet, Rfl: 0   Multiple Vitamins-Minerals (CENTRUM SILVER) CHEW, Chew 1 tablet by mouth daily., Disp: , Rfl:    Omega-3 Fatty Acids (FISH OIL PO), Take 1 capsule by mouth 2 (two) times  daily., Disp: , Rfl:    omeprazole (PRILOSEC) 20 MG capsule, Take 1 capsule (20 mg total) by mouth daily., Disp: 90 capsule, Rfl: 1   ondansetron (ZOFRAN) 4 MG tablet, Take 1 tablet (4 mg total) by mouth every 8 (eight) hours as needed for nausea or vomiting., Disp: 20 tablet, Rfl: 0   topiramate (TOPAMAX) 25 MG tablet, TAKE 1 TABLET(25 MG) BY MOUTH TWICE DAILY, Disp: 180 tablet, Rfl: 2   triamcinolone cream (KENALOG) 0.1 %, Apply 1 Application topically 2 (two) times daily for 14 days., Disp: 28 g, Rfl: 0   TURMERIC PO, Take by mouth., Disp: , Rfl:    vitamin B-12 (CYANOCOBALAMIN) 1000 MCG tablet, Take 1,000 mcg by mouth daily., Disp: , Rfl:   Allergies  Allergen Reactions   Cefuroxime Nausea Only   Lagevrio [Molnupiravir] Nausea Only    Rash    Amoxicillin Nausea Only    REACTION: NAUSEA   Cephalexin     REACTION: NAUSEA AND VOMITING   Promethazine Hcl Rash   Sertraline Diarrhea   Sulfa Antibiotics Nausea Only    Objective:   BP 130/82   Pulse 99   Temp 97.9 F (36.6 C) (Temporal)   Ht 5\' 1"  (1.549 m)   Wt 100 lb 12.8 oz (45.7 kg)   SpO2 98%   BMI 19.05 kg/m    Physical Exam Constitutional:      General: She  is not in acute distress.    Appearance: Normal appearance. She is normal weight. She is not ill-appearing, toxic-appearing or diaphoretic.  HENT:     Head: Normocephalic.     Right Ear: A middle ear effusion (clear) is present.     Left Ear: Tympanic membrane normal.     Nose: Nose normal.     Mouth/Throat:     Mouth: Mucous membranes are dry.     Pharynx: No oropharyngeal exudate or posterior oropharyngeal erythema.  Eyes:     Extraocular Movements: Extraocular movements intact.     Pupils: Pupils are equal, round, and reactive to light.  Cardiovascular:     Rate and Rhythm: Normal rate and regular rhythm.     Pulses: Normal pulses.     Heart sounds: Normal heart sounds.  Pulmonary:     Effort: Pulmonary effort is normal.     Breath sounds: Normal breath  sounds.  Genitourinary:    General: Normal vulva.  Musculoskeletal:     Cervical back: Normal range of motion.  Skin:    Findings: Rash present. Rash is papular (localized abdominal and lower back papular rash with slight erythema).  Neurological:     General: No focal deficit present.     Mental Status: She is alert and oriented to person, place, and time. Mental status is at baseline.  Psychiatric:        Mood and Affect: Mood normal.        Behavior: Behavior normal.        Thought Content: Thought content normal.        Judgment: Judgment normal.     Assessment & Plan:  Drug-induced skin rash Assessment & Plan: Suspect rash is a reaction from [place]. Rx sent to patient's pharmacy for Medrol dose pack and Kenalog 0.1% twice daily for 14 days. Advised patient to continue taking Zyrtec daily. Notify provider if symptoms do not improve or worsen.   Orders: -     methylPREDNISolone; Take per package instructions  Dispense: 21 tablet; Refill: 0 -     Triamcinolone Acetonide; Apply 1 Application topically 2 (two) times daily for 14 days.  Dispense: 28 g; Refill: 0  Nausea Assessment & Plan: Even though improved, patient continue to have nausea.  Rx sent to patient's pharmacy for Zofran 4mg  every 8 hours as needed.   I evaluated the patient,  was consulted regarding plans for treatment of care, and agree with the assessment and plan per Joya Gaskins, RN, DNP student.  Eugenia Pancoast, FNP-C   Orders: -     Ondansetron HCl; Take 1 tablet (4 mg total) by mouth every 8 (eight) hours as needed for nausea or vomiting.  Dispense: 20 tablet; Refill: 0  COVID     Follow up plan: Return in about 3 months (around 09/07/2022) for f/u CPE.  Eugenia Pancoast, FNP

## 2022-06-07 NOTE — Assessment & Plan Note (Addendum)
Even though improved, patient continue to have nausea.  Rx sent to patient's pharmacy for Zofran 4mg  every 8 hours as needed.   I evaluated the patient,  was consulted regarding plans for treatment of care, and agree with the assessment and plan per Joya Gaskins, RN, DNP student.  -Eugenia Pancoast, FNP-C

## 2022-06-07 NOTE — Progress Notes (Signed)
Established Patient Office Visit  Subjective:   Patient ID: Kristen Caldwell, female    DOB: 10-24-1945  Age: 77 y.o. MRN: [id number]  CC:  Chief Complaint  Patient presents with   Rash    HPI: Kristen Caldwell is a 77 y.o. female presenting today for rash.  Patient diagnosed with COVID 19 on 3/19 and given Molnupravir 800mg  BID for 5 days. Patient started feeling nauseous one day after starting medication and continued taking medication until Saturday morning. One Sunday patient noticed a rash on stomach when she was taking a shower. Rash is on stomach, neck, and mid back. Patient states it feels like a tingling more than itching. Patient has been using OTC Cortisone cream and Zyrtec which has been giving her some relief. Patient also reports mouth feel like there is a "coating" on the inside.  Patient has not any new changes in soaps, detergents, food, and lotions.  She still feels a little nauseous but states it has significantly improved. She reports that COVID symptoms are completely resolved. Denies fever and chills.     ROS: Negative unless specifically indicated above in HPI.   Relevant past medical history reviewed and updated as indicated.   Allergies and medications reviewed and updated.   Current Outpatient Medications:    amitriptyline (ELAVIL) 25 MG tablet, Take 1 tablet (25 mg total) by mouth at bedtime., Disp: 90 tablet, Rfl: 3   Calcium Carbonate-Vit D-Min (CALTRATE 600+D PLUS) 600-400 MG-UNIT per tablet, Take 2 tablets by mouth daily., Disp: , Rfl:    clorazepate (TRANXENE) 3.75 MG tablet, Take 1 tablet (3.75 mg total) by mouth daily., Disp: 90 tablet, Rfl: 0   Cranberry (AZO CRANBERRY GUMMIES) 250 MG CHEW, Chew 250 mg by mouth daily., Disp: , Rfl:    estradiol (ESTRACE) 0.5 MG tablet, Take 1/2 (0.25mg ) of a pill daily., Disp: 90 tablet, Rfl: 1   meclizine (ANTIVERT) 12.5 MG tablet, Take 1 tablet (12.5 mg total) by mouth 3 (three) times daily  as needed for dizziness., Disp: 30 tablet, Rfl: 0   methylPREDNISolone (MEDROL DOSEPAK) 4 MG TBPK tablet, Take per package instructions, Disp: 21 tablet, Rfl: 0   Multiple Vitamins-Minerals (CENTRUM SILVER) CHEW, Chew 1 tablet by mouth daily., Disp: , Rfl:    Omega-3 Fatty Acids (FISH OIL PO), Take 1 capsule by mouth 2 (two) times daily., Disp: , Rfl:    omeprazole (PRILOSEC) 20 MG capsule, Take 1 capsule (20 mg total) by mouth daily., Disp: 90 capsule, Rfl: 1   ondansetron (ZOFRAN) 4 MG tablet, Take 1 tablet (4 mg total) by mouth every 8 (eight) hours as needed for nausea or vomiting., Disp: 20 tablet, Rfl: 0   topiramate (TOPAMAX) 25 MG tablet, TAKE 1 TABLET(25 MG) BY MOUTH TWICE DAILY, Disp: 180 tablet, Rfl: 2   triamcinolone cream (KENALOG) 0.1 %, Apply 1 Application topically 2 (two) times daily for 14 days., Disp: 28 g, Rfl: 0   TURMERIC PO, Take by mouth., Disp: , Rfl:    vitamin B-12 (CYANOCOBALAMIN) 1000 MCG tablet, Take 1,000 mcg by mouth daily., Disp: , Rfl:   Allergies  Allergen Reactions   Cefuroxime Nausea Only   Lagevrio [Molnupiravir] Nausea Only    Rash    Amoxicillin Nausea Only    REACTION: NAUSEA   Cephalexin     REACTION: NAUSEA AND VOMITING   Promethazine Hcl Rash   Sertraline Diarrhea   Sulfa Antibiotics Nausea Only    Objective:   BP 130/82  Pulse 99   Temp 97.9 F (36.6 C) (Temporal)   Ht 5\' 1"  (1.549 m)   Wt 100 lb 12.8 oz (45.7 kg)   SpO2 98%   BMI 19.05 kg/m    Physical Exam Vitals reviewed.  Constitutional:      General: She is not in acute distress.    Appearance: Normal appearance. She is normal weight. She is not ill-appearing, toxic-appearing or diaphoretic.  HENT:     Head: Normocephalic.     Right Ear: Hearing, ear canal and external ear normal. A middle ear effusion is present.     Left Ear: Hearing, tympanic membrane, ear canal and external ear normal.     Nose: Nose normal.     Mouth/Throat:     Mouth: Mucous membranes are dry.      Pharynx: Oropharynx is clear.  Eyes:     Extraocular Movements: Extraocular movements intact.     Conjunctiva/sclera: Conjunctivae normal.     Pupils: Pupils are equal, round, and reactive to light.  Cardiovascular:     Rate and Rhythm: Normal rate and regular rhythm.     Heart sounds: Normal heart sounds.  Pulmonary:     Effort: Pulmonary effort is normal.     Breath sounds: Normal breath sounds.  Abdominal:     General: Abdomen is flat.     Palpations: Abdomen is soft.  Skin:    General: Skin is warm and dry.     Capillary Refill: Capillary refill takes less than 2 seconds.     Findings: Rash present. Rash is papular (small papular rash on abdomen and lower back with mild erythema).  Neurological:     General: No focal deficit present.     Mental Status: She is alert and oriented to person, place, and time.  Psychiatric:        Mood and Affect: Mood normal.        Behavior: Behavior normal.        Thought Content: Thought content normal.        Judgment: Judgment normal.     Assessment & Plan:  Drug-induced skin rash Assessment & Plan: Suspect rash is a reaction from [place]. Rx sent to patient's pharmacy for Medrol dose pack and Kenalog 0.1% twice daily for 14 days. Advised patient to continue taking Zyrtec daily. Notify provider if symptoms do not improve or worsen.   Orders: -     methylPREDNISolone; Take per package instructions  Dispense: 21 tablet; Refill: 0 -     Triamcinolone Acetonide; Apply 1 Application topically 2 (two) times daily for 14 days.  Dispense: 28 g; Refill: 0  Nausea Assessment & Plan: Even though improved, patient continue to have nausea.  Rx sent to patient's pharmacy for Zofran 4mg  every 8 hours as needed.   I evaluated the patient,  was consulted regarding plans for treatment of care, and agree with the assessment and plan per Joya Gaskins, RN, DNP student.  Eugenia Pancoast, FNP-C   Orders: -     Ondansetron HCl; Take 1 tablet (4 mg  total) by mouth every 8 (eight) hours as needed for nausea or vomiting.  Dispense: 20 tablet; Refill: 0  COVID     Follow up plan: Return in about 3 months (around 09/07/2022) for f/u CPE.  Eugenia Pancoast, FNP AGNP-student

## 2022-06-07 NOTE — Assessment & Plan Note (Signed)
Suspect rash is a reaction from [place]. Rx sent to patient's pharmacy for Medrol dose pack and Kenalog 0.1% twice daily for 14 days. Advised patient to continue taking Zyrtec daily. Notify provider if symptoms do not improve or worsen.

## 2022-06-20 ENCOUNTER — Telehealth: Payer: Self-pay

## 2022-06-20 DIAGNOSIS — Z79890 Hormone replacement therapy (postmenopausal): Secondary | ICD-10-CM

## 2022-06-20 NOTE — Telephone Encounter (Signed)
Received refill request. Last given by Dr. Selena Batten. Wanted to check to see if this is something you prescribe or if she needs to go to GYN?

## 2022-06-21 MED ORDER — ESTRADIOL 0.5 MG PO TABS: ORAL_TABLET | ORAL | 0 refills | Status: DC

## 2022-06-21 NOTE — Telephone Encounter (Signed)
Spoke with the patient and confirmed that she takes 1/2 tab for 5 days. She will try to wing herself down and eventually decrease to 4 days by next week. She states she still gets dry and does not like the feeling without taking this medication. Pt does not need the refill at this moment as she has some still left.

## 2022-06-21 NOTE — Telephone Encounter (Signed)
Pt is to decrease even more.  Can we verify is she currently taking it six days a week?  If yes we need to decrease to 1/2 tablet (0.25 mg) 5 days a week x one week, and go down by one pill each week until further decreased to 0. At this age it poses more of a risk for hormonal cancers. I will send in thirty day supply with no future refills.

## 2022-06-22 NOTE — Telephone Encounter (Signed)
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  I do not see this in the chart but this medication is given for this condition.  If she actively has vaginal atrophy we can continue with this as is.  Of course please remind her of risk factors for ongoing hormonal therapy to include increased risk for endometrial, ovarian and breast cancer. Increased risk for dementia and cardiovascular disease.

## 2022-06-23 NOTE — Telephone Encounter (Signed)
Patient states that several years ago she was seen by GYN and thinks that is why she was given it. Has tried to get off before and had very bad dryness and itching. It helps with all symptoms. Reviewed risk factors and patient is aware.

## 2022-07-10 ENCOUNTER — Other Ambulatory Visit: Payer: Self-pay | Admitting: Family

## 2022-07-10 DIAGNOSIS — Z79890 Hormone replacement therapy (postmenopausal): Secondary | ICD-10-CM

## 2022-07-11 ENCOUNTER — Encounter: Payer: Self-pay | Admitting: Family

## 2022-07-11 DIAGNOSIS — N952 Postmenopausal atrophic vaginitis: Secondary | ICD-10-CM | POA: Insufficient documentation

## 2022-08-09 ENCOUNTER — Other Ambulatory Visit: Payer: Self-pay | Admitting: Family

## 2022-08-09 DIAGNOSIS — F410 Panic disorder [episodic paroxysmal anxiety] without agoraphobia: Secondary | ICD-10-CM

## 2022-08-30 ENCOUNTER — Encounter: Payer: Self-pay | Admitting: Family

## 2022-08-30 ENCOUNTER — Ambulatory Visit (INDEPENDENT_AMBULATORY_CARE_PROVIDER_SITE_OTHER): Payer: Medicare HMO | Admitting: Family

## 2022-08-30 VITALS — BP 126/82 | HR 81 | Temp 98.0°F | Ht 61.0 in | Wt 101.0 lb

## 2022-08-30 DIAGNOSIS — I998 Other disorder of circulatory system: Secondary | ICD-10-CM

## 2022-08-30 DIAGNOSIS — Z1231 Encounter for screening mammogram for malignant neoplasm of breast: Secondary | ICD-10-CM

## 2022-08-30 DIAGNOSIS — I739 Peripheral vascular disease, unspecified: Secondary | ICD-10-CM | POA: Insufficient documentation

## 2022-08-30 DIAGNOSIS — M79606 Pain in leg, unspecified: Secondary | ICD-10-CM

## 2022-08-30 NOTE — Patient Instructions (Addendum)
  I have ordered imaging for you at [facility] outpatient diagnostic center. This order has been sent over for you electronically.  They should call you directly to get this scheduled.    Regards,   Mort Sawyers FNP-C

## 2022-08-30 NOTE — Assessment & Plan Note (Signed)
Ordering venous duplex as well as ABI pending results.  R/o ciruclatory concerns.  Mild varisose veins, mild decreased PT pulse on right side Advise DTaP to wear compression stockings elevate legs at night.

## 2022-08-30 NOTE — Progress Notes (Signed)
Established Patient Office Visit  Subjective:   Patient ID: Kristen Caldwell, female    DOB: 04/20/1945  Age: 77 y.o. MRN: [id number]  CC: No chief complaint on file.   HPI: Kristen Caldwell is a 77 y.o. female presenting on 08/30/2022 for No chief complaint on file.  Bil lower extremity pain mainly in both of her feet after she has stood on them for most of the day. Has tried compression stockings with mild improvement, feels better when she has them on.  Does not feel cold.  Painful with walking mild.  Pain with rest sometimes.   No h/o smoking.  Has been soaking them in epsom salt with some relief too.  No real swelling seen in the lower extremities.  Has been to medical massage for this as well with mild improvement.         ROS: Negative unless specifically indicated above in HPI.   Relevant past medical history reviewed and updated as indicated.   Allergies and medications reviewed and updated.   Current Outpatient Medications:    amitriptyline (ELAVIL) 25 MG tablet, Take 1 tablet (25 mg total) by mouth at bedtime., Disp: 90 tablet, Rfl: 3   Calcium Carbonate-Vit D-Min (CALTRATE 600+D PLUS) 600-400 MG-UNIT per tablet, Take 2 tablets by mouth daily., Disp: , Rfl:    clorazepate (TRANXENE) 3.75 MG tablet, Take 1 tablet by mouth once daily, Disp: 90 tablet, Rfl: 0   Cranberry (AZO CRANBERRY GUMMIES) 250 MG CHEW, Chew 250 mg by mouth daily., Disp: , Rfl:    estradiol (ESTRACE) 0.5 MG tablet, Take 0.5 tablets (0.25 mg total) by mouth daily for 6 days, THEN 0.5 tablets (0.25 mg total) daily for 5 days, THEN 0.5 tablets (0.25 mg total) daily for 4 days, THEN 0.5 tablets (0.25 mg total) daily for 3 days, THEN 0.5 tablets (0.25 mg total) daily for 2 days, THEN 0.5 tablets (0.25 mg total) daily for 1 day., Disp: 11 tablet, Rfl: 0   meclizine (ANTIVERT) 12.5 MG tablet, Take 1 tablet (12.5 mg total) by mouth 3 (three) times daily as needed for dizziness., Disp:  30 tablet, Rfl: 0   Multiple Vitamins-Minerals (CENTRUM SILVER) CHEW, Chew 1 tablet by mouth daily., Disp: , Rfl:    Omega-3 Fatty Acids (FISH OIL PO), Take 1 capsule by mouth 2 (two) times daily., Disp: , Rfl:    omeprazole (PRILOSEC) 20 MG capsule, Take 1 capsule (20 mg total) by mouth daily., Disp: 90 capsule, Rfl: 1   ondansetron (ZOFRAN) 4 MG tablet, Take 1 tablet (4 mg total) by mouth every 8 (eight) hours as needed for nausea or vomiting., Disp: 20 tablet, Rfl: 0   topiramate (TOPAMAX) 25 MG tablet, TAKE 1 TABLET(25 MG) BY MOUTH TWICE DAILY, Disp: 180 tablet, Rfl: 2   TURMERIC PO, Take by mouth., Disp: , Rfl:    vitamin B-12 (CYANOCOBALAMIN) 1000 MCG tablet, Take 1,000 mcg by mouth daily., Disp: , Rfl:   Allergies  Allergen Reactions   Cefuroxime Nausea Only   Lagevrio [Molnupiravir] Nausea Only    Rash    Amoxicillin Nausea Only    REACTION: NAUSEA   Cephalexin     REACTION: NAUSEA AND VOMITING   Promethazine Hcl Rash   Sertraline Diarrhea   Sulfa Antibiotics Nausea Only    Objective:   BP 126/82   Pulse 81   Temp 98 F (36.7 C) (Temporal)   Ht 5\' 1"  (1.549 m)   Wt 101 lb (45.8 kg)  SpO2 97%   BMI 19.08 kg/m    Physical Exam Cardiovascular:     Pulses:          Dorsalis pedis pulses are 2+ on the right side and 2+ on the left side.       Posterior tibial pulses are 1+ on the right side and 2+ on the left side.     Comments: Purple discoloration of lower extremities without pain on palpation with cold temperature Musculoskeletal:     Right lower leg: No edema.     Left lower leg: No edema.     Assessment & Plan:  Ischaemic rest pain of lower extremity Assessment & Plan: Ordering venous duplex as well as ABI pending results.  R/o ciruclatory concerns.  Mild varisose veins, mild decreased PT pulse on right side Advise DTaP to wear compression stockings elevate legs at night.    Intermittent claudication (HCC)  Screening mammogram for breast cancer      Follow up plan: Return if symptoms worsen or fail to improve.  Mort Sawyers, FNP

## 2022-09-06 ENCOUNTER — Telehealth: Payer: Self-pay | Admitting: Family

## 2022-09-06 NOTE — Telephone Encounter (Signed)
Patient contacted the office regarding order placed for an ultrasound and mammogram. Patient says no one had contacted here regarding her ultrasound yet, gave patient number to call to follow up on that request. Advised that if she had any issues with scheduling to let us know. Patient stated she is having her mammogram done on 7/1.

## 2022-09-12 DIAGNOSIS — Z1231 Encounter for screening mammogram for malignant neoplasm of breast: Secondary | ICD-10-CM | POA: Diagnosis not present

## 2022-09-12 LAB — HM MAMMOGRAPHY

## 2022-09-12 NOTE — Telephone Encounter (Signed)
Can we look into the VAS ABI and u/s lower extremity order? Pt states has not been called to get scheduled.

## 2022-09-12 NOTE — Telephone Encounter (Signed)
Patient contacted the office again regarding this request, I am showing an order for this but outpatient imaging does not have information on this order. Please advise, patient has not had this done yet

## 2022-09-12 NOTE — Telephone Encounter (Signed)
Tanya,   Are you able to schedule these two tests?   Please advise, thanks.

## 2022-09-12 NOTE — Telephone Encounter (Signed)
Patient states there is no issue with the mammogram. However when she was here 06/18 for office visit she was told she would have an image done on her feet. In the OV note I see it says imaging ordered at [facility], but do not see order in chart. Please advise.

## 2022-09-12 NOTE — Telephone Encounter (Signed)
Can we call pt for clarification? I don't understand. I have an active mammogram order that I put in 6/18. Has pt called to schedule this?

## 2022-09-17 DIAGNOSIS — J0100 Acute maxillary sinusitis, unspecified: Secondary | ICD-10-CM | POA: Diagnosis not present

## 2022-09-17 DIAGNOSIS — R519 Headache, unspecified: Secondary | ICD-10-CM | POA: Diagnosis not present

## 2022-09-19 NOTE — Telephone Encounter (Signed)
Kristen Caldwell-  Patient is coming in tomorrow afternoon.  Thank you!   [name]

## 2022-09-20 ENCOUNTER — Other Ambulatory Visit: Payer: Self-pay | Admitting: Family

## 2022-09-20 ENCOUNTER — Ambulatory Visit (INDEPENDENT_AMBULATORY_CARE_PROVIDER_SITE_OTHER): Payer: Medicare HMO

## 2022-09-20 DIAGNOSIS — M79671 Pain in right foot: Secondary | ICD-10-CM

## 2022-09-20 DIAGNOSIS — Z1231 Encounter for screening mammogram for malignant neoplasm of breast: Secondary | ICD-10-CM

## 2022-09-20 DIAGNOSIS — I739 Peripheral vascular disease, unspecified: Secondary | ICD-10-CM

## 2022-09-20 DIAGNOSIS — I998 Other disorder of circulatory system: Secondary | ICD-10-CM

## 2022-09-20 DIAGNOSIS — M79606 Pain in leg, unspecified: Secondary | ICD-10-CM

## 2022-09-20 DIAGNOSIS — M79672 Pain in left foot: Secondary | ICD-10-CM | POA: Diagnosis not present

## 2022-09-21 LAB — VAS US ABI WITH/WO TBI
Left ABI: 1
Right ABI: 1.02

## 2022-09-24 ENCOUNTER — Other Ambulatory Visit: Payer: Self-pay | Admitting: Family

## 2022-09-24 DIAGNOSIS — K219 Gastro-esophageal reflux disease without esophagitis: Secondary | ICD-10-CM

## 2022-10-10 ENCOUNTER — Telehealth: Payer: Self-pay

## 2022-10-10 DIAGNOSIS — G43819 Other migraine, intractable, without status migrainosus: Secondary | ICD-10-CM

## 2022-10-10 NOTE — Telephone Encounter (Signed)
90 days with three refills, 1 year supply, was sent in 05/2022. Pt shouldn't need refill.

## 2022-10-11 NOTE — Telephone Encounter (Signed)
My chart sent to patient to let know.  

## 2022-10-27 ENCOUNTER — Encounter (INDEPENDENT_AMBULATORY_CARE_PROVIDER_SITE_OTHER): Payer: Self-pay

## 2022-11-03 ENCOUNTER — Other Ambulatory Visit: Payer: Self-pay | Admitting: Family

## 2022-11-03 DIAGNOSIS — F410 Panic disorder [episodic paroxysmal anxiety] without agoraphobia: Secondary | ICD-10-CM

## 2022-11-17 ENCOUNTER — Ambulatory Visit (INDEPENDENT_AMBULATORY_CARE_PROVIDER_SITE_OTHER): Payer: Medicare HMO | Admitting: *Deleted

## 2022-11-17 DIAGNOSIS — Z23 Encounter for immunization: Secondary | ICD-10-CM | POA: Diagnosis not present

## 2022-11-21 ENCOUNTER — Ambulatory Visit (INDEPENDENT_AMBULATORY_CARE_PROVIDER_SITE_OTHER): Payer: Medicare HMO

## 2022-11-21 VITALS — Ht 61.0 in | Wt 101.0 lb

## 2022-11-21 DIAGNOSIS — Z0000 Encounter for general adult medical examination without abnormal findings: Secondary | ICD-10-CM | POA: Diagnosis not present

## 2022-11-21 DIAGNOSIS — Z780 Asymptomatic menopausal state: Secondary | ICD-10-CM

## 2022-11-21 NOTE — Patient Instructions (Addendum)
[name] , Thank you for taking time to come for your Medicare Wellness Visit. I appreciate your ongoing commitment to your health goals. Please review the following plan we discussed and let me know if I can assist you in the future.   Referrals/Orders/Follow-Ups/Clinician Recommendations: Aim for 30 minutes of exercise or brisk walking, 6-8 glasses of water, and 5 servings of fruits and vegetables each day.   You have an order for:  []   2D Mammogram  []   3D Mammogram  [x]   Bone Density     Please call for appointment:  [facility] [address] [place], [place] [address] [phone]  [facility] [address] [place], [place] [address] [phone]  [facility] [place] [address] [place], [place] [address] [phone]  [facility] [place] [facility] [address] [place], [place] [address] [phone]  [facility] [address] [place], [place] [address] [phone]    Make sure to wear two-piece clothing.  No lotions, powders, or deodorants the day of the appointment. Make sure to bring picture ID and insurance card.  Bring list of medications you are currently taking including any supplements.   Schedule your [place] screening mammogram through MyChart!   Log into your MyChart account.  Go to 'Visit' (or 'Appointments' if on mobile App) --> Schedule an Appointment  Under 'Select a Reason for Visit' choose the Mammogram Screening option.  Complete the pre-visit questions and select the time and place that best fits your schedule.    This is a list of the screening recommended for you and due dates:  Health Maintenance  Topic Date Due   COVID-19 Vaccine (4 - 2023-24 season) 11/13/2022   Medicare Annual Wellness Visit  11/17/2022   Mammogram  09/12/2023   Colon Cancer Screening  12/14/2024   DTaP/Tdap/Td vaccine (3 - Td or Tdap)  07/02/2026   Pneumonia Vaccine  Completed   Flu Shot  Completed   DEXA scan (bone density measurement)  Completed   Hepatitis C Screening  Completed   Zoster (Shingles) Vaccine  Completed   HPV Vaccine  Aged Out    Advanced directives: (Declined) Advance directive discussed with you today. Even though you declined this today, please call our office should you change your mind, and we can give you the proper paperwork for you to fill out.  Next Medicare Annual Wellness Visit scheduled for next year: Yes

## 2022-11-21 NOTE — Progress Notes (Signed)
Subjective:   Kristen Caldwell is a 77 y.o. female who presents for Medicare Annual (Subsequent) preventive examination.  Visit Complete: Virtual  I connected with  Kristen Caldwell on 11/21/22 by a audio enabled telemedicine application and verified that I am speaking with the correct person using two identifiers.  Patient Location: Home  Provider Location: Home Office  I discussed the limitations of evaluation and management by telemedicine. The patient expressed understanding and agreed to proceed.  Vital Signs: Because this visit was a virtual/telehealth visit, some criteria may be missing or patient reported. Any vitals not documented were not able to be obtained and vitals that have been documented are patient reported.    Review of Systems      Cardiac Risk Factors include: advanced age (>75men, >60 women);sedentary lifestyle     Objective:    Today's Vitals   11/21/22 0818  Weight: 101 lb (45.8 kg)  Height: 5\' 1"  (1.549 m)   Body mass index is 19.08 kg/m.     11/21/2022    8:27 AM 12/14/2021    9:49 AM 11/16/2021    8:56 AM 11/12/2020    2:15 PM 05/12/2020    9:26 AM 11/08/2019    3:37 PM 11/02/2018    9:03 AM  Advanced Directives  Does Patient Have a Medical Advance Directive? No No No No No No No  Would patient like information on creating a medical advance directive? No - Patient declined  No - Patient declined Yes (MAU/Ambulatory/Procedural Areas - Information given)  No - Patient declined     Current Medications (verified) Outpatient Encounter Medications as of 11/21/2022  Medication Sig   amitriptyline (ELAVIL) 25 MG tablet Take 1 tablet (25 mg total) by mouth at bedtime.   Calcium Carbonate-Vit D-Min (CALTRATE 600+D PLUS) 600-400 MG-UNIT per tablet Take 2 tablets by mouth daily.   clorazepate (TRANXENE) 3.75 MG tablet Take 1 tablet by mouth once daily   Cranberry (AZO CRANBERRY GUMMIES) 250 MG CHEW Chew 250 mg by mouth daily.   meclizine  (ANTIVERT) 12.5 MG tablet Take 1 tablet (12.5 mg total) by mouth 3 (three) times daily as needed for dizziness.   Multiple Vitamins-Minerals (CENTRUM SILVER) CHEW Chew 1 tablet by mouth daily.   Omega-3 Fatty Acids (FISH OIL PO) Take 1 capsule by mouth 2 (two) times daily.   omeprazole (PRILOSEC) 20 MG capsule Take 1 capsule by mouth once daily   ondansetron (ZOFRAN) 4 MG tablet Take 1 tablet (4 mg total) by mouth every 8 (eight) hours as needed for nausea or vomiting.   topiramate (TOPAMAX) 25 MG tablet TAKE 1 TABLET(25 MG) BY MOUTH TWICE DAILY   TURMERIC PO Take by mouth.   vitamin B-12 (CYANOCOBALAMIN) 1000 MCG tablet Take 1,000 mcg by mouth daily.   estradiol (ESTRACE) 0.5 MG tablet Take 0.5 tablets (0.25 mg total) by mouth daily for 6 days, THEN 0.5 tablets (0.25 mg total) daily for 5 days, THEN 0.5 tablets (0.25 mg total) daily for 4 days, THEN 0.5 tablets (0.25 mg total) daily for 3 days, THEN 0.5 tablets (0.25 mg total) daily for 2 days, THEN 0.5 tablets (0.25 mg total) daily for 1 day.   No facility-administered encounter medications on file as of 11/21/2022.    Allergies (verified) Cefuroxime, Lagevrio [molnupiravir], Amoxicillin, Cephalexin, Promethazine hcl, Sertraline, and Sulfa antibiotics   History: Past Medical History:  Diagnosis Date   Anxiety    Chronic headaches    COVID 03/03/2020   Depression  Diverticulosis    GERD (gastroesophageal reflux disease)    Osteoporosis    Past Surgical History:  Procedure Laterality Date   ABDOMINAL HYSTERECTOMY  03/14/1989   partial   COLONOSCOPY     COLONOSCOPY WITH PROPOFOL N/A 12/14/2021   Procedure: COLONOSCOPY WITH PROPOFOL;  Surgeon: Toney Reil, MD;  Location: [facility] ENDOSCOPY;  Service: Gastroenterology;  Laterality: N/A;   TUBAL LIGATION     Family History  Problem Relation Age of Onset   Aneurysm Mother    Stroke Father    Hypertension Father    Lupus Sister    Osteoporosis Sister    Hypertension Brother     Heart attack Brother    Breast cancer Paternal Aunt    Diabetes Paternal Grandfather    Colon cancer Neg Hx    Social History   Socioeconomic History   Marital status: Married    Spouse name: Chrissie Noa   Number of children: 2   Years of education: high school   Highest education level: Not on file  Occupational History   Not on file  Tobacco Use   Smoking status: Never   Smokeless tobacco: Never  Vaping Use   Vaping status: Never Used  Substance and Sexual Activity   Alcohol use: No   Drug use: No   Sexual activity: Not Currently    Partners: Male  Other Topics Concern   Not on file  Social History Narrative   04/20/20   From: the area   Living: with son - Maurine Minister and husband is in long term care   Work: part-time 2 days a week, making window treatments      Family: Maurine Minister and Zella Ball - 2 grandchildren, and 1 adopted grandchildren       Enjoys: work is fun it is with friends      Exercise: walking, bending at work   Diet: tries to eat healthy      Safety   Seat belts: Yes    Guns: Yes  and secure   Safe in relationships: Yes             Social Determinants of Health   Financial Resource Strain: Low Risk  (11/21/2022)   Overall Financial Resource Strain (CARDIA)    Difficulty of Paying Living Expenses: Not hard at all  Food Insecurity: No Food Insecurity (11/21/2022)   Hunger Vital Sign    Worried About Running Out of Food in the Last Year: Never true    Ran Out of Food in the Last Year: Never true  Transportation Needs: No Transportation Needs (11/21/2022)   PRAPARE - Administrator, Civil Service (Medical): No    Lack of Transportation (Non-Medical): No  Physical Activity: Inactive (11/21/2022)   Exercise Vital Sign    Days of Exercise per Week: 0 days    Minutes of Exercise per Session: 0 min  Stress: No Stress Concern Present (11/21/2022)   [employer] of Occupational Health - Occupational Stress Questionnaire    Feeling of Stress : Not at  all  Social Connections: Moderately Isolated (11/21/2022)   Social Connection and Isolation Panel [NHANES]    Frequency of Communication with Friends and Family: More than three times a week    Frequency of Social Gatherings with Friends and Family: More than three times a week    Attends Religious Services: Never    Database administrator or Organizations: No    Attends Banker Meetings: Never    Marital  Status: Married    Tobacco Counseling Counseling given: Not Answered   Clinical Intake:  Pre-visit preparation completed: Yes  Pain : No/denies pain     BMI - recorded: 19.07 Nutritional Status: BMI of 19-24  Normal Nutritional Risks: None Diabetes: No  How often do you need to have someone help you when you read instructions, pamphlets, or other written materials from your doctor or pharmacy?: 1 - Never  Interpreter Needed?: No  Information entered by :: C.Archibald Marchetta LPN   Activities of Daily Living    11/21/2022    8:28 AM  In your present state of health, do you have any difficulty performing the following activities:  Hearing? 0  Vision? 0  Difficulty concentrating or making decisions? 1  Comment forgets occasionally  Walking or climbing stairs? 0  Dressing or bathing? 0  Doing errands, shopping? 0  Preparing Food and eating ? N  Using the Toilet? N  In the past six months, have you accidently leaked urine? N  Do you have problems with loss of bowel control? N  Managing your Medications? N  Managing your Finances? N  Housekeeping or managing your Housekeeping? N    Patient Care Team: Mort Sawyers, FNP as PCP - General (Family Medicine) Romero Belling, MD (Inactive) as Consulting Physician (Endocrinology) Kathyrn Sheriff, [facility] (Inactive) as Pharmacist (Pharmacist)  Indicate any recent Medical Services you may have received from other than [facility] providers in the past year (date may be approximate).     Assessment:   This is a routine  wellness examination for [place].  Hearing/Vision screen Hearing Screening - Comments:: Denies hearing difficulties   Vision Screening - Comments:: Glasses - [facility] - UTD on eye exams   Goals Addressed             This Visit's Progress    Patient Stated       Feel better, increase exercise.       Depression Screen    11/21/2022    8:21 AM 08/30/2022   11:49 AM 11/16/2021   10:46 AM 11/12/2020    3:15 PM 11/08/2019    3:38 PM 11/02/2018    9:04 AM 07/14/2017    9:26 AM  PHQ 2/9 Scores  PHQ - 2 Score 0 0 0 1 2 0 0  PHQ- 9 Score  2 0 2 2 1      Fall Risk    11/21/2022    8:28 AM 11/16/2021    8:54 AM 11/12/2020    1:57 PM 11/11/2019    9:39 AM 11/08/2019    3:38 PM  Fall Risk   Falls in the past year? 0 0 0 0 0  Number falls in past yr: 0 0 0  0  Injury with Fall? 0 0   0  Risk for fall due to : No Fall Risks No Fall Risks   Medication side effect  Follow up Falls prevention discussed;Falls evaluation completed Falls prevention discussed   Falls evaluation completed;Falls prevention discussed    MEDICARE RISK AT HOME: Medicare Risk at Home Any stairs in or around the home?: No If so, are there any without handrails?: No Home free of loose throw rugs in walkways, pet beds, electrical cords, etc?: Yes Adequate lighting in your home to reduce risk of falls?: Yes Life alert?: No Use of a cane, walker or w/c?: No Grab bars in the bathroom?: Yes Shower chair or bench in shower?: Yes Elevated toilet seat or a handicapped toilet?: Yes  TIMED UP AND GO:  Was the test performed?  No    Cognitive Function:    11/08/2019    3:43 PM 11/02/2018    9:08 AM  MMSE - Mini Mental State Exam  Not completed: Refused   Orientation to time  4  Orientation to Place  5  Registration  3  Attention/ Calculation  5  Recall  3  Language- name 2 objects  0  Language- repeat  1  Language- follow 3 step command  0  Language- read & follow direction  0  Write a sentence  0  Copy design   0  Total score  21        11/21/2022    8:30 AM 11/16/2021    8:56 AM  6CIT Screen  What Year? 0 points 0 points  What month? 0 points 0 points  What time? 0 points 0 points  Count back from 20 0 points 0 points  Months in reverse 0 points 0 points  Repeat phrase 0 points 0 points  Total Score 0 points 0 points    Immunizations Immunization History  Administered Date(s) Administered   Fluad Quad(high Dose 65+) 11/09/2018, 12/14/2019, 11/12/2020, 11/23/2021   Fluad Trivalent(High Dose 65+) 11/17/2022   Influenza Split 01/31/2011, 12/15/2011   Influenza Whole 12/10/2007, 03/10/2009, 12/08/2009   Influenza,inj,Quad PF,6+ Mos 12/26/2012, 11/07/2013, 02/13/2015, 11/13/2015, 12/14/2017   Influenza-Unspecified 12/19/2016   PFIZER(Purple Top)SARS-COV-2 Vaccination 04/27/2019, 05/18/2019, 08/21/2020   Pneumococcal Conjugate-13 05/29/2014   Pneumococcal Polysaccharide-23 04/25/2011   Td 02/08/2006   Tdap 07/01/2016   Zoster Recombinant(Shingrix) 11/15/2019, 04/10/2020   Zoster, Live 07/30/2009    TDAP status: Up to date  Flu Vaccine status: Up to date  Pneumococcal vaccine status: Up to date  Covid-19 vaccine status: Information provided on how to obtain vaccines.   Qualifies for Shingles Vaccine? Yes   Zostavax completed Yes   Shingrix Completed?: Yes  Screening Tests Health Maintenance  Topic Date Due   COVID-19 Vaccine (4 - 2023-24 season) 11/13/2022   MAMMOGRAM  09/12/2023   Medicare Annual Wellness (AWV)  11/21/2023   Colonoscopy  12/14/2024   DTaP/Tdap/Td (3 - Td or Tdap) 07/02/2026   Pneumonia Vaccine 45+ Years old  Completed   INFLUENZA VACCINE  Completed   DEXA SCAN  Completed   Hepatitis C Screening  Completed   Zoster Vaccines- Shingrix  Completed   HPV VACCINES  Aged Out    Health Maintenance  Health Maintenance Due  Topic Date Due   COVID-19 Vaccine (4 - 2023-24 season) 11/13/2022    Colorectal cancer screening: No longer required.   Mammogram  status: Completed 09/12/22. Repeat every year7/01/24  Bone Density status: Ordered 11/21/22. Pt provided with contact info and advised to call to schedule appt.  Lung Cancer Screening: (Low Dose CT Chest recommended if Age 51-80 years, 20 pack-year currently smoking OR have quit w/in 15years.) does not qualify.   Lung Cancer Screening Referral:    Additional Screening:  Hepatitis C Screening: does qualify; Completed 11/12/20  Vision Screening: Recommended annual ophthalmology exams for early detection of glaucoma and other disorders of the eye. Is the patient up to date with their annual eye exam?  Yes  Who is the provider or what is the name of the office in which the patient attends annual eye exams? [facility] If pt is not established with a provider, would they like to be referred to a provider to establish care? Yes .   Dental Screening: Recommended annual  dental exams for proper oral hygiene  Diabetic Foot Exam:   Community Resource Referral / Chronic Care Management: CRR required this visit?  No   CCM required this visit?  No     Plan:     I have personally reviewed and noted the following in the patient's chart:   Medical and social history Use of alcohol, tobacco or illicit drugs  Current medications and supplements including opioid prescriptions. Patient is not currently taking opioid prescriptions. Functional ability and status Nutritional status Physical activity Advanced directives List of other physicians Hospitalizations, surgeries, and ER visits in previous 12 months Vitals Screenings to include cognitive, depression, and falls Referrals and appointments  In addition, I have reviewed and discussed with patient certain preventive protocols, quality metrics, and best practice recommendations. A written personalized care plan for preventive services as well as general preventive health recommendations were provided to patient.     Maryan Puls,  LPN   10/18/5782   After Visit Summary: (MyChart) Due to this being a telephonic visit, the after visit summary with patients personalized plan was offered to patient via MyChart   Nurse Notes: none

## 2022-11-25 ENCOUNTER — Ambulatory Visit (INDEPENDENT_AMBULATORY_CARE_PROVIDER_SITE_OTHER): Payer: Medicare HMO | Admitting: Internal Medicine

## 2022-11-25 ENCOUNTER — Encounter: Payer: Self-pay | Admitting: Internal Medicine

## 2022-11-25 VITALS — BP 130/84 | HR 76 | Temp 97.2°F | Ht 61.0 in | Wt 102.0 lb

## 2022-11-25 DIAGNOSIS — R140 Abdominal distension (gaseous): Secondary | ICD-10-CM | POA: Diagnosis not present

## 2022-11-25 NOTE — Progress Notes (Signed)
Subjective:    Patient ID: Kristen Caldwell, female    DOB: 1946-02-19, 77 y.o.   MRN: [id number]  HPI Here due to abdominal cramping  This goes back a long time--for months Stays bloated all the time--not really painful Gets gas--some burping but mostly rectal  Bowels never moved daily---takes fiber bar Usually every 2-3 days---stable No blood No N/V Appetite never big---no change in eating Weight is stable  No sugarless candies, gums, drinks Decaf tea Long time on fish oil  Current Outpatient Medications on File Prior to Visit  Medication Sig Dispense Refill   amitriptyline (ELAVIL) 25 MG tablet Take 1 tablet (25 mg total) by mouth at bedtime. 90 tablet 3   Calcium Carbonate-Vit D-Min (CALTRATE 600+D PLUS) 600-400 MG-UNIT per tablet Take 2 tablets by mouth daily.     clorazepate (TRANXENE) 3.75 MG tablet Take 1 tablet by mouth once daily 90 tablet 0   Cranberry (AZO CRANBERRY GUMMIES) 250 MG CHEW Chew 250 mg by mouth daily.     meclizine (ANTIVERT) 12.5 MG tablet Take 1 tablet (12.5 mg total) by mouth 3 (three) times daily as needed for dizziness. 30 tablet 0   Multiple Vitamins-Minerals (CENTRUM SILVER) CHEW Chew 1 tablet by mouth daily.     Omega-3 Fatty Acids (FISH OIL PO) Take 1 capsule by mouth 2 (two) times daily.     omeprazole (PRILOSEC) 20 MG capsule Take 1 capsule by mouth once daily 90 capsule 0   ondansetron (ZOFRAN) 4 MG tablet Take 1 tablet (4 mg total) by mouth every 8 (eight) hours as needed for nausea or vomiting. 20 tablet 0   topiramate (TOPAMAX) 25 MG tablet TAKE 1 TABLET(25 MG) BY MOUTH TWICE DAILY 180 tablet 2   TURMERIC PO Take by mouth.     vitamin B-12 (CYANOCOBALAMIN) 1000 MCG tablet Take 1,000 mcg by mouth daily.     estradiol (ESTRACE) 0.5 MG tablet Take 0.5 tablets (0.25 mg total) by mouth daily for 6 days, THEN 0.5 tablets (0.25 mg total) daily for 5 days, THEN 0.5 tablets (0.25 mg total) daily for 4 days, THEN 0.5 tablets (0.25 mg  total) daily for 3 days, THEN 0.5 tablets (0.25 mg total) daily for 2 days, THEN 0.5 tablets (0.25 mg total) daily for 1 day. 11 tablet 0   No current facility-administered medications on file prior to visit.    Allergies  Allergen Reactions   Cefuroxime Nausea Only   Lagevrio [Molnupiravir] Nausea Only    Rash    Amoxicillin Nausea Only    REACTION: NAUSEA   Cephalexin     REACTION: NAUSEA AND VOMITING   Promethazine Hcl Rash   Sertraline Diarrhea   Sulfa Antibiotics Nausea Only    Past Medical History:  Diagnosis Date   Anxiety    Chronic headaches    COVID 03/03/2020   Depression    Diverticulosis    GERD (gastroesophageal reflux disease)    Osteoporosis     Past Surgical History:  Procedure Laterality Date   ABDOMINAL HYSTERECTOMY  03/14/1989   partial   COLONOSCOPY     COLONOSCOPY WITH PROPOFOL N/A 12/14/2021   Procedure: COLONOSCOPY WITH PROPOFOL;  Surgeon: Toney Reil, MD;  Location: [facility] ENDOSCOPY;  Service: Gastroenterology;  Laterality: N/A;   TUBAL LIGATION      Family History  Problem Relation Age of Onset   Aneurysm Mother    Stroke Father    Hypertension Father    Lupus Sister  Osteoporosis Sister    Hypertension Brother    Heart attack Brother    Breast cancer Paternal Aunt    Diabetes Paternal Grandfather    Colon cancer Neg Hx     Social History   Socioeconomic History   Marital status: Married    Spouse name: Chrissie Noa   Number of children: 2   Years of education: high school   Highest education level: Not on file  Occupational History   Not on file  Tobacco Use   Smoking status: Never   Smokeless tobacco: Never  Vaping Use   Vaping status: Never Used  Substance and Sexual Activity   Alcohol use: No   Drug use: No   Sexual activity: Not Currently    Partners: Male  Other Topics Concern   Not on file  Social History Narrative   04/20/20   From: the area   Living: with son - Maurine Minister and husband is in long term care    Work: part-time 2 days a week, making window treatments      Family: Maurine Minister and Zella Ball - 2 grandchildren, and 1 adopted grandchildren       Enjoys: work is fun it is with friends      Exercise: walking, bending at work   Diet: tries to eat healthy      Safety   Seat belts: Yes    Guns: Yes  and secure   Safe in relationships: Yes             Social Determinants of Health   Financial Resource Strain: Low Risk  (11/21/2022)   Overall Financial Resource Strain (CARDIA)    Difficulty of Paying Living Expenses: Not hard at all  Food Insecurity: No Food Insecurity (11/21/2022)   Hunger Vital Sign    Worried About Running Out of Food in the Last Year: Never true    Ran Out of Food in the Last Year: Never true  Transportation Needs: No Transportation Needs (11/21/2022)   PRAPARE - Administrator, Civil Service (Medical): No    Lack of Transportation (Non-Medical): No  Physical Activity: Inactive (11/21/2022)   Exercise Vital Sign    Days of Exercise per Week: 0 days    Minutes of Exercise per Session: 0 min  Stress: No Stress Concern Present (11/21/2022)   [employer] of Occupational Health - Occupational Stress Questionnaire    Feeling of Stress : Not at all  Social Connections: Moderately Isolated (11/21/2022)   Social Connection and Isolation Panel [NHANES]    Frequency of Communication with Friends and Family: More than three times a week    Frequency of Social Gatherings with Friends and Family: More than three times a week    Attends Religious Services: Never    Database administrator or Organizations: No    Attends Banker Meetings: Never    Marital Status: Married  Catering manager Violence: Not At Risk (11/21/2022)   Humiliation, Afraid, Rape, and Kick questionnaire    Fear of Current or Ex-Partner: No    Emotionally Abused: No    Physically Abused: No    Sexually Abused: No   Review of Systems Had hysterectomy--ovaries still now Has some  dizziness--hasn't needed meclizine lately. Uses ondansetron prn (but is constipating)--mostly trouble with bending/lifting    Objective:   Physical Exam Constitutional:      Appearance: Normal appearance.  Cardiovascular:     Rate and Rhythm: Normal rate and regular rhythm.  Heart sounds: No murmur heard.    No gallop.  Pulmonary:     Effort: Pulmonary effort is normal.     Breath sounds: Normal breath sounds. No wheezing or rales.  Abdominal:     General: There is no distension.     Palpations: Abdomen is soft.     Tenderness: There is no abdominal tenderness. There is no guarding or rebound.  Musculoskeletal:     Cervical back: Neck supple.     Right lower leg: No edema.     Left lower leg: No edema.  Lymphadenopathy:     Cervical: No cervical adenopathy.  Neurological:     Mental Status: She is alert.            Assessment & Plan:

## 2022-11-25 NOTE — Assessment & Plan Note (Signed)
No worrisome features Discussed the low possibility of ovarian lesion Gave low FODMAP info and asked her to stop the omega-3 and propel If ongoing symptoms, would probably just check pelvic ultrasound Consider GI evaluation

## 2022-12-21 ENCOUNTER — Other Ambulatory Visit: Payer: Self-pay

## 2022-12-21 ENCOUNTER — Ambulatory Visit (INDEPENDENT_AMBULATORY_CARE_PROVIDER_SITE_OTHER)
Admission: RE | Admit: 2022-12-21 | Discharge: 2022-12-21 | Disposition: A | Discharge status: Home / Self Care | Payer: Medicare HMO | Source: Ambulatory Visit | Attending: Family | Admitting: Family

## 2022-12-21 ENCOUNTER — Ambulatory Visit (INDEPENDENT_AMBULATORY_CARE_PROVIDER_SITE_OTHER): Payer: Medicare HMO | Admitting: Family

## 2022-12-21 VITALS — BP 102/64 | HR 68 | Temp 97.7°F | Ht 61.0 in | Wt 100.8 lb

## 2022-12-21 DIAGNOSIS — R1030 Lower abdominal pain, unspecified: Secondary | ICD-10-CM

## 2022-12-21 DIAGNOSIS — K5900 Constipation, unspecified: Secondary | ICD-10-CM | POA: Diagnosis not present

## 2022-12-21 DIAGNOSIS — N952 Postmenopausal atrophic vaginitis: Secondary | ICD-10-CM | POA: Diagnosis not present

## 2022-12-21 DIAGNOSIS — K5909 Other constipation: Secondary | ICD-10-CM | POA: Diagnosis not present

## 2022-12-21 DIAGNOSIS — T781XXA Other adverse food reactions, not elsewhere classified, initial encounter: Secondary | ICD-10-CM

## 2022-12-21 DIAGNOSIS — R140 Abdominal distension (gaseous): Secondary | ICD-10-CM | POA: Diagnosis not present

## 2022-12-21 NOTE — Assessment & Plan Note (Signed)
Patient again symptomatic since stopping the estradiol.  We can consider reintroduction of this pending results of the workup for the abdominal concerns.

## 2022-12-21 NOTE — Progress Notes (Signed)
Established Patient Office Visit  Subjective:      CC:  Chief Complaint  Patient presents with   Medical Management of Chronic Issues    Still having issues with abdominal bloating.    HPI: Kristen Caldwell is a 77 y.o. female presenting on 12/21/2022 for Medical Management of Chronic Issues (Still having issues with abdominal bloating.) . Constipation: was seen 11/25/22 for abdominal cramping. Feels bloated all of the time with some gas here and there. She has tried a FOD map diet and it is helpful for her, she has been able to identify some triggers such as eggs and dark chocolate. Constipation is chronic for her, takes a fiber bar typically every 2-3 days.  She has used miralax with some relief of constipation.  Gas has improved since she has changed her eating. Denies heartburn  Colonoscopy 12/14/21, polyps was advised to repeat x 3 years.   Stopped HRT about one month ago and now c/o hot flashes, will wake up hot at night time as well.    Social history:  Relevant past medical, surgical, family and social history reviewed and updated as indicated. Interim medical history since our last visit reviewed.  Allergies and medications reviewed and updated.  DATA REVIEWED: CHART IN EPIC     ROS: Negative unless specifically indicated above in HPI.    Current Outpatient Medications:    amitriptyline (ELAVIL) 25 MG tablet, Take 1 tablet (25 mg total) by mouth at bedtime., Disp: 90 tablet, Rfl: 3   Calcium Carbonate-Vit D-Min (CALTRATE 600+D PLUS) 600-400 MG-UNIT per tablet, Take 2 tablets by mouth daily., Disp: , Rfl:    clorazepate (TRANXENE) 3.75 MG tablet, Take 1 tablet by mouth once daily, Disp: 90 tablet, Rfl: 0   Cranberry (AZO CRANBERRY GUMMIES) 250 MG CHEW, Chew 250 mg by mouth daily., Disp: , Rfl:    meclizine (ANTIVERT) 12.5 MG tablet, Take 1 tablet (12.5 mg total) by mouth 3 (three) times daily as needed for dizziness., Disp: 30 tablet, Rfl: 0   Multiple  Vitamins-Minerals (CENTRUM SILVER) CHEW, Chew 1 tablet by mouth daily., Disp: , Rfl:    Omega-3 Fatty Acids (FISH OIL PO), Take 1 capsule by mouth 2 (two) times daily., Disp: , Rfl:    omeprazole (PRILOSEC) 20 MG capsule, Take 1 capsule by mouth once daily, Disp: 90 capsule, Rfl: 0   ondansetron (ZOFRAN) 4 MG tablet, Take 1 tablet (4 mg total) by mouth every 8 (eight) hours as needed for nausea or vomiting., Disp: 20 tablet, Rfl: 0   topiramate (TOPAMAX) 25 MG tablet, TAKE 1 TABLET(25 MG) BY MOUTH TWICE DAILY, Disp: 180 tablet, Rfl: 2   TURMERIC PO, Take by mouth., Disp: , Rfl:    vitamin B-12 (CYANOCOBALAMIN) 1000 MCG tablet, Take 1,000 mcg by mouth daily., Disp: , Rfl:       Objective:    BP 102/64 (BP Location: Left Arm, Patient Position: Sitting, Cuff Size: Normal)   Pulse 68   Temp 97.7 F (36.5 C) (Oral)   Ht 5\' 1"  (1.549 m)   Wt 100 lb 12.8 oz (45.7 kg)   SpO2 98%   BMI 19.05 kg/m   Wt Readings from Last 3 Encounters:  12/21/22 100 lb 12.8 oz (45.7 kg)  11/25/22 102 lb (46.3 kg)  11/21/22 101 lb (45.8 kg)    Physical Exam Vitals reviewed.  Constitutional:      General: She is not in acute distress.    Appearance: Normal appearance. She is  normal weight. She is not ill-appearing, toxic-appearing or diaphoretic.  HENT:     Head: Normocephalic.  Cardiovascular:     Rate and Rhythm: Normal rate.  Pulmonary:     Effort: Pulmonary effort is normal.  Abdominal:     General: Bowel sounds are normal. There is distension.     Palpations: Abdomen is soft.     Tenderness: There is abdominal tenderness in the right lower quadrant and left lower quadrant.     Comments: Llq pain greater than rlq  Musculoskeletal:        General: Normal range of motion.  Neurological:     General: No focal deficit present.     Mental Status: She is alert and oriented to person, place, and time. Mental status is at baseline.  Psychiatric:        Mood and Affect: Mood normal.        Behavior:  Behavior normal.        Thought Content: Thought content normal.        Judgment: Judgment normal.     Wt Readings from Last 3 Encounters:  12/21/22 100 lb 12.8 oz (45.7 kg)  11/25/22 102 lb (46.3 kg)  11/21/22 101 lb (45.8 kg)        Assessment & Plan:  Gastrointestinal food sensitivity -     Food Allergy Profile w/ Reflexes -     Alpha-Gal Panel  Abdominal distension Assessment & Plan: Going to start with KUB pending results.  Suspect constipation will see if any obstruction although less likely as and/or extreme pain.  If no concerns on KUB may also consider a pelvic ultrasound to rule out other considerations such as ovarian cyst, fibroids.  Continue to work on avoiding food related triggers that might exacerbate symptoms.  Order placed for lab calprotectin and celiac panel as well as alpha gal pending results.  Low FODMAP diet advised patient to continue along with this.  Food allergy testing also ordered and pending result  Orders: -     Food Allergy Profile w/ Reflexes -     Alpha-Gal Panel  Lower abdominal pain -     Celiac Pnl 2 rflx Endomysial Ab Ttr -     Alpha-Gal Panel -     DG Abd 1 View; Future -     CALPROTECTIN; Future  Chronic constipation -     Food Allergy Profile w/ Reflexes -     Celiac Pnl 2 rflx Endomysial Ab Ttr -     Alpha-Gal Panel -     DG Abd 1 View; Future -     CALPROTECTIN; Future  Vaginal atrophy Assessment & Plan: Patient again symptomatic since stopping the estradiol.  We can consider reintroduction of this pending results of the workup for the abdominal concerns.      Return in about 2 weeks (around 01/04/2023) for follow up constipation .  Mort Sawyers, MSN, APRN, FNP-C [facility]

## 2022-12-21 NOTE — Assessment & Plan Note (Signed)
Going to start with KUB pending results.  Suspect constipation will see if any obstruction although less likely as and/or extreme pain.  If no concerns on KUB may also consider a pelvic ultrasound to rule out other considerations such as ovarian cyst, fibroids.  Continue to work on avoiding food related triggers that might exacerbate symptoms.  Order placed for lab calprotectin and celiac panel as well as alpha gal pending results.  Low FODMAP diet advised patient to continue along with this.  Food allergy testing also ordered and pending result

## 2022-12-23 ENCOUNTER — Other Ambulatory Visit: Payer: Self-pay | Admitting: Family

## 2022-12-23 DIAGNOSIS — K219 Gastro-esophageal reflux disease without esophagitis: Secondary | ICD-10-CM

## 2022-12-28 LAB — FOOD ALLERGY PROFILE W/ REFLEXES
Allergen, Salmon, f41: <0.1 kU/L
Almonds: <0.1 kU/L
CLASS: 0
CLASS: 0
CLASS: 0
CLASS: 0
CLASS: 0
CLASS: 0
CLASS: 0
CLASS: 0
CLASS: 0
CLASS: 0
CLASS: 0
Cashew IgE: <0.1 kU/L
Class: 0
Class: 0
Class: 0
Class: 0
Egg White IgE: <0.1 kU/L
Fish Cod: <0.1 kU/L
Hazelnut: <0.1 kU/L
Milk IgE: <0.1 kU/L
Peanut IgE: <0.1 kU/L
Scallop IgE: <0.1 kU/L
Sesame Seed f10: <0.1 kU/L
Shrimp IgE: <0.1 kU/L
Soybean IgE: <0.1 kU/L
Tuna IgE: <0.1 kU/L
Walnut: <0.1 kU/L
Wheat IgE: <0.1 kU/L

## 2022-12-28 LAB — CALPROTECTIN: Calprotectin: 153 ug/g — ABNORMAL HIGH

## 2022-12-28 LAB — ALPHA-GAL PANEL
Allergen, Mutton, f88: <0.1 kU/L
Allergen, Pork, f26: <0.1 kU/L
Beef: <0.1 kU/L
CLASS: 0
CLASS: 0
Class: 0
GALACTOSE-ALPHA-1,3-GALACTOSE IGE*: <0.1 kU/L (ref <0.10)

## 2022-12-28 LAB — CELIAC PNL 2 RFLX ENDOMYSIAL AB TTR
(tTG) Ab, IgA: <1 U/mL
(tTG) Ab, IgG: <1 U/mL
Endomysial Ab IgA: NEGATIVE
Gliadin IgA: 1 U/mL
Gliadin IgG: <1 U/mL
Immunoglobulin A: 181 mg/dL (ref 70–320)

## 2022-12-28 LAB — INTERPRETATION:

## 2022-12-29 NOTE — Progress Notes (Signed)
Calprotectin is a bit elevated above the normal range.  Is patiently actively having any abdominal pain and/or symptoms?  Sometimes an elevated calprotectin can indicate ulcerative colitis and/or Crohn's disease.  I think at this time it is reasonable to suggest a referral to gastroenterology to evaluate further.  Does she already have a gastroenterologist or does she need a referral to 1?

## 2022-12-30 ENCOUNTER — Telehealth: Payer: Self-pay | Admitting: Family

## 2022-12-30 NOTE — Telephone Encounter (Signed)
Pt called in and stated that she would like a call back regarding her referral she would like it to be [facility] due to her ins. She would like to stay in [facility] network.

## 2022-12-30 NOTE — Telephone Encounter (Signed)
I have made a note of this information on the pt's result note letting Tabitha know. Pt is aware.

## 2023-01-02 ENCOUNTER — Telehealth: Payer: Self-pay

## 2023-01-02 ENCOUNTER — Telehealth: Payer: Self-pay | Admitting: Family

## 2023-01-02 ENCOUNTER — Other Ambulatory Visit: Payer: Self-pay | Admitting: Family

## 2023-01-02 DIAGNOSIS — R195 Other fecal abnormalities: Secondary | ICD-10-CM

## 2023-01-02 DIAGNOSIS — K5909 Other constipation: Secondary | ICD-10-CM

## 2023-01-02 DIAGNOSIS — R140 Abdominal distension (gaseous): Secondary | ICD-10-CM

## 2023-01-02 NOTE — Telephone Encounter (Signed)
Patient called the [facility] office where her referral was sent to make an appointment.She said that they told her that she have to go to the place here she had her colonoscopy done at which she said was [facility].She called to make an appointment there and they told her that she needs a referral sent to them ,[facility].

## 2023-01-02 NOTE — Telephone Encounter (Signed)
Patient called in to get the name of her provider at this practice. She stated that [facility] informed her she will have to come to our practice to be seen because her procedure was done here. I did inform her that she procedure was done on 12/14/2021 with Dr. Allegra Lai and she will have to call her PCP to send a referral for [place] to schedule her for an office visit. She was never seen in the office.

## 2023-01-03 NOTE — Telephone Encounter (Signed)
I have placed an updated referral to [facility]. Thanks!

## 2023-01-03 NOTE — Telephone Encounter (Signed)
Thanks

## 2023-01-03 NOTE — Telephone Encounter (Signed)
Can you guys please look into this?  I have never heard that once you had a colonoscopy with someone you had to have them as your GI office, however, regardless, it says to change referral to [facility] however isn't the referral I placed for [facility]?   Referred to: [facility]   Sorry just a bit confused, and just need some clarification. Thanks!

## 2023-01-03 NOTE — Addendum Note (Signed)
Addended by: Mort Sawyers on: 01/03/2023 02:47 PM   Modules accepted: Orders

## 2023-01-04 ENCOUNTER — Other Ambulatory Visit: Payer: Self-pay | Admitting: Family

## 2023-01-04 DIAGNOSIS — G8929 Other chronic pain: Secondary | ICD-10-CM

## 2023-01-04 NOTE — Telephone Encounter (Signed)
The patient called in to confirm her appointment time. She wanted to know will she be able to make it in time to see her eye doctor tomorrow at 10:45 am at [facility]. I told her I'm not sure but it might be better if she come early because sometimes the provider might she her early.

## 2023-01-04 NOTE — Telephone Encounter (Signed)
Left a msg with the office regarding this urgent referral. The dept telephone does not give an option to speak with a person. I called the PCP office back and spoke with Gypsy Balsam. I spoke to Erin since Dr. Allegra Lai did her procedure. Per Morrie Sheldon she can see Inetta Fermo on Monday. Inetta Fermo is the only one with and urgent appointment between her and Dr. Allegra Lai. The patient having Abdominal distension, Elevated fecal calprotectin, Chronic constipation.

## 2023-01-08 NOTE — Progress Notes (Unsigned)
Kristen Amy, PA-C [address]  [address]  [place], [place] [address]  Main: [phone]  Fax: [phone]   Gastroenterology Consultation  Referring Provider:     Mort Sawyers, FNP Primary Care Physician:  Kristen Sawyers, FNP Primary Gastroenterologist:  Kristen Amy, PA-C / Dr. Lannette Caldwell   Reason for Consultation:     Abdominal pain, bloating, constipation, Elevated Fecal calprotectin        HPI:   Kristen Caldwell is a 77 y.o. y/o female referred for consultation & management  by Kristen Sawyers, FNP.    History of chronic constipation and chronic bloating.  Has tried MiraLAX and fiber.  Also tried low FODMAP diet.  Colonoscopy 12/14/2021 by Dr. Allegra Caldwell showed 9 mm and 15 mm adenomatous polyps removed from transverse colon.  Good prep.  3-year repeat.  Lab 12/21/2022 alpha gal and food allergy panel labs were negative.  Celiac labs negative.  Fecal calprotectin was elevated at 153.    Abdominal x-ray 12/21/2022 showed moderate amount of stool in the colon consistent with constipation.  Patient states she is having a bowel movement every 3 or 4 days with small hard balls of stool.  She tried low FODMAP diet with little benefit.  She has abdominal swelling and bloating - generalized.  Chronic intermittent right lower quadrant pain which is getting worse.  She is thin, however she denies weight loss.  She states her weight is stable.  She denies rectal bleeding.  She started OTC probiotic for the past week which has helped a little.  She does not eat much.  She avoids milk and dairy.  Tries to avoid trigger foods such as ice cream.  Drinks Lactaid milk.  She tried MiraLAX for a few days which did not work and she discontinued.  Currently taking omeprazole 20 mg once daily with good control of acid reflux.  Denies upper GI symptoms.  Past Medical History:  Diagnosis Date   Anxiety    Chronic headaches    COVID 03/03/2020   Depression    Diverticulosis    GERD  (gastroesophageal reflux disease)    Osteoporosis     Past Surgical History:  Procedure Laterality Date   ABDOMINAL HYSTERECTOMY  03/14/1989   partial   COLONOSCOPY     COLONOSCOPY WITH PROPOFOL N/A 12/14/2021   Procedure: COLONOSCOPY WITH PROPOFOL;  Surgeon: Kristen Reil, MD;  Location: [facility];  Service: Gastroenterology;  Laterality: N/A;   TUBAL LIGATION      Prior to Admission medications   Medication Sig Start Date End Date Taking? Authorizing Provider  amitriptyline (ELAVIL) 25 MG tablet Take 1 tablet (25 mg total) by mouth at bedtime. 05/31/22   Kristen Sawyers, FNP  Calcium Carbonate-Vit D-Min (CALTRATE 600+D PLUS) 600-400 MG-UNIT per tablet Take 2 tablets by mouth daily.    [provider]  clorazepate (TRANXENE) 3.75 MG tablet Take 1 tablet by mouth once daily 11/04/22   Kristen Sawyers, FNP  Cranberry (AZO CRANBERRY GUMMIES) 250 MG CHEW Chew 250 mg by mouth daily.    [provider]  meclizine (ANTIVERT) 12.5 MG tablet Take 1 tablet (12.5 mg total) by mouth 3 (three) times daily as needed for dizziness. 06/16/21   Kristen Dimitri, MD  Multiple Vitamins-Minerals (CENTRUM SILVER) CHEW Chew 1 tablet by mouth daily.    [provider]  Omega-3 Fatty Acids (FISH OIL PO) Take 1 capsule by mouth 2 (two) times daily.    [provider]  omeprazole (  PRILOSEC) 20 MG capsule Take 1 capsule by mouth once daily 12/23/22   Kristen Sawyers, FNP  ondansetron (ZOFRAN) 4 MG tablet Take 1 tablet (4 mg total) by mouth every 8 (eight) hours as needed for nausea or vomiting. 06/07/22   Kristen Sawyers, FNP  topiramate (TOPAMAX) 25 MG tablet Take 1 tablet by mouth twice daily 01/04/23   Kristen Sawyers, FNP  TURMERIC PO Take by mouth.    [provider]  vitamin B-12 (CYANOCOBALAMIN) 1000 MCG tablet Take 1,000 mcg by mouth daily.    [provider]    Family History  Problem Relation Age of Onset   Aneurysm Mother    Stroke Father     Hypertension Father    Lupus Sister    Osteoporosis Sister    Hypertension Brother    Heart attack Brother    Breast cancer Paternal Aunt    Diabetes Paternal Grandfather    Colon cancer Neg Hx      Social History   Tobacco Use   Smoking status: Never   Smokeless tobacco: Never  Vaping Use   Vaping status: Never Used  Substance Use Topics   Alcohol use: No   Drug use: No    Allergies as of 01/09/2023 - Review Complete 01/09/2023  Allergen Reaction Noted   Cefuroxime Nausea Only 07/19/2021   Lagevrio [molnupiravir] Nausea Only 06/06/2022   Amoxicillin Nausea Only 08/25/2006   Cephalexin  03/05/2007   Promethazine hcl Rash 11/01/2010   Sertraline Diarrhea 05/03/2019   Sulfa antibiotics Nausea Only 02/14/2022    Review of Systems:    All systems reviewed and negative except where noted in HPI.   Physical Exam:  BP 136/78   Pulse (!) 109   Temp 97.7 F (36.5 C)   Ht 5\' 1"  (1.549 m)   Wt 100 lb (45.4 kg)   BMI 18.89 kg/m  No LMP recorded. Patient has had a hysterectomy.  General:   Alert,  Well-developed, well-nourished, thin female, pleasant and cooperative in NAD Lungs:  Respirations even and unlabored.  Clear throughout to auscultation.   No wheezes, crackles, or rhonchi. No acute distress. Heart:  Regular rate and rhythm; no murmurs, clicks, rubs, or gallops. Abdomen:  Normal bowel sounds.  No bruits.  Soft, and non-distended without masses, hepatosplenomegaly or hernias noted.  Mild to moderate RLQ tenderness.  Mild LLQ tenderness.  No upper abdominal tenderness.  No guarding or rebound tenderness.    Neurologic:  Alert and oriented x3;  grossly normal neurologically. Psych:  Alert and cooperative. Normal mood and affect.  Imaging Studies: DG Abd 1 View  Result Date: 12/21/2022 CLINICAL DATA:  Lower abdominal pain.  Constipation. EXAM: ABDOMEN - 1 VIEW COMPARISON:  None Available. FINDINGS: Normal bowel gas pattern. Moderate amount of stool in the colon.  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 IMPRESSION: Moderate amount of stool in the colon. Electronically Signed   By: Kristen Caldwell M.D.   On: 12/21/2022 11:36    Assessment and Plan:   Kristen Caldwell is a 77 y.o. y/o female has been referred for:   1.  Generalized abdominal pain; Increasing RLQ pain  Labs CBC, CMP  Schedule abdominal pelvic CT enterography with contrast  2.  Elevated fecal calprotectin  Schedule abdominal pelvic CT enterography with contrast  Lab: CRP  3.  Chronic constipation and chronic bloating  Lab TSH  Start Linzess 72 mcg daily x 8 days and Linzess daily x 4 days, samples given.  Patient  will let me know which dose works best and then we can call in prescription.  Recommend high-fiber diet with fruits, vegetables, whole grains, 25 g fiber daily.  Drink 64 ounces of fluids daily.  4.  History of adenomatous colon polyps  3 repeat colonoscopy will be due 12/2024.  Follow up in 1 month with TG.    Kristen Amy, PA-C

## 2023-01-09 ENCOUNTER — Ambulatory Visit: Payer: Medicare HMO | Admitting: Physician Assistant

## 2023-01-09 ENCOUNTER — Encounter: Payer: Self-pay | Admitting: Physician Assistant

## 2023-01-09 VITALS — BP 136/78 | HR 109 | Temp 97.7°F | Ht 61.0 in | Wt 100.0 lb

## 2023-01-09 DIAGNOSIS — R1031 Right lower quadrant pain: Secondary | ICD-10-CM | POA: Diagnosis not present

## 2023-01-09 DIAGNOSIS — R195 Other fecal abnormalities: Secondary | ICD-10-CM | POA: Diagnosis not present

## 2023-01-09 DIAGNOSIS — R1084 Generalized abdominal pain: Secondary | ICD-10-CM

## 2023-01-09 DIAGNOSIS — K5909 Other constipation: Secondary | ICD-10-CM

## 2023-01-09 DIAGNOSIS — Z860100 Personal history of colon polyps, unspecified: Secondary | ICD-10-CM

## 2023-01-09 DIAGNOSIS — Z860101 Personal history of adenomatous and serrated colon polyps: Secondary | ICD-10-CM | POA: Diagnosis not present

## 2023-01-09 DIAGNOSIS — K5904 Chronic idiopathic constipation: Secondary | ICD-10-CM

## 2023-01-09 MED ORDER — LINACLOTIDE 72 MCG PO CAPS
72.0000 ug | ORAL_CAPSULE | Freq: Every day | ORAL | 2 refills | Status: DC
Start: 2023-01-09 — End: 2023-04-09

## 2023-01-09 NOTE — Patient Instructions (Addendum)
CT scheduled 01-12-23 @ 8:30 [facility].

## 2023-01-10 LAB — COMPREHENSIVE METABOLIC PANEL
ALT: 12 [IU]/L (ref 0–32)
AST: 19 [IU]/L (ref 0–40)
Albumin: 4.4 g/dL (ref 3.8–4.8)
Alkaline Phosphatase: 97 [IU]/L (ref 44–121)
BUN/Creatinine Ratio: 17 (ref 12–28)
BUN: 14 mg/dL (ref 8–27)
Bilirubin Total: 0.3 mg/dL (ref 0.0–1.2)
CO2: 24 mmol/L (ref 20–29)
Calcium: 10.1 mg/dL (ref 8.7–10.3)
Chloride: 99 mmol/L (ref 96–106)
Creatinine, Ser: 0.81 mg/dL (ref 0.57–1.00)
Globulin, Total: 3.3 g/dL (ref 1.5–4.5)
Glucose: 88 mg/dL (ref 70–99)
Potassium: 4.3 mmol/L (ref 3.5–5.2)
Sodium: 137 mmol/L (ref 134–144)
Total Protein: 7.7 g/dL (ref 6.0–8.5)
eGFR: 75 mL/min/{1.73_m2} (ref >59)

## 2023-01-10 LAB — CBC WITH DIFFERENTIAL/PLATELET
Basophils Absolute: 0 10*3/uL (ref 0.0–0.2)
Basos: 1 %
EOS (ABSOLUTE): 0.1 10*3/uL (ref 0.0–0.4)
Eos: 1 %
Hematocrit: 46 % (ref 34.0–46.6)
Hemoglobin: 15.2 g/dL (ref 11.1–15.9)
Immature Grans (Abs): 0 10*3/uL (ref 0.0–0.1)
Immature Granulocytes: 0 %
Lymphocytes Absolute: 1.5 10*3/uL (ref 0.7–3.1)
Lymphs: 18 %
MCH: 30.5 pg (ref 26.6–33.0)
MCHC: 33 g/dL (ref 31.5–35.7)
MCV: 92 fL (ref 79–97)
Monocytes Absolute: 0.7 10*3/uL (ref 0.1–0.9)
Monocytes: 8 %
Neutrophils Absolute: 5.9 10*3/uL (ref 1.4–7.0)
Neutrophils: 72 %
Platelets: 237 10*3/uL (ref 150–450)
RBC: 4.98 x10E6/uL (ref 3.77–5.28)
RDW: 12.1 % (ref 11.7–15.4)
WBC: 8.2 10*3/uL (ref 3.4–10.8)

## 2023-01-10 LAB — C-REACTIVE PROTEIN: CRP: <1 mg/L (ref 0–10)

## 2023-01-10 LAB — TSH: TSH: 3.18 u[IU]/mL (ref 0.450–4.500)

## 2023-01-11 ENCOUNTER — Telehealth: Payer: Self-pay

## 2023-01-11 ENCOUNTER — Telehealth: Payer: Self-pay | Admitting: Physician Assistant

## 2023-01-11 ENCOUNTER — Other Ambulatory Visit: Payer: Self-pay | Admitting: Physician Assistant

## 2023-01-11 DIAGNOSIS — K5904 Chronic idiopathic constipation: Secondary | ICD-10-CM

## 2023-01-11 MED ORDER — LACTULOSE 20 GM/30ML PO SOLN
30.0000 mL | Freq: Every day | ORAL | 5 refills | Status: DC
Start: 2023-01-11 — End: 2023-02-07

## 2023-01-11 NOTE — Telephone Encounter (Signed)
Spoke with patient and the Linzess made her sick- she prefers to use the Lactulose. I let patient know to check with pharmacy later today.

## 2023-01-11 NOTE — Telephone Encounter (Signed)
Patient notified.  Stop Linzess.  I sent prescription for lactulose to her pharmacy.  Celso Amy, PA-C

## 2023-01-11 NOTE — Telephone Encounter (Signed)
Patient called and left a voicemail she is requesting an  different medication because Linzess is making her nausea I called patient back to let him know we received his message. I sent the message to the nurse.

## 2023-01-11 NOTE — Progress Notes (Signed)
Patient states Linzess made her sick.  She prefers to take the lactulose for constipation.  I sent prescription to her pharmacy for lactulose.  Stop Linzess.

## 2023-01-12 ENCOUNTER — Ambulatory Visit
Admission: RE | Admit: 2023-01-12 | Discharge: 2023-01-12 | Disposition: A | Discharge status: Home / Self Care | Payer: Medicare HMO | Source: Ambulatory Visit | Attending: Physician Assistant | Admitting: Physician Assistant

## 2023-01-12 DIAGNOSIS — R195 Other fecal abnormalities: Secondary | ICD-10-CM | POA: Insufficient documentation

## 2023-01-12 DIAGNOSIS — R109 Unspecified abdominal pain: Secondary | ICD-10-CM | POA: Diagnosis not present

## 2023-01-12 DIAGNOSIS — K5710 Diverticulosis of small intestine without perforation or abscess without bleeding: Secondary | ICD-10-CM | POA: Diagnosis not present

## 2023-01-12 DIAGNOSIS — R1084 Generalized abdominal pain: Secondary | ICD-10-CM | POA: Insufficient documentation

## 2023-01-12 MED ORDER — IOHEXOL 300 MG/ML  SOLN
80.0000 mL | Freq: Once | INTRAMUSCULAR | Status: AC | PRN
  Administered 2023-01-12: 80 mL via INTRAVENOUS

## 2023-01-16 ENCOUNTER — Telehealth: Payer: Self-pay

## 2023-01-16 NOTE — Telephone Encounter (Signed)
Patient notified.  I let patient know we will contact her once we received the CT results.  Hi Kristen Caldwell, all of your labs are normal.  CBC, CMP, CRP, and TSH are normal.  White count, hemoglobin, kidney test, liver test, thyroid test are all normal.  No evidence of anemia, infection, or inflammation.  Nothing worrisome.  Continue with current plan. Celso Amy, PA-C

## 2023-01-22 IMAGING — DX DG ANKLE COMPLETE 3+V*R*
3 series · 3 of 3 positions shown · non-contrast
Comparison: None.

CLINICAL DATA: Trauma, pain

EXAM:
RIGHT ANKLE - COMPLETE 3+ VIEW

[ankle ap]
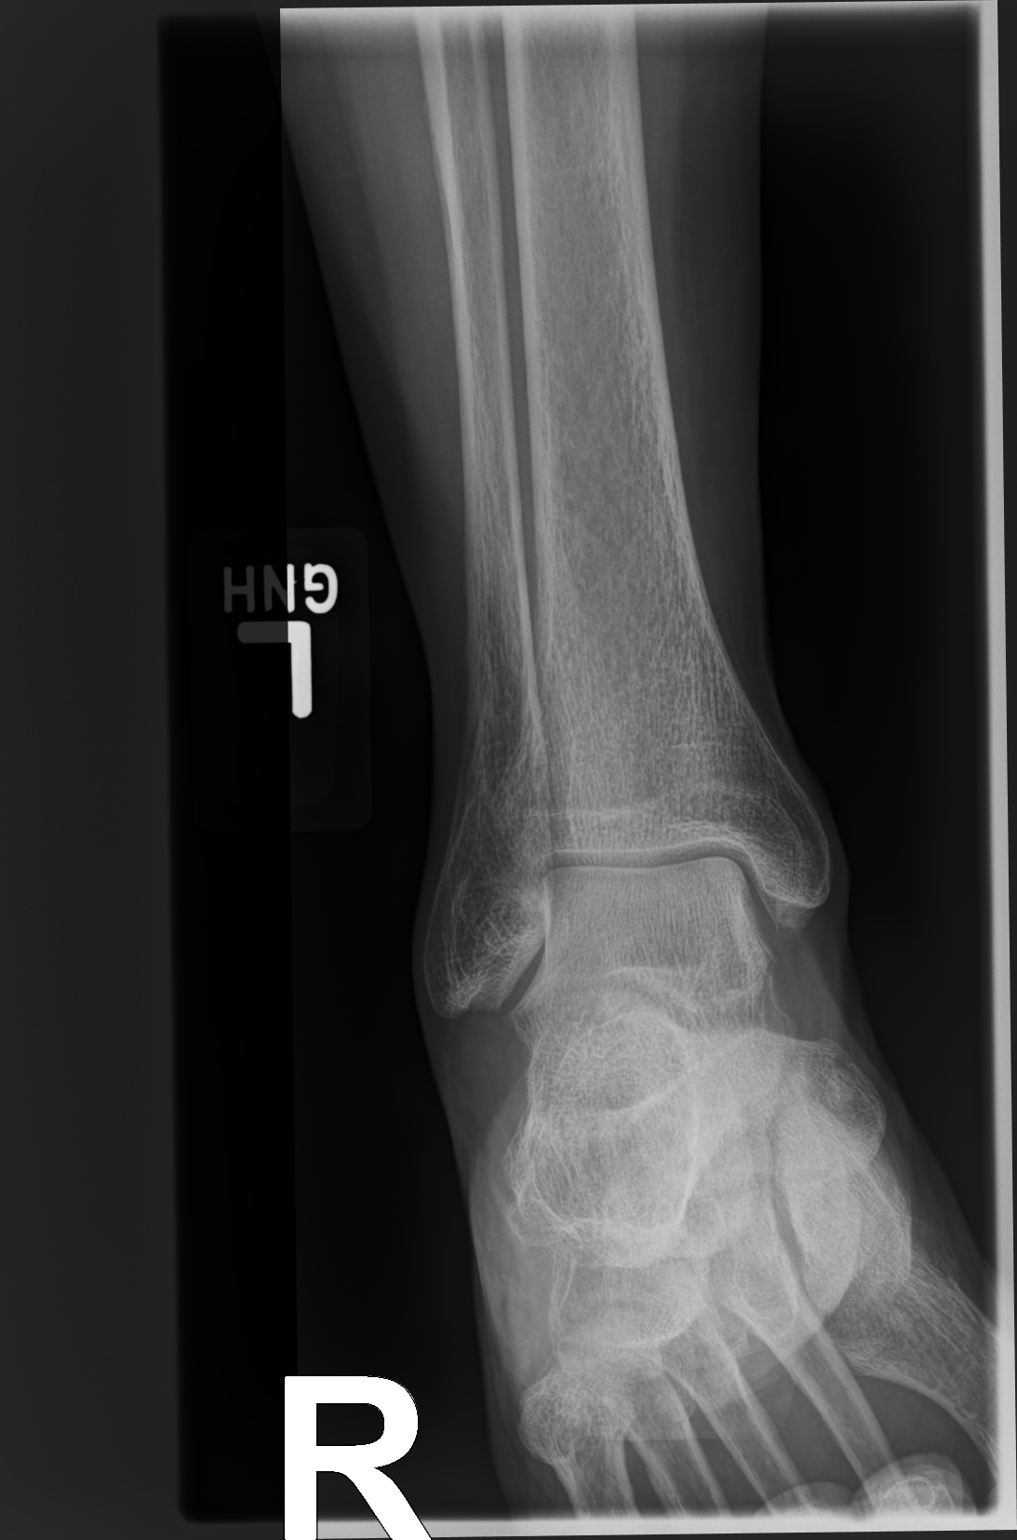

[ankle mlo]
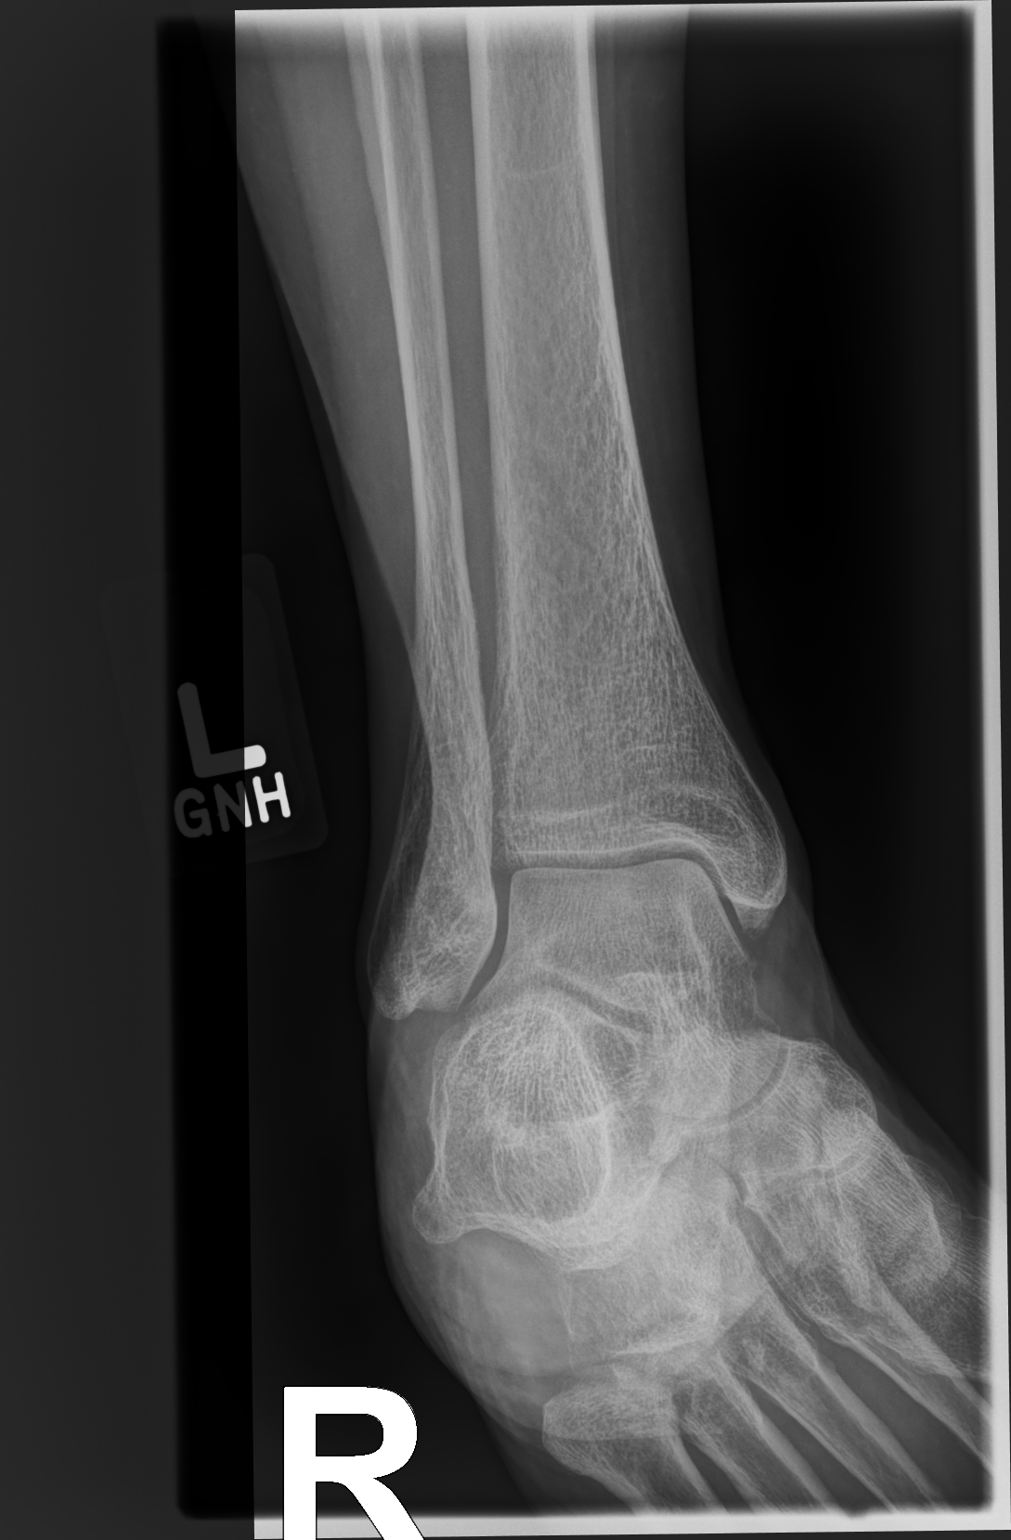

[ankle lat]
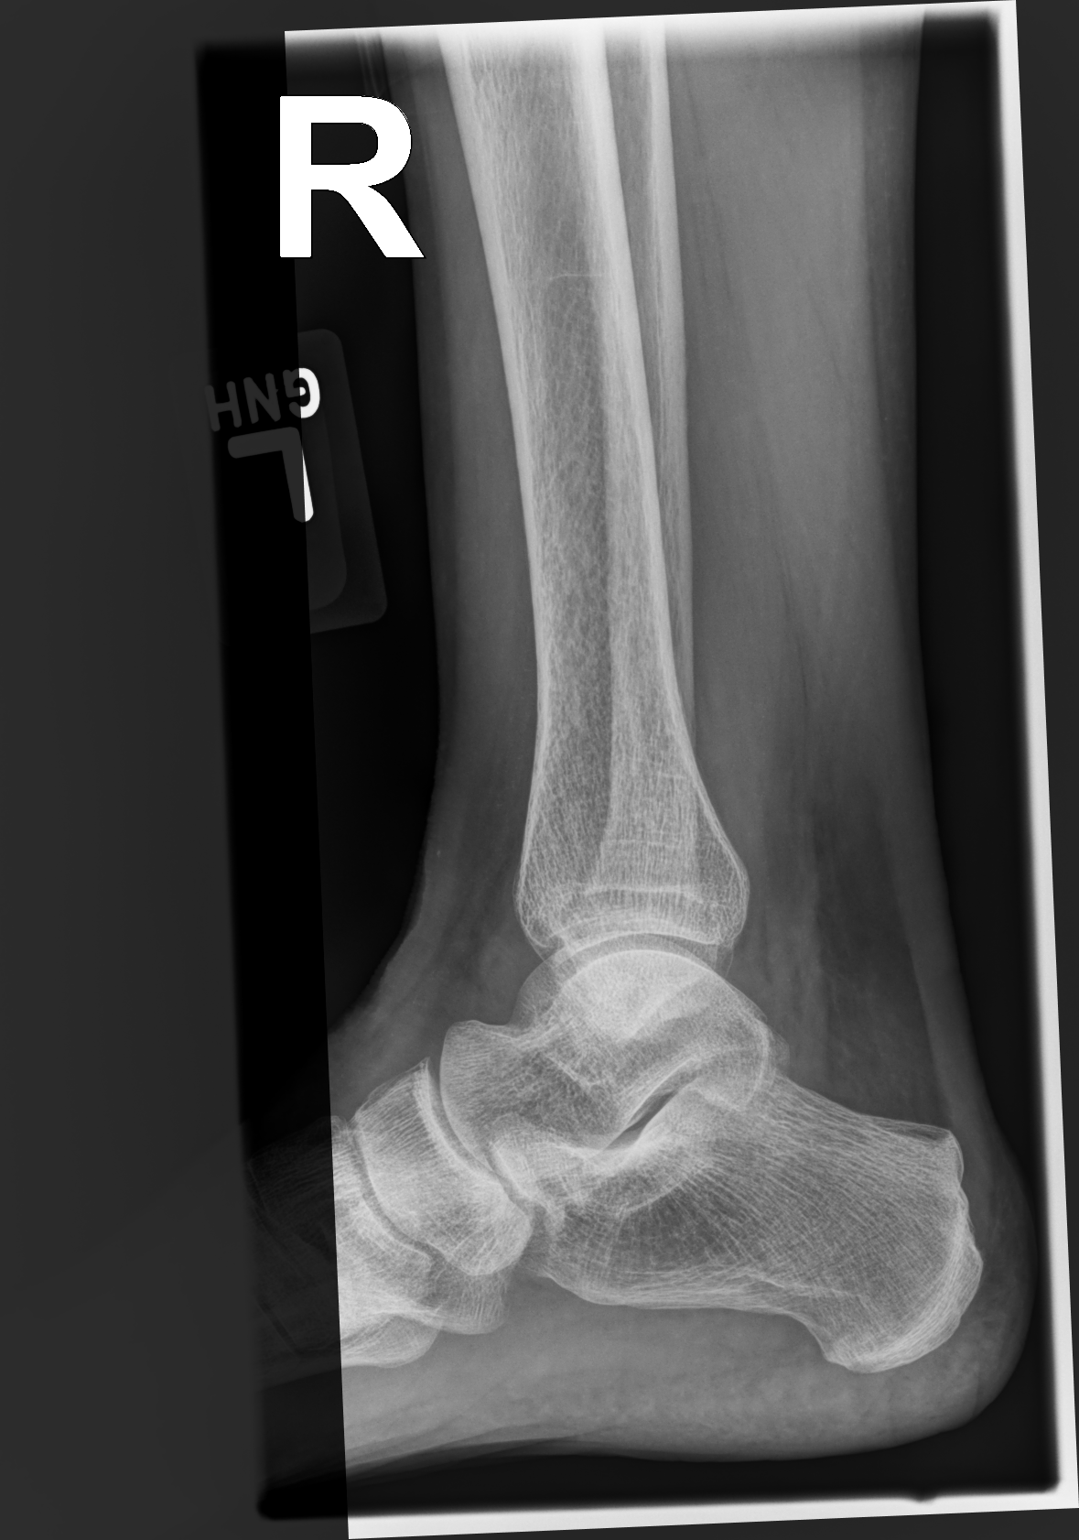

[3 of 3 positions shown; findings below may reference images not displayed]

FINDINGS: No recent fracture or dislocation is seen. Ankle mortise is
unremarkable. There are no opaque foreign bodies.
IMPRESSION: No radiographic abnormality is seen in the right ankle.

## 2023-01-23 ENCOUNTER — Telehealth: Payer: Self-pay

## 2023-01-23 NOTE — Telephone Encounter (Signed)
Patient is calling to find out her CT results that she had done on 01/12/2023 and it does not look like the results are back yet. Called the CT department and they said they are 20 to 22 days behind in reading but she said she will give [facility] a call and get the CT scan read for her.

## 2023-01-23 NOTE — Telephone Encounter (Signed)
Patient verbalized understanding of results and will restart the Miralax

## 2023-01-23 NOTE — Telephone Encounter (Signed)
Patient states she can take the Lactulose but she was several nausea and feel so bad. She states she tried to eat with it and take half the does and it still was the same result. She said she can not function with it. She said that she is still eating a bland diet. Is trying to eat more fiber, fruits and vegetables. She states she can take Miralax but does not know if you want her to take this. She states she also do not know if you want her to eat anything else but a bland diet since we do not know the CT results yet.

## 2023-01-23 NOTE — Telephone Encounter (Signed)
-----   Message from Celso Amy sent at 01/23/2023 10:00 AM EST ----- Call and notify patient abdominal pelvic CT shows: 1.  No acute abnormality.  No evidence of appendicitis, diverticulitis, masses or cancer. 2.  Moderate stool in the colon consistent with constipation 3.  Incidental findings: Peristalsis in the stomach which cleared on delayed imaging.  Large duodenal diverticulum.  No evidence of infection or inflammation in the small intestine.  No evidence of inflammatory bowel disease.  Nothing worrisome. 4.  Patient needs to take treatment every day for chronic constipation.  I recently gave her samples of Linzess.  If she did not tolerate Linzess, then she can restart OTC MiraLAX powder take 1 or 2 capfuls in a drink once or twice daily.  Give this treatment time to work and take it consistently every day! Celso Amy, PA-C

## 2023-01-23 NOTE — Progress Notes (Signed)
Call and notify patient abdominal pelvic CT shows: 1.  No acute abnormality.  No evidence of appendicitis, diverticulitis, masses or cancer. 2.  Moderate stool in the colon consistent with constipation 3.  Incidental findings: Peristalsis in the stomach which cleared on delayed imaging.  Large duodenal diverticulum.  No evidence of infection or inflammation in the small intestine.  No evidence of inflammatory bowel disease.  Nothing worrisome. 4.  Patient needs to take treatment every day for chronic constipation.  I recently gave her samples of Linzess.  If she did not tolerate Linzess, then she can restart OTC MiraLAX powder take 1 or 2 capfuls in a drink once or twice daily.  Give this treatment time to work and take it consistently every day! Celso Amy, PA-C

## 2023-02-02 ENCOUNTER — Encounter: Payer: Self-pay | Admitting: Primary Care

## 2023-02-02 ENCOUNTER — Ambulatory Visit (INDEPENDENT_AMBULATORY_CARE_PROVIDER_SITE_OTHER): Payer: Medicare HMO | Admitting: Primary Care

## 2023-02-02 VITALS — BP 144/86 | HR 82 | Temp 97.8°F | Ht 61.0 in | Wt 102.0 lb

## 2023-02-02 DIAGNOSIS — N898 Other specified noninflammatory disorders of vagina: Secondary | ICD-10-CM

## 2023-02-02 DIAGNOSIS — N9489 Other specified conditions associated with female genital organs and menstrual cycle: Secondary | ICD-10-CM | POA: Diagnosis not present

## 2023-02-02 LAB — POC URINALSYSI DIPSTICK (AUTOMATED)
Bilirubin, UA: NEGATIVE
Blood, UA: NEGATIVE
Glucose, UA: NEGATIVE
Ketones, UA: NEGATIVE
Leukocytes, UA: NEGATIVE
Nitrite, UA: NEGATIVE
Protein, UA: NEGATIVE
Spec Grav, UA: <=1.005 — AB (ref 1.010–1.025)
Urobilinogen, UA: 0.2 U/dL
pH, UA: 6 (ref 5.0–8.0)

## 2023-02-02 NOTE — Assessment & Plan Note (Signed)
Differentials include vaginitis, cystitis, skin irritation from diarrhea. Vaginal exam today overall benign. No rashes or skin breakdown.  Urinalysis negative today.  Wet prep collected and pending.  Continue Desitin. Await results.

## 2023-02-02 NOTE — Progress Notes (Signed)
Subjective:    Patient ID: Kristen Caldwell, female    DOB: 02-Apr-1945, 77 y.o.   MRN: [id number]  HPI  Kristen Caldwell is a very pleasant 77 y.o. female with a history of chronic constipation, CKD, vaginal atrophy, who presents today to discuss vaginal discomfort.  Symptoms include vaginal burning and rectal burning to the skin with urination which began a few days ago. She does have a mild amount of vaginal itching.  Recently completed a CT entero abdomen/pelvis for abdominal pain on 01/23/23. The scan showed moderate constipation, was told to start Miralax daily. She began taking Miralax, was having watery diarrhea several times daily. Prior to taking Miralax she was on Lactulose for which she took for a few days but this caused GI upset and nausea.   She stopped taking Miralax a few days ago. She's been using Desitin to the rectal area which has helped with her rectal burning.   She denies vaginal discharge, hematuria, fevers.  She endorses a history of vaginal yeast infections and acute cystitis. She denies recent antibiotic use.    Review of Systems  Gastrointestinal:  Negative for abdominal pain.  Genitourinary:  Positive for dysuria. Negative for hematuria, pelvic pain, vaginal bleeding and vaginal discharge.         Past Medical History:  Diagnosis Date   Anxiety    Chronic headaches    COVID 03/03/2020   Depression    Diverticulosis    GERD (gastroesophageal reflux disease)    Osteoporosis     Social History   Socioeconomic History   Marital status: Married    Spouse name: Chrissie Noa   Number of children: 2   Years of education: high school   Highest education level: 12th grade  Occupational History   Not on file  Tobacco Use   Smoking status: Never   Smokeless tobacco: Never  Vaping Use   Vaping status: Never Used  Substance and Sexual Activity   Alcohol use: No   Drug use: No   Sexual activity: Not Currently    Partners: Male   Other Topics Concern   Not on file  Social History Narrative   04/20/20   From: the area   Living: with son - Maurine Minister and husband is in long term care   Work: part-time 2 days a week, making window treatments      Family: Maurine Minister and Zella Ball - 2 grandchildren, and 1 adopted grandchildren       Enjoys: work is fun it is with friends      Exercise: walking, bending at work   Diet: tries to eat healthy      Safety   Seat belts: Yes    Guns: Yes  and secure   Safe in relationships: Yes             Social Determinants of Health   Financial Resource Strain: Low Risk  (12/21/2022)   Overall Financial Resource Strain (CARDIA)    Difficulty of Paying Living Expenses: Not hard at all  Food Insecurity: No Food Insecurity (12/21/2022)   Hunger Vital Sign    Worried About Running Out of Food in the Last Year: Never true    Ran Out of Food in the Last Year: Never true  Transportation Needs: No Transportation Needs (12/21/2022)   PRAPARE - Administrator, Civil Service (Medical): No    Lack of Transportation (Non-Medical): No  Physical Activity: Insufficiently Active (12/21/2022)   Exercise Vital  Sign    Days of Exercise per Week: 3 days    Minutes of Exercise per Session: 20 min  Stress: No Stress Concern Present (12/21/2022)   [employer] of Occupational Health - Occupational Stress Questionnaire    Feeling of Stress : Only a little  Social Connections: Unknown (12/21/2022)   Social Connection and Isolation Panel [NHANES]    Frequency of Communication with Friends and Family: More than three times a week    Frequency of Social Gatherings with Friends and Family: Three times a week    Attends Religious Services: Patient declined    Active Member of Clubs or Organizations: No    Attends Banker Meetings: Never    Marital Status: Married  Recent Concern: Social Connections - Moderately Isolated (11/21/2022)   Social Connection and Isolation Panel [NHANES]     Frequency of Communication with Friends and Family: More than three times a week    Frequency of Social Gatherings with Friends and Family: More than three times a week    Attends Religious Services: Never    Database administrator or Organizations: No    Attends Banker Meetings: Never    Marital Status: Married  Catering manager Violence: Not At Risk (11/21/2022)   Humiliation, Afraid, Rape, and Kick questionnaire    Fear of Current or Ex-Partner: No    Emotionally Abused: No    Physically Abused: No    Sexually Abused: No    Past Surgical History:  Procedure Laterality Date   ABDOMINAL HYSTERECTOMY  03/14/1989   partial   COLONOSCOPY     COLONOSCOPY WITH PROPOFOL N/A 12/14/2021   Procedure: COLONOSCOPY WITH PROPOFOL;  Surgeon: Toney Reil, MD;  Location: [facility] ENDOSCOPY;  Service: Gastroenterology;  Laterality: N/A;   TUBAL LIGATION      Family History  Problem Relation Age of Onset   Aneurysm Mother    Stroke Father    Hypertension Father    Lupus Sister    Osteoporosis Sister    Hypertension Brother    Heart attack Brother    Breast cancer Paternal Aunt    Diabetes Paternal Grandfather    Colon cancer Neg Hx     Allergies  Allergen Reactions   Cefuroxime Nausea Only   Lagevrio [Molnupiravir] Nausea Only    Rash    Amoxicillin Nausea Only    REACTION: NAUSEA   Cephalexin     REACTION: NAUSEA AND VOMITING   Promethazine Hcl Rash   Sertraline Diarrhea   Sulfa Antibiotics Nausea Only    Current Outpatient Medications on File Prior to Visit  Medication Sig Dispense Refill   amitriptyline (ELAVIL) 25 MG tablet Take 1 tablet (25 mg total) by mouth at bedtime. 90 tablet 3   Calcium Carbonate-Vit D-Min (CALTRATE 600+D PLUS) 600-400 MG-UNIT per tablet Take 2 tablets by mouth daily.     clorazepate (TRANXENE) 3.75 MG tablet Take 1 tablet by mouth once daily 90 tablet 0   Cranberry (AZO CRANBERRY GUMMIES) 250 MG CHEW Chew 250 mg by mouth daily.      meclizine (ANTIVERT) 12.5 MG tablet Take 1 tablet (12.5 mg total) by mouth 3 (three) times daily as needed for dizziness. 30 tablet 0   Multiple Vitamins-Minerals (CENTRUM SILVER) CHEW Chew 1 tablet by mouth daily.     Omega-3 Fatty Acids (FISH OIL PO) Take 1 capsule by mouth 2 (two) times daily.     omeprazole (PRILOSEC) 20 MG capsule Take 1 capsule by mouth  once daily 90 capsule 0   ondansetron (ZOFRAN) 4 MG tablet Take 1 tablet (4 mg total) by mouth every 8 (eight) hours as needed for nausea or vomiting. 20 tablet 0   topiramate (TOPAMAX) 25 MG tablet Take 1 tablet by mouth twice daily 180 tablet 0   TURMERIC PO Take by mouth.     vitamin B-12 (CYANOCOBALAMIN) 1000 MCG tablet Take 1,000 mcg by mouth daily.     Lactulose 20 GM/30ML SOLN Take 30 mLs (20 g total) by mouth daily. (Patient not taking: Reported on 02/02/2023) 900 mL 5   No current facility-administered medications on file prior to visit.    BP (!) 144/86   Pulse 82   Temp 97.8 F (36.6 C) (Temporal)   Ht 5\' 1"  (1.549 m)   Wt 102 lb (46.3 kg)   SpO2 99%   BMI 19.27 kg/m  Objective:   Physical Exam Exam conducted with a chaperone present.  Constitutional:      General: She is not in acute distress. Cardiovascular:     Rate and Rhythm: Normal rate.  Pulmonary:     Effort: Pulmonary effort is normal.  Genitourinary:    Labia:        Right: No rash, tenderness or lesion.        Left: No rash, tenderness or lesion.      Vagina: Normal. No vaginal discharge, erythema, bleeding or lesions.  Neurological:     Mental Status: She is alert.           Assessment & Plan:  Vaginal itching -     WET PREP BY MOLECULAR PROBE  Vaginal burning Assessment & Plan: Differentials include vaginitis, cystitis, skin irritation from diarrhea. Vaginal exam today overall benign. No rashes or skin breakdown.  Urinalysis negative today.  Wet prep collected and pending.  Continue Desitin. Await results.    Orders: -      POCT Urinalysis Dipstick (Automated)        Doreene Nest, NP

## 2023-02-02 NOTE — Patient Instructions (Signed)
We will be in touch once we receive your vaginal swab results.  Use the Desitin for now.  It was a pleasure to see you today!

## 2023-02-03 ENCOUNTER — Other Ambulatory Visit: Payer: Self-pay | Admitting: Primary Care

## 2023-02-03 ENCOUNTER — Telehealth: Payer: Self-pay | Admitting: Family

## 2023-02-03 DIAGNOSIS — B9689 Other specified bacterial agents as the cause of diseases classified elsewhere: Secondary | ICD-10-CM

## 2023-02-03 LAB — WET PREP BY MOLECULAR PROBE
Candida species: NOT DETECTED
MICRO NUMBER:: 15763019
SPECIMEN QUALITY:: ADEQUATE
Trichomonas vaginosis: NOT DETECTED

## 2023-02-03 MED ORDER — METRONIDAZOLE 500 MG PO TABS: 500.0000 mg | ORAL_TABLET | Freq: Two times a day (BID) | ORAL | 0 refills | Status: AC

## 2023-02-03 MED ORDER — METRONIDAZOLE 0.75 % VA GEL: 1.0000 | Freq: Two times a day (BID) | VAGINAL | 0 refills | Status: DC

## 2023-02-03 NOTE — Telephone Encounter (Signed)
Noted.  See result note.

## 2023-02-03 NOTE — Telephone Encounter (Signed)
Patient would like to try pill and hope it doesn't make her sick  metroNIDAZOLE metroNIDAZOLE (METROGEL ) 0.75 % vaginal gel  250 mg tablet is cheaper just $10 monthly  Less expensive, patient stated someone was checking on which pill or cream would be less expensive, felt like all creams would be expensive  Waiting on someone to call her back  [phone]

## 2023-02-05 ENCOUNTER — Other Ambulatory Visit: Payer: Self-pay | Admitting: Family

## 2023-02-05 DIAGNOSIS — F411 Generalized anxiety disorder: Secondary | ICD-10-CM

## 2023-02-07 ENCOUNTER — Ambulatory Visit (INDEPENDENT_AMBULATORY_CARE_PROVIDER_SITE_OTHER): Payer: Medicare HMO | Admitting: Family

## 2023-02-07 ENCOUNTER — Telehealth: Payer: Self-pay | Admitting: Family

## 2023-02-07 VITALS — BP 106/68 | HR 98 | Temp 98.0°F | Ht 61.0 in | Wt 98.2 lb

## 2023-02-07 DIAGNOSIS — N1831 Chronic kidney disease, stage 3a: Secondary | ICD-10-CM

## 2023-02-07 DIAGNOSIS — K219 Gastro-esophageal reflux disease without esophagitis: Secondary | ICD-10-CM | POA: Diagnosis not present

## 2023-02-07 DIAGNOSIS — E785 Hyperlipidemia, unspecified: Secondary | ICD-10-CM

## 2023-02-07 DIAGNOSIS — Z860100 Personal history of colon polyps, unspecified: Secondary | ICD-10-CM | POA: Diagnosis not present

## 2023-02-07 DIAGNOSIS — Z0000 Encounter for general adult medical examination without abnormal findings: Secondary | ICD-10-CM | POA: Insufficient documentation

## 2023-02-07 DIAGNOSIS — M810 Age-related osteoporosis without current pathological fracture: Secondary | ICD-10-CM | POA: Diagnosis not present

## 2023-02-07 DIAGNOSIS — H9201 Otalgia, right ear: Secondary | ICD-10-CM | POA: Diagnosis not present

## 2023-02-07 LAB — LIPID PANEL
Cholesterol: 189 mg/dL (ref 0–200)
HDL: 72.3 mg/dL (ref >39.00)
LDL Cholesterol: 102 mg/dL — ABNORMAL HIGH (ref 0–99)
NonHDL: 116.86
Total CHOL/HDL Ratio: 3
Triglycerides: 76 mg/dL (ref 0.0–149.0)
VLDL: 15.2 mg/dL (ref 0.0–40.0)

## 2023-02-07 MED ORDER — PREDNISONE 10 MG (21) PO TBPK: ORAL_TABLET | ORAL | 0 refills | Status: DC

## 2023-02-07 NOTE — Patient Instructions (Signed)
  You can try out A&D ointment for your vaginal soreness.

## 2023-02-07 NOTE — Telephone Encounter (Signed)
Patient is needing prior authorization for medication clorazepate (TRANXENE) 3.75 MG tablet , please advise

## 2023-02-07 NOTE — Progress Notes (Unsigned)
Subjective:  Patient ID: Kristen Caldwell, female    DOB: 10-Sep-1945  Age: 77 y.o. MRN: [id number]  Patient Care Team: Mort Sawyers, FNP as PCP - General (Family Medicine) Romero Belling, MD (Inactive) as Consulting Physician (Endocrinology) Kathyrn Sheriff, [facility] (Inactive) as Pharmacist (Pharmacist)   CC:  Chief Complaint  Patient presents with   Annual Exam    HPI Kristen Caldwell is a 77 y.o. female who presents today for an annual physical exam. She reports consuming a general diet. The patient has a physically strenuous job, but has no regular exercise apart from work.  She generally feels well. She reports sleeping well. She does have additional problems to discuss today.   Vision:Within last year Dental:Receives regular dental care  Mammogram: 09/12/22 Last pap: > 1 y/o  Colonoscopy:12/14/21, repeat in three years  Bone density scan: wants to wait for next year for when she gets her mammogram  Pt is with acute concerns.  Right ear feels muffled for the last three days. Harder to hear out of her ear, and felt some fullness. Started taking zyrtec, felt slightly better yesterday but this am feels 'funny' again.      Advanced Directives Patient does not have advanced directives   DEPRESSION SCREENING    11/21/2022    8:21 AM 08/30/2022   11:49 AM 11/16/2021   10:46 AM 11/12/2020    3:15 PM 11/08/2019    3:38 PM 11/02/2018    9:04 AM 07/14/2017    9:26 AM  PHQ 2/9 Scores  PHQ - 2 Score 0 0 0 1 2 0 0  PHQ- 9 Score  2 0 2 2 1       ROS: Negative unless specifically indicated above in HPI.    Current Outpatient Medications:    predniSONE (STERAPRED UNI-PAK 21 TAB) 10 MG (21) TBPK tablet, Take as directed, Disp: 1 each, Rfl: 0   amitriptyline (ELAVIL) 25 MG tablet, Take 1 tablet (25 mg total) by mouth at bedtime., Disp: 90 tablet, Rfl: 3   Calcium Carbonate-Vit D-Min (CALTRATE 600+D PLUS) 600-400 MG-UNIT per tablet, Take 2 tablets by mouth daily.,  Disp: , Rfl:    clorazepate (TRANXENE) 3.75 MG tablet, Take 1 tablet by mouth once daily, Disp: 90 tablet, Rfl: 0   Cranberry (AZO CRANBERRY GUMMIES) 250 MG CHEW, Chew 250 mg by mouth daily., Disp: , Rfl:    meclizine (ANTIVERT) 12.5 MG tablet, Take 1 tablet (12.5 mg total) by mouth 3 (three) times daily as needed for dizziness., Disp: 30 tablet, Rfl: 0   metroNIDAZOLE (FLAGYL) 500 MG tablet, Take 1 tablet (500 mg total) by mouth 2 (two) times daily for 7 days., Disp: 14 tablet, Rfl: 0   Multiple Vitamins-Minerals (CENTRUM SILVER) CHEW, Chew 1 tablet by mouth daily., Disp: , Rfl:    Omega-3 Fatty Acids (FISH OIL PO), Take 1 capsule by mouth 2 (two) times daily., Disp: , Rfl:    omeprazole (PRILOSEC) 20 MG capsule, Take 1 capsule by mouth once daily, Disp: 90 capsule, Rfl: 0   ondansetron (ZOFRAN) 4 MG tablet, Take 1 tablet (4 mg total) by mouth every 8 (eight) hours as needed for nausea or vomiting., Disp: 20 tablet, Rfl: 0   topiramate (TOPAMAX) 25 MG tablet, Take 1 tablet by mouth twice daily, Disp: 180 tablet, Rfl: 0   TURMERIC PO, Take by mouth., Disp: , Rfl:    vitamin B-12 (CYANOCOBALAMIN) 1000 MCG tablet, Take 1,000 mcg by mouth daily., Disp: , Rfl:  Objective:    BP 106/68 (BP Location: Left Arm, Patient Position: Sitting, Cuff Size: Normal)   Pulse 98   Temp 98 F (36.7 C) (Temporal)   Ht 5\' 1"  (1.549 m)   Wt 98 lb 3.2 oz (44.5 kg)   SpO2 98%   BMI 18.55 kg/m   BP Readings from Last 3 Encounters:  02/07/23 106/68  02/02/23 (!) 144/86  01/09/23 136/78      Physical Exam Constitutional:      General: She is not in acute distress.    Appearance: Normal appearance. She is normal weight. She is not ill-appearing.  HENT:     Head: Normocephalic.     Right Ear: A middle ear effusion (clear) is present.     Left Ear: Tympanic membrane normal.     Nose: Nose normal.     Mouth/Throat:     Mouth: Mucous membranes are moist.  Eyes:     Extraocular Movements: Extraocular  movements intact.     Pupils: Pupils are equal, round, and reactive to light.  Cardiovascular:     Rate and Rhythm: Normal rate and regular rhythm.  Pulmonary:     Effort: Pulmonary effort is normal.     Breath sounds: Normal breath sounds.  Abdominal:     General: Abdomen is flat. Bowel sounds are normal.     Palpations: Abdomen is soft.     Tenderness: There is no guarding or rebound.  Musculoskeletal:        General: Normal range of motion.     Cervical back: Normal range of motion.  Skin:    General: Skin is warm.     Capillary Refill: Capillary refill takes less than 2 seconds.  Neurological:     General: No focal deficit present.     Mental Status: She is alert.  Psychiatric:        Mood and Affect: Mood normal.        Behavior: Behavior normal.        Thought Content: Thought content normal.        Judgment: Judgment normal.          Assessment & Plan:  Hyperlipidemia, unspecified hyperlipidemia type Assessment & Plan: Ordered lipid panel, pending results. Work on low cholesterol diet and exercise as tolerated   Orders: -     Lipid panel  Gastroesophageal reflux disease, unspecified whether esophagitis present Assessment & Plan: Stable On long term ppi therapy     Osteoporosis without current pathological fracture, unspecified osteoporosis type Assessment & Plan: Declines dexa order today, states will get with her mammogram next year.    Stage 3a chronic kidney disease (HCC)  History of colon polyps Assessment & Plan: Repeat colonoscopy due 2026   Otalgia of right ear Assessment & Plan: Rx prednisone pack  Can consider flonase/antihistamine  Orders: -     predniSONE; Take as directed  Dispense: 1 each; Refill: 0  Encounter for general adult medical examination without abnormal findings Assessment & Plan: Patient Counseling(The following topics were reviewed):  Preventative care handout given to pt  Health maintenance and immunizations  reviewed. Please refer to Health maintenance section. Pt advised on safe sex, wearing seatbelts in car, and proper nutrition labwork ordered today for annual Dental health: Discussed importance of regular tooth brushing, flossing, and dental visits.        Follow-up: Return in about 6 months (around 08/07/2023) for f/u cholesterol.   Mort Sawyers, FNP

## 2023-02-08 ENCOUNTER — Other Ambulatory Visit (HOSPITAL_COMMUNITY): Payer: Self-pay

## 2023-02-08 DIAGNOSIS — H2513 Age-related nuclear cataract, bilateral: Secondary | ICD-10-CM | POA: Diagnosis not present

## 2023-02-08 DIAGNOSIS — M3501 Sicca syndrome with keratoconjunctivitis: Secondary | ICD-10-CM | POA: Diagnosis not present

## 2023-02-08 DIAGNOSIS — Z0100 Encounter for examination of eyes and vision without abnormal findings: Secondary | ICD-10-CM | POA: Diagnosis not present

## 2023-02-08 NOTE — Telephone Encounter (Signed)
Per test claim, there is a prior authorization on file that is effective until 03-14-23. This medication is too soon to fill. Was filled 02-07-2023 and can be filled again on 04-11-2023

## 2023-02-08 NOTE — Assessment & Plan Note (Signed)
Declines dexa order today, states will get with her mammogram next year.

## 2023-02-08 NOTE — Assessment & Plan Note (Signed)
Patient Counseling(The following topics were reviewed): ? Preventative care handout given to pt  ?Health maintenance and immunizations reviewed. Please refer to Health maintenance section. ?Pt advised on safe sex, wearing seatbelts in car, and proper nutrition ?labwork ordered today for annual ?Dental health: Discussed importance of regular tooth brushing, flossing, and dental visits. ? ? ?

## 2023-02-08 NOTE — Assessment & Plan Note (Signed)
Repeat colonoscopy due 2026

## 2023-02-08 NOTE — Assessment & Plan Note (Signed)
Ordered lipid panel, pending results. Work on low cholesterol diet and exercise as tolerated  

## 2023-02-08 NOTE — Assessment & Plan Note (Signed)
Rx prednisone pack  Can consider flonase/antihistamine

## 2023-02-08 NOTE — Assessment & Plan Note (Signed)
Stable On long term ppi therapy

## 2023-02-13 ENCOUNTER — Ambulatory Visit (INDEPENDENT_AMBULATORY_CARE_PROVIDER_SITE_OTHER): Payer: Medicare HMO | Admitting: Family

## 2023-02-13 VITALS — BP 136/82 | HR 98 | Temp 97.6°F | Ht 61.0 in | Wt 98.6 lb

## 2023-02-13 DIAGNOSIS — L900 Lichen sclerosus et atrophicus: Secondary | ICD-10-CM | POA: Insufficient documentation

## 2023-02-13 DIAGNOSIS — N9489 Other specified conditions associated with female genital organs and menstrual cycle: Secondary | ICD-10-CM

## 2023-02-13 DIAGNOSIS — L750 Bromhidrosis: Secondary | ICD-10-CM | POA: Diagnosis not present

## 2023-02-13 DIAGNOSIS — B370 Candidal stomatitis: Secondary | ICD-10-CM | POA: Diagnosis not present

## 2023-02-13 MED ORDER — CLOBETASOL PROPIONATE 0.05 % EX CREA: 1.0000 | TOPICAL_CREAM | Freq: Two times a day (BID) | CUTANEOUS | 0 refills | Status: AC

## 2023-02-13 MED ORDER — NYSTATIN 100000 UNIT/ML MT SUSP: 5.0000 mL | Freq: Four times a day (QID) | OROMUCOSAL | 0 refills | Status: AC

## 2023-02-13 NOTE — Progress Notes (Signed)
Established Patient Office Visit  Subjective:   Patient ID: Kristen Caldwell, female    DOB: 07/31/1945  Age: 76 y.o. MRN: [id number]  CC:  Chief Complaint  Patient presents with   Vaginal Pain    Burning after taking prednisone. Used A&D ointment but made it burn.    Oral Pain    Tongue has been sore     HPI: Kristen Caldwell is a 77 y.o. female presenting on 02/13/2023 for Vaginal Pain (Burning after taking prednisone. Used A&D ointment but made it burn. ) and Oral Pain (Tongue has been sore )  Vaginal burning, still ongoing. Tried AD ointment without relief, did also try desitin but does not help at all. Did see gynecologist in the past and had clobetasol cream. Was also on estradiol for vaginal dryness with gynecology. Urine dip 11/21 negative. Wet prep had been positive for bacterial vaginitis, and completed metronidazole x 7 days. Does see redness in her vaginal area. Her urine has a slight urinary odor. Denies vaginal discharge.    Tongue is burning slightly and a bit sore since taking medications.     ROS: Negative unless specifically indicated above in HPI.   Relevant past medical history reviewed and updated as indicated.   Allergies and medications reviewed and updated.   Current Outpatient Medications:    amitriptyline (ELAVIL) 25 MG tablet, Take 1 tablet (25 mg total) by mouth at bedtime., Disp: 90 tablet, Rfl: 3   Calcium Carbonate-Vit D-Min (CALTRATE 600+D PLUS) 600-400 MG-UNIT per tablet, Take 2 tablets by mouth daily., Disp: , Rfl:    clobetasol cream (TEMOVATE) 0.05 %, Apply 1 Application topically 2 (two) times daily., Disp: 30 g, Rfl: 0   clorazepate (TRANXENE) 3.75 MG tablet, Take 1 tablet by mouth once daily, Disp: 90 tablet, Rfl: 0   Cranberry (AZO CRANBERRY GUMMIES) 250 MG CHEW, Chew 250 mg by mouth daily., Disp: , Rfl:    meclizine (ANTIVERT) 12.5 MG tablet, Take 1 tablet (12.5 mg total) by mouth 3 (three) times daily as needed for  dizziness., Disp: 30 tablet, Rfl: 0   Multiple Vitamins-Minerals (CENTRUM SILVER) CHEW, Chew 1 tablet by mouth daily., Disp: , Rfl:    nystatin (MYCOSTATIN) 100000 UNIT/ML suspension, Take 5 mLs (500,000 Units total) by mouth 4 (four) times daily for 7 days. Swish in mouth for at least 60 seconds, and then spit out. Do not swallow., Disp: 140 mL, Rfl: 0   Omega-3 Fatty Acids (FISH OIL PO), Take 1 capsule by mouth 2 (two) times daily., Disp: , Rfl:    omeprazole (PRILOSEC) 20 MG capsule, Take 1 capsule by mouth once daily, Disp: 90 capsule, Rfl: 0   ondansetron (ZOFRAN) 4 MG tablet, Take 1 tablet (4 mg total) by mouth every 8 (eight) hours as needed for nausea or vomiting., Disp: 20 tablet, Rfl: 0   topiramate (TOPAMAX) 25 MG tablet, Take 1 tablet by mouth twice daily, Disp: 180 tablet, Rfl: 0   TURMERIC PO, Take by mouth., Disp: , Rfl:    vitamin B-12 (CYANOCOBALAMIN) 1000 MCG tablet, Take 1,000 mcg by mouth daily., Disp: , Rfl:   Allergies  Allergen Reactions   Cefuroxime Nausea Only   Lagevrio [Molnupiravir] Nausea Only    Rash    Amoxicillin Nausea Only    REACTION: NAUSEA   Cephalexin     REACTION: NAUSEA AND VOMITING   Promethazine Hcl Rash   Sertraline Diarrhea   Sulfa Antibiotics Nausea Only    Objective:  BP 136/82   Pulse 98   Temp 97.6 F (36.4 C) (Oral)   Ht 5\' 1"  (1.549 m)   Wt 98 lb 9.6 oz (44.7 kg)   SpO2 99%   BMI 18.63 kg/m    Physical Exam Constitutional:      General: She is not in acute distress.    Appearance: Normal appearance. She is normal weight. She is not ill-appearing, toxic-appearing or diaphoretic.  HENT:     Head: Normocephalic.     Mouth/Throat:     Mouth: Oral lesions (white budding) present.  Cardiovascular:     Rate and Rhythm: Normal rate.  Pulmonary:     Effort: Pulmonary effort is normal.  Genitourinary:    General: Normal vulva.     Labia:        Right: Lesion present. No rash or tenderness.        Left: Lesion present. No  rash or tenderness.      Vagina: Tenderness and bleeding present. No vaginal discharge or erythema.     Comments: Bil white pigmentation mid to lower perineal area  Slight redness and tenderness bil labial majoras Musculoskeletal:        General: Normal range of motion.  Neurological:     General: No focal deficit present.     Mental Status: She is alert and oriented to person, place, and time. Mental status is at baseline.  Psychiatric:        Mood and Affect: Mood normal.        Behavior: Behavior normal.        Thought Content: Thought content normal.        Judgment: Judgment normal.     Assessment & Plan:  Urinary body odor -     WET PREP BY MOLECULAR PROBE -     Urine Culture  Vaginal burning -     WET PREP BY MOLECULAR PROBE -     Urine Culture  Oral candida Assessment & Plan: Nystatin mouth wash   Orders: -     Nystatin; Take 5 mLs (500,000 Units total) by mouth 4 (four) times daily for 7 days. Swish in mouth for at least 60 seconds, and then spit out. Do not swallow.  Dispense: 140 mL; Refill: 0  Lichen sclerosus Assessment & Plan: Rx clobetasol cream Referral to gyn  Repeat wet prep to verify resolution of BV  Urine culture to r/o UTI  Orders: -     Clobetasol Propionate; Apply 1 Application topically 2 (two) times daily.  Dispense: 30 g; Refill: 0     Follow up plan: Return if symptoms worsen or fail to improve.  Mort Sawyers, FNP

## 2023-02-13 NOTE — Assessment & Plan Note (Signed)
 Nystatin mouthwash

## 2023-02-13 NOTE — Patient Instructions (Signed)
  A referral was placed today for gynecology just in case this does not improve.  Please let us know if you have not heard back within 2 weeks about the referral.

## 2023-02-13 NOTE — Progress Notes (Unsigned)
Celso Amy, PA-C [address]  [address]  [place], [place] [address]  Main: [phone]  Fax: [phone]   Primary Care Physician: Mort Sawyers, FNP  Primary Gastroenterologist:  Celso Amy, PA-C / Dr. Lannette Donath    CC: Follow-up abdominal pain, constipation, bloating  HPI: Kristen Caldwell is a 77 y.o. female returns for 5-week follow-up of generalized abdominal pain, chronic constipation, bloating, eand history of adenomatous colon polyps.  5 weeks ago she was tried on Linzess 72 and 145 mcg samples.  Linzess caused nausea and she discontinued.  Then she was given lactulose was also caused nausea and she discontinued.  Most recently she took MiraLAX 1 capful daily with great benefit.  She had multiple bowel movements and her abdominal pain improved.  She has no more abdominal pain.  Abdomen currently feels soft.  She is feeling a lot better.  She is afraid to continue MiraLAX long-term.  Has been taking MiraLAX as needed.  Labs 01/09/2023: Normal CMP, CRP, TSH and CBC.  Hemoglobin 15.2.  Abdominal pelvic CT enterography with contrast 01/23/2023 showed moderate stool in the colon consistent with constipation.  Large duodenal diverticula.  Evidence of malrotation.  No acute abnormality.  Colonoscopy 12/14/2021 by Dr. Allegra Lai showed 9 mm and 15 mm adenomatous polyps removed from transverse colon.  Good prep.  3-year repeat.   Lab 12/21/2022 alpha gal and food allergy panel labs were negative.  Celiac labs negative.  Fecal calprotectin was elevated at 153.     Abdominal x-ray 12/21/2022 showed moderate amount of stool in the colon consistent with constipation.  Current Outpatient Medications  Medication Sig Dispense Refill   amitriptyline (ELAVIL) 25 MG tablet Take 1 tablet (25 mg total) by mouth at bedtime. 90 tablet 3   Calcium Carbonate-Vit D-Min (CALTRATE 600+D PLUS) 600-400 MG-UNIT per tablet Take 2 tablets by mouth daily.     clobetasol cream (TEMOVATE) 0.05  % Apply 1 Application topically 2 (two) times daily. 30 g 0   clorazepate (TRANXENE) 3.75 MG tablet Take 1 tablet by mouth once daily 90 tablet 0   Cranberry (AZO CRANBERRY GUMMIES) 250 MG CHEW Chew 250 mg by mouth daily.     meclizine (ANTIVERT) 12.5 MG tablet Take 1 tablet (12.5 mg total) by mouth 3 (three) times daily as needed for dizziness. 30 tablet 0   Multiple Vitamins-Minerals (CENTRUM SILVER) CHEW Chew 1 tablet by mouth daily.     nystatin (MYCOSTATIN) 100000 UNIT/ML suspension Take 5 mLs (500,000 Units total) by mouth 4 (four) times daily for 7 days. Swish in mouth for at least 60 seconds, and then spit out. Do not swallow. 140 mL 0   Omega-3 Fatty Acids (FISH OIL PO) Take 1 capsule by mouth 2 (two) times daily.     omeprazole (PRILOSEC) 20 MG capsule Take 1 capsule by mouth once daily 90 capsule 0   ondansetron (ZOFRAN) 4 MG tablet Take 1 tablet (4 mg total) by mouth every 8 (eight) hours as needed for nausea or vomiting. 20 tablet 0   topiramate (TOPAMAX) 25 MG tablet Take 1 tablet by mouth twice daily 180 tablet 0   TURMERIC PO Take by mouth.     vitamin B-12 (CYANOCOBALAMIN) 1000 MCG tablet Take 1,000 mcg by mouth daily.     No current facility-administered medications for this visit.    Allergies as of 02/14/2023 - Review Complete 02/14/2023  Allergen Reaction Noted   Cefuroxime Nausea Only 07/19/2021   Lagevrio [molnupiravir] Nausea  Only 06/06/2022   Amoxicillin Nausea Only 08/25/2006   Cephalexin  03/05/2007   Promethazine hcl Rash 11/01/2010   Sertraline Diarrhea 05/03/2019   Sulfa antibiotics Nausea Only 02/14/2022    Past Medical History:  Diagnosis Date   Anxiety    Chronic headaches    COVID 03/03/2020   Depression    Diverticulosis    GERD (gastroesophageal reflux disease)    Osteoporosis     Past Surgical History:  Procedure Laterality Date   ABDOMINAL HYSTERECTOMY  03/14/1989   partial   COLONOSCOPY     COLONOSCOPY WITH PROPOFOL N/A 12/14/2021    Procedure: COLONOSCOPY WITH PROPOFOL;  Surgeon: Toney Reil, MD;  Location: [facility] ENDOSCOPY;  Service: Gastroenterology;  Laterality: N/A;   TUBAL LIGATION      Review of Systems:    All systems reviewed and negative except where noted in HPI.   Physical Examination:   BP 132/80 (BP Location: Left Arm, Patient Position: Sitting, Cuff Size: Normal)   Pulse 91   Temp 98 F (36.7 C) (Oral)   Ht 5\' 1"  (1.549 m)   Wt 98 lb 6 oz (44.6 kg)   BMI 18.59 kg/m   General: Well-nourished, well-developed in no acute distress.  Lungs: Clear to auscultation bilaterally. Non-labored. Heart: Regular rate and rhythm, no murmurs rubs or gallops.  Abdomen: Bowel sounds are normal; Abdomen is Soft; No hepatosplenomegaly, masses or hernias;  No Abdominal Tenderness; No guarding or rebound tenderness. Neuro: Alert and oriented x 3.  Grossly intact.  Psych: Alert and cooperative, normal mood and affect.   Imaging Studies: No results found.  Assessment and Plan:   Kristen Caldwell is a 77 y.o. y/o female returns for follow-up of chronic abdominal pain due to chronic constipation.  Suspicious for irritable bowel syndrome with constipation predominant.  She could not tolerate Linzess or lactulose which caused nausea.  Most recently took MiraLAX which worked well.  Recent abdominal pelvic CT enterography showed constipation, otherwise no acute abnormality to explain abdominal pain.  Currently her abdominal pain and constipation have  improved on MiraLAX.    1.  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  Continue Miralax 1/2 to 1 capful every day.  Gave patient reassurance it is safe to take MiraLAX long-term.  Encouraged her to take it every day consistently.  Adjust dose based on bowel habits.  Reassurance regarding recent CT results.  2.  History of adenomatous colon polyps  I discussed her colonoscopy results at length.  She is advised 3-year repeat colonoscopy will be  due 12/2024.  Celso Amy, PA-C  Follow up in 2 years to repeat colonoscopy.  Follow-up sooner if she has worsening GI symptoms.

## 2023-02-13 NOTE — Assessment & Plan Note (Signed)
Rx clobetasol cream Referral to gyn  Repeat wet prep to verify resolution of BV  Urine culture to r/o UTI

## 2023-02-14 ENCOUNTER — Encounter: Payer: Self-pay | Admitting: Physician Assistant

## 2023-02-14 ENCOUNTER — Ambulatory Visit: Payer: Medicare HMO | Admitting: Physician Assistant

## 2023-02-14 VITALS — BP 132/80 | HR 91 | Temp 98.0°F | Ht 61.0 in | Wt 98.4 lb

## 2023-02-14 DIAGNOSIS — R109 Unspecified abdominal pain: Secondary | ICD-10-CM | POA: Diagnosis not present

## 2023-02-14 DIAGNOSIS — G8929 Other chronic pain: Secondary | ICD-10-CM | POA: Diagnosis not present

## 2023-02-14 DIAGNOSIS — Z860101 Personal history of adenomatous and serrated colon polyps: Secondary | ICD-10-CM

## 2023-02-14 DIAGNOSIS — K5904 Chronic idiopathic constipation: Secondary | ICD-10-CM | POA: Diagnosis not present

## 2023-02-14 DIAGNOSIS — Z860100 Personal history of colon polyps, unspecified: Secondary | ICD-10-CM

## 2023-02-14 LAB — WET PREP BY MOLECULAR PROBE
Candida species: NOT DETECTED
Gardnerella vaginalis: NOT DETECTED
MICRO NUMBER:: 15796595
SPECIMEN QUALITY:: ADEQUATE
Trichomonas vaginosis: NOT DETECTED

## 2023-02-14 LAB — URINE CULTURE
MICRO NUMBER:: 15796592
Result:: NO GROWTH
SPECIMEN QUALITY:: ADEQUATE

## 2023-02-17 ENCOUNTER — Telehealth: Payer: Self-pay | Admitting: Family

## 2023-02-17 DIAGNOSIS — K1379 Other lesions of oral mucosa: Secondary | ICD-10-CM

## 2023-02-17 DIAGNOSIS — K146 Glossodynia: Secondary | ICD-10-CM

## 2023-02-17 MED ORDER — LIDOCAINE VISCOUS HCL 2 % MT SOLN: 5.0000 mL | Freq: Four times a day (QID) | OROMUCOSAL | 0 refills | Status: AC

## 2023-02-17 NOTE — Addendum Note (Signed)
Addended by: Mort Sawyers on: 02/17/2023 04:32 PM   Modules accepted: Orders

## 2023-02-17 NOTE — Telephone Encounter (Signed)
Spoke with pt and she states that she is not improving on Nystatin for oral thrush. Reports that her mouth is still hurting and that "everything still looks the same." States that the Nystatin is making everything in her mouth burn and is afraid to keep using it if it's not helping anything. Pt would like to have something else sent in.

## 2023-02-17 NOTE — Telephone Encounter (Signed)
Pt called stating she hasn't been feeling no relief since she's been taking nystatin (MYCOSTATIN) 100000 UNIT/ML suspension. Pt states she thought some type of relief would be shown by today since she's been taking meds since Mon, 12/2. Pt states she was advised to take the meds for a week, which will be 3 days. Please advise. Call back # [phone]

## 2023-02-19 NOTE — Telephone Encounter (Signed)
Pt needs to call office to schedule lab only appt.  Lab requires appts

## 2023-02-20 ENCOUNTER — Ambulatory Visit: Payer: Medicare HMO | Admitting: Certified Nurse Midwife

## 2023-02-20 ENCOUNTER — Other Ambulatory Visit: Payer: Self-pay | Admitting: Family

## 2023-02-20 ENCOUNTER — Other Ambulatory Visit (HOSPITAL_COMMUNITY)
Admission: RE | Admit: 2023-02-20 | Discharge: 2023-02-20 | Disposition: A | Discharge status: Home / Self Care | Payer: Medicare HMO | Source: Ambulatory Visit | Attending: Certified Nurse Midwife | Admitting: Certified Nurse Midwife

## 2023-02-20 ENCOUNTER — Other Ambulatory Visit (INDEPENDENT_AMBULATORY_CARE_PROVIDER_SITE_OTHER): Payer: Medicare HMO

## 2023-02-20 VITALS — BP 155/89 | HR 96 | Wt 97.0 lb

## 2023-02-20 DIAGNOSIS — K146 Glossodynia: Secondary | ICD-10-CM | POA: Diagnosis not present

## 2023-02-20 DIAGNOSIS — N949 Unspecified condition associated with female genital organs and menstrual cycle: Secondary | ICD-10-CM | POA: Diagnosis not present

## 2023-02-20 DIAGNOSIS — R718 Other abnormality of red blood cells: Secondary | ICD-10-CM

## 2023-02-20 DIAGNOSIS — R102 Pelvic and perineal pain: Secondary | ICD-10-CM | POA: Diagnosis not present

## 2023-02-20 DIAGNOSIS — R300 Dysuria: Secondary | ICD-10-CM | POA: Diagnosis not present

## 2023-02-20 DIAGNOSIS — K1379 Other lesions of oral mucosa: Secondary | ICD-10-CM | POA: Diagnosis not present

## 2023-02-20 LAB — CBC
HCT: 46.4 % — ABNORMAL HIGH (ref 36.0–46.0)
Hemoglobin: 15.4 g/dL — ABNORMAL HIGH (ref 12.0–15.0)
MCHC: 33.2 g/dL (ref 30.0–36.0)
MCV: 94 fL (ref 78.0–100.0)
Platelets: 235 10*3/uL (ref 150.0–400.0)
RBC: 4.93 Mil/uL (ref 3.87–5.11)
RDW: 12.8 % (ref 11.5–15.5)
WBC: 10.3 10*3/uL (ref 4.0–10.5)

## 2023-02-20 LAB — SEDIMENTATION RATE: Sed Rate: 12 mm/h (ref 0–30)

## 2023-02-20 MED ORDER — ESTRADIOL 0.1 MG/GM VA CREA: 0.5000 | TOPICAL_CREAM | Freq: Every day | VAGINAL | 12 refills | Status: DC

## 2023-02-20 NOTE — Addendum Note (Signed)
Addended by: Alvina Chou on: 02/20/2023 03:30 PM   Modules accepted: Orders

## 2023-02-20 NOTE — Progress Notes (Unsigned)
Mort Sawyers, FNP   No chief complaint on file.   HPI:      Kristen Caldwell is a 77 y.o. No obstetric history on file. whose LMP was No LMP recorded. 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   Postural dizziness with near syncope 05/20/2020   CKD (chronic kidney disease) stage 3, GFR 30-59 ml/min (HCC) 01/07/2020   Chronic bilateral low back pain without sciatica 01/07/2020   Dupuytren's contracture of right hand 11/11/2019   Postmenopausal HRT (hormone replacement therapy) 05/23/2013   HLD (hyperlipidemia) 06/09/2008   Generalized anxiety disorder with panic attacks 02/21/2007   Depression 02/21/2007   Migraines 02/21/2007   GERD 02/21/2007   Osteoporosis 02/21/2007   CARPAL TUNNEL SYNDROME, HX OF 02/21/2007    Past Surgical History:  Procedure Laterality Date   ABDOMINAL HYSTERECTOMY  03/14/1989   partial   COLONOSCOPY     COLONOSCOPY WITH PROPOFOL N/A 12/14/2021   Procedure: COLONOSCOPY WITH PROPOFOL;  Surgeon: Toney Reil, MD;  Location: [facility] ENDOSCOPY;  Service: Gastroenterology;  Laterality: N/A;   TUBAL LIGATION      Family History  Problem Relation Age of Onset   Aneurysm Mother    Stroke Father    Hypertension Father    Lupus Sister    Osteoporosis Sister    Hypertension Brother    Heart attack Brother    Breast cancer Paternal Aunt    Diabetes Paternal  Grandfather    Colon cancer Neg Hx     Social History   Socioeconomic History   Marital status: Married    Spouse name: Chrissie Noa   Number of children: 2   Years of education: high school   Highest education level: 12th grade  Occupational History   Not on file  Tobacco Use   Smoking status: Never   Smokeless tobacco: Never  Vaping Use   Vaping status: Never Used  Substance and Sexual Activity   Alcohol use: No   Drug use: No   Sexual activity: Not Currently    Partners: Male  Other Topics Concern   Not on file  Social History Narrative   04/20/20   From: the area   Living: with son - Maurine Minister and husband is in long term care   Work: part-time 2 days a week, making window treatments      Family: Maurine Minister and Zella Ball - 2 grandchildren, and 1 adopted grandchildren       Enjoys: work is fun it is with friends      Exercise: walking, bending at work   Diet: tries to eat healthy      Safety   Seat belts: Yes    Guns: Yes  and secure   Safe in relationships: Yes             Social Determinants of Health   Financial Resource Strain: Low Risk  (02/07/2023)   Overall Physicist, medical Strain (  CARDIA)    Difficulty of Paying Living Expenses: Not very hard  Food Insecurity: No Food Insecurity (02/07/2023)   Hunger Vital Sign    Worried About Running Out of Food in the Last Year: Never true    Ran Out of Food in the Last Year: Never true  Transportation Needs: No Transportation Needs (02/07/2023)   PRAPARE - Administrator, Civil Service (Medical): No    Lack of Transportation (Non-Medical): No  Physical Activity: Insufficiently Active (02/07/2023)   Exercise Vital Sign    Days of Exercise per Week: 2 days    Minutes of Exercise per Session: 20 min  Stress: No Stress Concern Present (02/07/2023)   [employer] of Occupational Health - Occupational Stress Questionnaire    Feeling of Stress : Only a little  Social Connections: Unknown (02/07/2023)    Social Connection and Isolation Panel [NHANES]    Frequency of Communication with Friends and Family: Twice a week    Frequency of Social Gatherings with Friends and Family: Twice a week    Attends Religious Services: Patient declined    Database administrator or Organizations: No    Attends Engineer, structural: Never    Marital Status: Married  Recent Concern: Social Connections - Moderately Isolated (11/21/2022)   Social Connection and Isolation Panel [NHANES]    Frequency of Communication with Friends and Family: More than three times a week    Frequency of Social Gatherings with Friends and Family: More than three times a week    Attends Religious Services: Never    Database administrator or Organizations: No    Attends Banker Meetings: Never    Marital Status: Married  Catering manager Violence: Not At Risk (11/21/2022)   Humiliation, Afraid, Rape, and Kick questionnaire    Fear of Current or Ex-Partner: No    Emotionally Abused: No    Physically Abused: No    Sexually Abused: No    Outpatient Medications Prior to Visit  Medication Sig Dispense Refill   amitriptyline (ELAVIL) 25 MG tablet Take 1 tablet (25 mg total) by mouth at bedtime. 90 tablet 3   Calcium Carbonate-Vit D-Min (CALTRATE 600+D PLUS) 600-400 MG-UNIT per tablet Take 2 tablets by mouth daily.     clobetasol cream (TEMOVATE) 0.05 % Apply 1 Application topically 2 (two) times daily. 30 g 0   clorazepate (TRANXENE) 3.75 MG tablet Take 1 tablet by mouth once daily 90 tablet 0   Cranberry (AZO CRANBERRY GUMMIES) 250 MG CHEW Chew 250 mg by mouth daily.     magic mouthwash (lidocaine, diphenhydrAMINE, alum & mag hydroxide) suspension Swish and spit 5 mLs 4 (four) times daily for 5 days. 100 mL 0   meclizine (ANTIVERT) 12.5 MG tablet Take 1 tablet (12.5 mg total) by mouth 3 (three) times daily as needed for dizziness. 30 tablet 0   Multiple Vitamins-Minerals (CENTRUM SILVER) CHEW Chew 1 tablet by mouth  daily.     nystatin (MYCOSTATIN) 100000 UNIT/ML suspension Take 5 mLs (500,000 Units total) by mouth 4 (four) times daily for 7 days. Swish in mouth for at least 60 seconds, and then spit out. Do not swallow. 140 mL 0   Omega-3 Fatty Acids (FISH OIL PO) Take 1 capsule by mouth 2 (two) times daily.     omeprazole (PRILOSEC) 20 MG capsule Take 1 capsule by mouth once daily 90 capsule 0   ondansetron (ZOFRAN) 4 MG tablet Take 1 tablet (4 mg total)  by mouth every 8 (eight) hours as needed for nausea or vomiting. 20 tablet 0   topiramate (TOPAMAX) 25 MG tablet Take 1 tablet by mouth twice daily 180 tablet 0   TURMERIC PO Take by mouth.     vitamin B-12 (CYANOCOBALAMIN) 1000 MCG tablet Take 1,000 mcg by mouth daily.     No facility-administered medications prior to visit.      ROS:  Review of Systems   OBJECTIVE:   Vitals:  BP (!) 155/89   Pulse 96   Wt 97 lb (44 kg)   BMI 18.33 kg/m   Physical Exam  Results: Results for orders placed or performed in visit on 02/20/23 (from the past 24 hour(s))  Sedimentation rate     Status: None   Collection Time: 02/20/23  9:08 AM  Result Value Ref Range   Sed Rate 12 0 - 30 mm/hr  CBC     Status: Abnormal   Collection Time: 02/20/23  9:08 AM  Result Value Ref Range   WBC 10.3 4.0 - 10.5 K/uL   RBC 4.93 3.87 - 5.11 Mil/uL   Platelets 235.0 150.0 - 400.0 K/uL   Hemoglobin 15.4 (H) 12.0 - 15.0 g/dL   HCT 16.1 (H) 09.6 - 04.5 %   MCV 94.0 78.0 - 100.0 fl   MCHC 33.2 30.0 - 36.0 g/dL   RDW 40.9 81.1 - 91.4 %     Assessment/Plan: 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    Dispense:  42.5 g    Refill:  12    Order Specific Question:   Supervising Provider    Answer:   Hildred Laser [[id number]]     Dominica Severin, CNM 02/20/2023 4:56  PM

## 2023-02-20 NOTE — Telephone Encounter (Signed)
Patient walked into office and had labs drawn

## 2023-02-21 LAB — ANA W/REFLEX: Anti Nuclear Antibody (ANA): NEGATIVE

## 2023-02-21 LAB — VITAMIN B12: Vitamin B-12: 1383 pg/mL — ABNORMAL HIGH (ref 200–1100)

## 2023-02-21 LAB — HIV ANTIBODY (ROUTINE TESTING W REFLEX): HIV 1&2 Ab, 4th Generation: NONREACTIVE

## 2023-02-22 ENCOUNTER — Telehealth: Payer: Self-pay

## 2023-02-22 ENCOUNTER — Encounter: Payer: Self-pay | Admitting: Family

## 2023-02-22 LAB — CERVICOVAGINAL ANCILLARY ONLY
Bacterial Vaginitis (gardnerella): NEGATIVE
Candida Glabrata: NEGATIVE
Candida Vaginitis: NEGATIVE
Comment: NEGATIVE
Comment: NEGATIVE
Comment: NEGATIVE

## 2023-02-22 LAB — URINE CULTURE: Organism ID, Bacteria: NO GROWTH

## 2023-02-22 NOTE — Telephone Encounter (Signed)
Pt called triage and is confused of instructions on how to apply Rx vag cream sent. She was advised to place it inside vagina, but she was advised by you to place it outside vagina. Your note states externally and she is aware. She said she will finish premarin sample you gave her and will wait for your response/clarification.

## 2023-03-01 NOTE — Telephone Encounter (Addendum)
Patient calling to f/u on previous message.  Advised per Shanda Bumps: She has a follow up scheduled with me next week, her blood pressure was too elevated to restart oral estradiol which is why we did the topical.

## 2023-03-01 NOTE — Telephone Encounter (Signed)
Spoke with patient. She states that she thought it was getting better, but last night when she used the cream, she woke up in the middle of the night and was burning on the outside, not the inside. She mentioned she was treated with an antibiotic by pcp for an infection and then all these symptoms started. She is inquiring about restarting Estradiol pills. She states she has already been tested for UTI and Yeast by PCP and our office and everything was negative/normal.

## 2023-03-01 NOTE — Telephone Encounter (Signed)
TRIAGE VOICEMAIL: Patient states she had a problem using the Estradiol Cream last night. She states that it burned her when she put it in last night. She would like to speak to [place] about this.

## 2023-03-10 ENCOUNTER — Ambulatory Visit: Payer: Medicare HMO | Admitting: Certified Nurse Midwife

## 2023-03-10 VITALS — BP 157/99 | HR 93 | Wt 98.7 lb

## 2023-03-10 DIAGNOSIS — N952 Postmenopausal atrophic vaginitis: Secondary | ICD-10-CM

## 2023-03-12 NOTE — Progress Notes (Signed)
Mort Sawyers, FNP   No chief complaint on file.   HPI:      Kristen Caldwell is a 77 y.o. No obstetric history on file. whose LMP was No LMP recorded. Patient has had a hysterectomy., presents today for follow up on atrophic vaginitis. Her symptoms have resolved since starting the topical estrogen cream, she would prefer oral estrogen instead.     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   Postural dizziness with near syncope 05/20/2020   CKD (chronic kidney disease) stage 3, GFR 30-59 ml/min (HCC) 01/07/2020   Chronic bilateral low back pain without sciatica 01/07/2020   Dupuytren's contracture of right hand 11/11/2019   Postmenopausal HRT (hormone replacement therapy) 05/23/2013   HLD (hyperlipidemia) 06/09/2008   Generalized anxiety disorder with panic attacks 02/21/2007   Depression 02/21/2007   Migraines 02/21/2007   GERD 02/21/2007   Osteoporosis 02/21/2007   CARPAL TUNNEL SYNDROME, HX OF 02/21/2007    Past Surgical History:  Procedure Laterality Date   ABDOMINAL HYSTERECTOMY  03/14/1989   partial   COLONOSCOPY     COLONOSCOPY WITH PROPOFOL N/A 12/14/2021   Procedure: COLONOSCOPY WITH PROPOFOL;  Surgeon: Toney Reil, MD;  Location: [facility];  Service: Gastroenterology;  Laterality: N/A;   TUBAL LIGATION      Family History  Problem Relation Age of Onset   Aneurysm Mother    Stroke Father    Hypertension Father    Lupus  Sister    Osteoporosis Sister    Hypertension Brother    Heart attack Brother    Breast cancer Paternal Aunt    Diabetes Paternal Grandfather    Colon cancer Neg Hx     Social History   Socioeconomic History   Marital status: Married    Spouse name: Chrissie Noa   Number of children: 2   Years of education: high school   Highest education level: 12th grade  Occupational History   Not on file  Tobacco Use   Smoking status: Never   Smokeless tobacco: Never  Vaping Use   Vaping status: Never Used  Substance and Sexual Activity   Alcohol use: No   Drug use: No   Sexual activity: Not Currently    Partners: Male  Other Topics Concern   Not on file  Social History Narrative   04/20/20   From: the area   Living: with son - Maurine Minister and husband is in long term care   Work: part-time 2 days a week, making window treatments      Family: Maurine Minister and Zella Ball - 2 grandchildren, and 1 adopted grandchildren       Enjoys: work is fun it is with friends      Exercise: walking, bending at work   Diet: tries to eat healthy      Safety   Seat belts: Yes    Guns: Yes  and secure   Safe in relationships: Yes  Social Drivers of Corporate investment banker Strain: Low Risk  (02/07/2023)   Overall Financial Resource Strain (CARDIA)    Difficulty of Paying Living Expenses: Not very hard  Food Insecurity: No Food Insecurity (02/07/2023)   Hunger Vital Sign    Worried About Running Out of Food in the Last Year: Never true    Ran Out of Food in the Last Year: Never true  Transportation Needs: No Transportation Needs (02/07/2023)   PRAPARE - Administrator, Civil Service (Medical): No    Lack of Transportation (Non-Medical): No  Physical Activity: Insufficiently Active (02/07/2023)   Exercise Vital Sign    Days of Exercise per Week: 2 days    Minutes of Exercise per Session: 20 min  Stress: No Stress Concern Present (02/07/2023)   [employer] of Occupational  Health - Occupational Stress Questionnaire    Feeling of Stress : Only a little  Social Connections: Unknown (02/07/2023)   Social Connection and Isolation Panel [NHANES]    Frequency of Communication with Friends and Family: Twice a week    Frequency of Social Gatherings with Friends and Family: Twice a week    Attends Religious Services: Patient declined    Database administrator or Organizations: No    Attends Engineer, structural: Never    Marital Status: Married  Recent Concern: Social Connections - Moderately Isolated (11/21/2022)   Social Connection and Isolation Panel [NHANES]    Frequency of Communication with Friends and Family: More than three times a week    Frequency of Social Gatherings with Friends and Family: More than three times a week    Attends Religious Services: Never    Database administrator or Organizations: No    Attends Banker Meetings: Never    Marital Status: Married  Catering manager Violence: Not At Risk (11/21/2022)   Humiliation, Afraid, Rape, and Kick questionnaire    Fear of Current or Ex-Partner: No    Emotionally Abused: No    Physically Abused: No    Sexually Abused: No    Outpatient Medications Prior to Visit  Medication Sig Dispense Refill   amitriptyline (ELAVIL) 25 MG tablet Take 1 tablet (25 mg total) by mouth at bedtime. 90 tablet 3   Calcium Carbonate-Vit D-Min (CALTRATE 600+D PLUS) 600-400 MG-UNIT per tablet Take 2 tablets by mouth daily.     clobetasol cream (TEMOVATE) 0.05 % Apply 1 Application topically 2 (two) times daily. 30 g 0   clorazepate (TRANXENE) 3.75 MG tablet Take 1 tablet by mouth once daily 90 tablet 0   Cranberry (AZO CRANBERRY GUMMIES) 250 MG CHEW Chew 250 mg by mouth daily.     estradiol (ESTRACE VAGINAL) 0.1 MG/GM vaginal cream Place 0.5 Applicatorfuls vaginally at bedtime. 42.5 g 12   meclizine (ANTIVERT) 12.5 MG tablet Take 1 tablet (12.5 mg total) by mouth 3 (three) times daily as needed for  dizziness. 30 tablet 0   Multiple Vitamins-Minerals (CENTRUM SILVER) CHEW Chew 1 tablet by mouth daily.     Omega-3 Fatty Acids (FISH OIL PO) Take 1 capsule by mouth 2 (two) times daily.     omeprazole (PRILOSEC) 20 MG capsule Take 1 capsule by mouth once daily 90 capsule 0   ondansetron (ZOFRAN) 4 MG tablet Take 1 tablet (4 mg total) by mouth every 8 (eight) hours as needed for nausea or vomiting. 20 tablet 0   topiramate (TOPAMAX) 25 MG tablet Take 1 tablet by mouth twice daily  180 tablet 0   TURMERIC PO Take by mouth.     vitamin B-12 (CYANOCOBALAMIN) 1000 MCG tablet Take 1,000 mcg by mouth daily.     No facility-administered medications prior to visit.      ROS:  Review of Systems  Constitutional: Negative.   Respiratory: Negative.    Cardiovascular: Negative.   Genitourinary: Negative.      OBJECTIVE:   Vitals:  BP (!) 157/99 (BP Location: Left Arm, Patient Position: Sitting, Cuff Size: Normal)   Pulse 93   Wt 98 lb 11.2 oz (44.8 kg)   BMI 18.65 kg/m   Physical Exam Vitals reviewed.  Constitutional:      Appearance: Normal appearance.  Cardiovascular:     Rate and Rhythm: Normal rate.  Pulmonary:     Effort: Pulmonary effort is normal.  Genitourinary:    Comments: Labia pale pink, tissue intact without excoriation or erythema. Previously seen irritation resolved at urethra. Neurological:     General: No focal deficit present.     Mental Status: She is alert and oriented to person, place, and time.  Psychiatric:        Mood and Affect: Mood normal.        Behavior: Behavior normal.     Results: No results found for this or any previous visit (from the past 24 hours).   Assessment/Plan: Postmenopausal atrophic vaginitis -Continue 2x/week topical estrogen therapy -Follow up with PCP for elevated blood pressure, again reviewed that oral estrogen is contraindicated with untreated hypertension though she would prefer oral therapy due to cost and ease of  taking.   No orders of the defined types were placed in this encounter.    Dominica Severin, CNM

## 2023-03-20 ENCOUNTER — Ambulatory Visit: Payer: Medicare HMO | Admitting: Family

## 2023-03-22 ENCOUNTER — Other Ambulatory Visit: Payer: Self-pay | Admitting: Family

## 2023-03-22 DIAGNOSIS — K219 Gastro-esophageal reflux disease without esophagitis: Secondary | ICD-10-CM

## 2023-03-23 ENCOUNTER — Institutional Professional Consult (permissible substitution) (INDEPENDENT_AMBULATORY_CARE_PROVIDER_SITE_OTHER): Payer: Medicare HMO | Admitting: Otolaryngology

## 2023-03-23 ENCOUNTER — Ambulatory Visit (INDEPENDENT_AMBULATORY_CARE_PROVIDER_SITE_OTHER): Payer: Medicare HMO | Admitting: Family

## 2023-03-23 ENCOUNTER — Telehealth: Payer: Self-pay | Admitting: Family

## 2023-03-23 VITALS — BP 132/82 | HR 80 | Temp 98.0°F | Ht 61.0 in | Wt 98.0 lb

## 2023-03-23 DIAGNOSIS — R030 Elevated blood-pressure reading, without diagnosis of hypertension: Secondary | ICD-10-CM | POA: Diagnosis not present

## 2023-03-23 DIAGNOSIS — N952 Postmenopausal atrophic vaginitis: Secondary | ICD-10-CM | POA: Diagnosis not present

## 2023-03-23 DIAGNOSIS — K146 Glossodynia: Secondary | ICD-10-CM | POA: Insufficient documentation

## 2023-03-23 NOTE — Telephone Encounter (Signed)
 Can we please send prior auth for clorazepate again for the new year? Was approved last year but expired end of December.

## 2023-03-23 NOTE — Assessment & Plan Note (Signed)
 Ongoing but with some improvement.  Did also decrease her b12, was in excess.

## 2023-03-23 NOTE — Assessment & Plan Note (Signed)
 Blood pressure normal limits suspect white coat vs at the time had pain which is now resolved.  Pending gynecology discretion, from a primary care perspective blood pressure normal range to proceed with oral estradiol.

## 2023-03-23 NOTE — Assessment & Plan Note (Signed)
 Pt to continue cream. Gynecology awaiting clearance for blood pressure control to consider pill.  Pt will reach out to gynecology to let her know blood pressure well controlled with AHBPM

## 2023-03-23 NOTE — Progress Notes (Signed)
 Established Patient Office Visit  Subjective:   Patient ID: Kristen Caldwell, female    DOB: 12/18/1945  Age: 78 y.o. MRN: [id number]  CC:  Chief Complaint  Patient presents with   Hypertension    HPI: Kristen Caldwell is a 78 y.o. female presenting on 03/23/2023 for Hypertension  Hypertension: at average blood pressure 133/60 or so.  Today 132/82.  Pt has elevated blood pressures only when she is seen in the office. She states her last visit with gynecology   Heartburn: she feels heartburn when she eats certain foods, she finds if she eats spicy foods it might bother her. Otherwise if she doesn't eat offending foods she is doing ok. She is taking omeprazole  every morning which helps as well.   Vaginal atrophy, she saw gynecology and was started on estradiol  cream. It is burning her a bit, and she does state that she tolerates oral therapy. Gynecology was awaiting f/u with me for blood pressure to make sure was stable and not elevated prior to consideration of starting oral pill.   Tongue still burning a bit with some improvement, thinks this happened after taking prednisone  but she isn't quite sure. She recently saw dentist who she states said that everything 'looked ok' in her mouth. Was given nystatin  without any relief.   B12, in excess: was taking 1000 mcg once daily now taking 500 mcg once daily.        ROS: Negative unless specifically indicated above in HPI.   Relevant past medical history reviewed and updated as indicated.   Allergies and medications reviewed and updated.   Current Outpatient Medications:    amitriptyline  (ELAVIL ) 25 MG tablet, Take 1 tablet (25 mg total) by mouth at bedtime., Disp: 90 tablet, Rfl: 3   Calcium Carbonate-Vit D-Min (CALTRATE 600+D PLUS) 600-400 MG-UNIT per tablet, Take 2 tablets by mouth daily., Disp: , Rfl:    clobetasol  cream (TEMOVATE ) 0.05 %, Apply 1 Application topically 2 (two) times daily., Disp: 30 g, Rfl:  0   clorazepate  (TRANXENE ) 3.75 MG tablet, Take 1 tablet by mouth once daily, Disp: 90 tablet, Rfl: 0   Cranberry (AZO CRANBERRY GUMMIES) 250 MG CHEW, Chew 250 mg by mouth daily., Disp: , Rfl:    estradiol  (ESTRACE  VAGINAL) 0.1 MG/GM vaginal cream, Place 0.5 Applicatorfuls vaginally at bedtime., Disp: 42.5 g, Rfl: 12   meclizine  (ANTIVERT ) 12.5 MG tablet, Take 1 tablet (12.5 mg total) by mouth 3 (three) times daily as needed for dizziness., Disp: 30 tablet, Rfl: 0   Multiple Vitamins-Minerals (CENTRUM SILVER) CHEW, Chew 1 tablet by mouth daily., Disp: , Rfl:    Omega-3 Fatty Acids (FISH OIL PO), Take 1 capsule by mouth 2 (two) times daily., Disp: , Rfl:    omeprazole  (PRILOSEC) 20 MG capsule, Take 1 capsule by mouth once daily, Disp: 90 capsule, Rfl: 0   ondansetron  (ZOFRAN ) 4 MG tablet, Take 1 tablet (4 mg total) by mouth every 8 (eight) hours as needed for nausea or vomiting., Disp: 20 tablet, Rfl: 0   topiramate  (TOPAMAX ) 25 MG tablet, Take 1 tablet by mouth twice daily, Disp: 180 tablet, Rfl: 0   TURMERIC PO, Take by mouth., Disp: , Rfl:    vitamin B-12 (CYANOCOBALAMIN ) 1000 MCG tablet, Take 1,000 mcg by mouth daily., Disp: , Rfl:   Allergies  Allergen Reactions   Cefuroxime  Nausea Only   Lagevrio  [Molnupiravir ] Nausea Only    Rash    Amoxicillin Nausea Only    REACTION:  NAUSEA   Cephalexin     REACTION: NAUSEA AND VOMITING   Promethazine Hcl Rash   Sertraline  Diarrhea   Sulfa  Antibiotics Nausea Only    Objective:   BP 132/82 (BP Location: Left Arm, Patient Position: Sitting, Cuff Size: Normal)   Pulse 80   Temp 98 F (36.7 C) (Temporal)   Ht 5' 1 (1.549 m)   Wt 98 lb (44.5 kg)   SpO2 98%   BMI 18.52 kg/m    Physical Exam Constitutional:      General: She is not in acute distress.    Appearance: Normal appearance. She is normal weight. She is not ill-appearing, toxic-appearing or diaphoretic.  HENT:     Head: Normocephalic.     Mouth/Throat:     Tongue: No lesions.   Cardiovascular:     Rate and Rhythm: Normal rate and regular rhythm.  Pulmonary:     Effort: Pulmonary effort is normal.     Breath sounds: Normal breath sounds.  Musculoskeletal:        General: Normal range of motion.  Neurological:     General: No focal deficit present.     Mental Status: She is alert and oriented to person, place, and time. Mental status is at baseline.  Psychiatric:        Mood and Affect: Mood normal.        Behavior: Behavior normal.        Thought Content: Thought content normal.        Judgment: Judgment normal.     Assessment & Plan:  Elevated blood pressure reading in office with white coat syndrome, without diagnosis of hypertension Assessment & Plan: Blood pressure normal limits suspect white coat vs at the time had pain which is now resolved.  Pending gynecology discretion, from a primary care perspective blood pressure normal range to proceed with oral estradiol .     Burning tongue Assessment & Plan: Ongoing but with some improvement.  Did also decrease her b12, was in excess.    Vaginal atrophy Assessment & Plan: Pt to continue cream. Gynecology awaiting clearance for blood pressure control to consider pill.  Pt will reach out to gynecology to let her know blood pressure well controlled with AHBPM      Follow up plan: Return in about 6 months (around 09/20/2023) for f/u anxiety.  Ginger Patrick, FNP

## 2023-03-27 ENCOUNTER — Other Ambulatory Visit: Payer: Self-pay | Admitting: Certified Nurse Midwife

## 2023-03-27 ENCOUNTER — Other Ambulatory Visit (HOSPITAL_COMMUNITY): Payer: Self-pay

## 2023-03-27 MED ORDER — ESTRADIOL 0.5 MG PO TABS: 0.2500 mg | ORAL_TABLET | Freq: Every day | ORAL | 3 refills | Status: DC

## 2023-03-27 MED ORDER — ESTRADIOL 0.5 MG PO TABS: 0.5000 mg | ORAL_TABLET | Freq: Every day | ORAL | 1 refills | Status: DC

## 2023-03-27 NOTE — Progress Notes (Signed)
 Prescription provided to restart HRT orally per patient preference will switch from vaginal to oral therapy.

## 2023-03-29 ENCOUNTER — Other Ambulatory Visit (HOSPITAL_COMMUNITY): Payer: Self-pay

## 2023-03-30 ENCOUNTER — Other Ambulatory Visit (HOSPITAL_COMMUNITY): Payer: Self-pay

## 2023-03-30 ENCOUNTER — Telehealth: Payer: Self-pay

## 2023-03-30 ENCOUNTER — Encounter: Payer: Self-pay | Admitting: Family

## 2023-03-30 DIAGNOSIS — K121 Other forms of stomatitis: Secondary | ICD-10-CM | POA: Diagnosis not present

## 2023-03-30 DIAGNOSIS — B379 Candidiasis, unspecified: Secondary | ICD-10-CM | POA: Diagnosis not present

## 2023-03-30 NOTE — Telephone Encounter (Signed)
Per test claim: Refill too soon. PA is not needed at this time. Medication was filled 02/07/2023. Next eligible fill date is 04/16/2023.  Attempted renewal PA in CMM, and PA was cancelled due to following message from insurance:

## 2023-04-02 ENCOUNTER — Other Ambulatory Visit: Payer: Self-pay | Admitting: Family

## 2023-04-02 DIAGNOSIS — G8929 Other chronic pain: Secondary | ICD-10-CM

## 2023-04-07 ENCOUNTER — Other Ambulatory Visit (HOSPITAL_COMMUNITY): Payer: Self-pay

## 2023-04-11 ENCOUNTER — Other Ambulatory Visit (HOSPITAL_COMMUNITY): Payer: Self-pay

## 2023-04-11 ENCOUNTER — Telehealth: Payer: Self-pay

## 2023-04-11 NOTE — Telephone Encounter (Signed)
The tier exception for Clorazepate 3.75 mg tablets has been approved until 03/13/24. Unable to do a test claim as it is too soon. Last fill date was 02/07/23 for a 90 day supply. The next fill date is 05/06/23.

## 2023-04-30 ENCOUNTER — Other Ambulatory Visit: Payer: Self-pay | Admitting: Family

## 2023-04-30 DIAGNOSIS — F411 Generalized anxiety disorder: Secondary | ICD-10-CM

## 2023-05-01 DIAGNOSIS — E785 Hyperlipidemia, unspecified: Secondary | ICD-10-CM | POA: Diagnosis not present

## 2023-05-01 DIAGNOSIS — H269 Unspecified cataract: Secondary | ICD-10-CM | POA: Diagnosis not present

## 2023-05-01 DIAGNOSIS — H547 Unspecified visual loss: Secondary | ICD-10-CM | POA: Diagnosis not present

## 2023-05-01 DIAGNOSIS — M3500 Sicca syndrome, unspecified: Secondary | ICD-10-CM | POA: Diagnosis not present

## 2023-05-01 DIAGNOSIS — N182 Chronic kidney disease, stage 2 (mild): Secondary | ICD-10-CM | POA: Diagnosis not present

## 2023-05-01 DIAGNOSIS — K219 Gastro-esophageal reflux disease without esophagitis: Secondary | ICD-10-CM | POA: Diagnosis not present

## 2023-05-01 DIAGNOSIS — G43909 Migraine, unspecified, not intractable, without status migrainosus: Secondary | ICD-10-CM | POA: Diagnosis not present

## 2023-05-01 DIAGNOSIS — Z8249 Family history of ischemic heart disease and other diseases of the circulatory system: Secondary | ICD-10-CM | POA: Diagnosis not present

## 2023-05-01 DIAGNOSIS — M1990 Unspecified osteoarthritis, unspecified site: Secondary | ICD-10-CM | POA: Diagnosis not present

## 2023-05-01 DIAGNOSIS — F419 Anxiety disorder, unspecified: Secondary | ICD-10-CM | POA: Diagnosis not present

## 2023-05-01 DIAGNOSIS — F324 Major depressive disorder, single episode, in partial remission: Secondary | ICD-10-CM | POA: Diagnosis not present

## 2023-05-01 DIAGNOSIS — K5900 Constipation, unspecified: Secondary | ICD-10-CM | POA: Diagnosis not present

## 2023-06-20 ENCOUNTER — Other Ambulatory Visit: Payer: Self-pay | Admitting: Family

## 2023-06-20 DIAGNOSIS — K219 Gastro-esophageal reflux disease without esophagitis: Secondary | ICD-10-CM

## 2023-06-22 ENCOUNTER — Other Ambulatory Visit: Payer: Self-pay | Admitting: Family

## 2023-06-22 DIAGNOSIS — K219 Gastro-esophageal reflux disease without esophagitis: Secondary | ICD-10-CM

## 2023-07-01 ENCOUNTER — Other Ambulatory Visit: Payer: Self-pay | Admitting: Family

## 2023-07-01 DIAGNOSIS — G43819 Other migraine, intractable, without status migrainosus: Secondary | ICD-10-CM

## 2023-08-01 ENCOUNTER — Encounter (INDEPENDENT_AMBULATORY_CARE_PROVIDER_SITE_OTHER): Payer: Self-pay

## 2023-08-01 ENCOUNTER — Other Ambulatory Visit: Payer: Self-pay | Admitting: Family

## 2023-08-01 DIAGNOSIS — F410 Panic disorder [episodic paroxysmal anxiety] without agoraphobia: Secondary | ICD-10-CM

## 2023-08-03 ENCOUNTER — Ambulatory Visit: Payer: Self-pay

## 2023-08-03 NOTE — Telephone Encounter (Signed)
 One curtsey call made to update pt. Had to leave voicemail. PCP already sent in new script and pharmacy received it th is morning at 0713.        Copied from CRM [id number]. Topic: Clinical - Prescription Issue >> Aug 03, 2023 10:03 AM Kristen Caldwell wrote: Reason for CRM: Patient called to check status of her medication refill for the clorazepate  (TRANXENE ) 3.75 MG tablet, medication shows discontinued by provider on 5/22, patient only has 4 pills and due to refill on 5/20.  Kristen Caldwell [phone] Reason for Disposition  Health Information question, no triage required and triager able to answer question  Answer Assessment - Initial Assessment Questions 1. REASON FOR CALL or QUESTION: "What is your reason for calling today?" or "How can I best help you?" or "What question do you have that I can help answer?"     Pt wanted update on med refill  Protocols used: Information Only Call - No Triage-A-AH

## 2023-08-17 ENCOUNTER — Telehealth: Payer: Self-pay | Admitting: Family

## 2023-08-17 NOTE — Telephone Encounter (Signed)
 Spoke with Kristen Caldwell. She is aware that the order for her bone density has been faxed to [facility] at [phone].

## 2023-08-17 NOTE — Telephone Encounter (Signed)
 Copied from CRM [id number]. Topic: Referral - Request for Referral >> Aug 17, 2023  9:37 AM Kita Perish H wrote: Did the patient discuss referral with their provider in the last year? Yes (If No - schedule appointment) (If Yes - send message)  Appointment offered? No  Type of order/referral and detailed reason for visit: Bone Density test  Preference of office, provider, location: [facility] appointment is 8/4  If referral order, have you been seen by this specialty before? Yes (If Yes, this issue or another issue? When? Where? 2 years ago  Can we respond through MyChart? Yes

## 2023-09-28 ENCOUNTER — Other Ambulatory Visit: Payer: Self-pay | Admitting: Family

## 2023-09-28 DIAGNOSIS — G43819 Other migraine, intractable, without status migrainosus: Secondary | ICD-10-CM

## 2023-10-16 LAB — HM DEXA SCAN

## 2023-10-16 LAB — HM MAMMOGRAPHY

## 2023-10-18 ENCOUNTER — Ambulatory Visit: Payer: Self-pay

## 2023-10-18 NOTE — Telephone Encounter (Signed)
  FYI Only or Action Required?: FYI only for provider.  Patient was last seen in primary care on 03/23/2023 by Kristen Antu, FNP.  Called Nurse Triage reporting Facial Pain.  Symptoms began last Thursday .  Interventions attempted: OTC medications: Mucinex sinus, heat application.  Symptoms are: unchanged.  Triage Disposition: See Physician Within 24 Hours  Patient/caregiver understands and will follow disposition?:              Reason for Disposition  [1] Sinus pain (not just congestion) AND [2] fever  Answer Assessment - Initial Assessment Questions 1. ONSET: When did the pain start? (e.g., minutes, hours, days)     Sat 2. ONSET: Does the pain come and go, or has it been constant since it started? (e.g., constant, intermittent, fleeting)    Constant  3. SEVERITY: How bad is the pain? (Scale 1-10; mild, moderate or severe)     4/10 4. LOCATION: Where does it hurt?      Forehead back of neck popping ear noises 5. RASH: Is there any redness, rash, or swelling of your face?     no 6. FEVER: Do you have a fever? If Yes, ask: What is it, how was it measured, and when did it start?      no 7. OTHER SYMPTOMS: Do you have any other symptoms? (e.g., fever, toothache, nasal discharge, nasal congestion, clicking sensation in jaw joint)     Body aches, loss of appetite, runny nose, cough, constant headache, top of cheeks  Answer Assessment - Initial Assessment Questions 1. LOCATION: Where does it hurt?      Forehead /back of neck  2. ONSET: When did the sinus pain start?  (e.g., hours, days)      Last Thursday  3. SEVERITY: How bad is the pain?   (Scale 0-10; or none, mild, moderate or severe)     4/10 5. NASAL CONGESTION: Is the nose blocked? If Yes, ask: Can you open it or must you breathe through your mouth?     no 6. NASAL DISCHARGE: Do you have discharge from your nose? If so ask, What color?     no 7. FEVER: Do you have a fever? If  Yes, ask: What is it, how was it measured, and when did it start?      no 8. OTHER SYMPTOMS: Do you have any other symptoms? (e.g., sore throat, cough, earache, difficulty breathing)     Forehead pain  Protocols used: Face Pain-A-AH, Sinus Pain or Congestion-A-AH

## 2023-10-18 NOTE — Telephone Encounter (Signed)
 NOTED

## 2023-10-19 ENCOUNTER — Telehealth: Payer: Self-pay

## 2023-10-19 ENCOUNTER — Ambulatory Visit (INDEPENDENT_AMBULATORY_CARE_PROVIDER_SITE_OTHER): Admitting: Family

## 2023-10-19 VITALS — BP 122/86 | HR 84 | Temp 98.1°F | Ht 61.0 in | Wt 97.0 lb

## 2023-10-19 DIAGNOSIS — J0100 Acute maxillary sinusitis, unspecified: Secondary | ICD-10-CM

## 2023-10-19 DIAGNOSIS — G4484 Primary exertional headache: Secondary | ICD-10-CM | POA: Diagnosis not present

## 2023-10-19 DIAGNOSIS — J301 Allergic rhinitis due to pollen: Secondary | ICD-10-CM | POA: Diagnosis not present

## 2023-10-19 DIAGNOSIS — B3731 Acute candidiasis of vulva and vagina: Secondary | ICD-10-CM | POA: Diagnosis not present

## 2023-10-19 MED ORDER — DOXYCYCLINE HYCLATE 100 MG PO TABS: 100.0000 mg | ORAL_TABLET | Freq: Two times a day (BID) | ORAL | 0 refills | Status: DC

## 2023-10-19 MED ORDER — FLUTICASONE PROPIONATE 50 MCG/ACT NA SUSP: 2.0000 | Freq: Every day | NASAL | Status: DC

## 2023-10-19 MED ORDER — LORATADINE 10 MG PO TABS: 10.0000 mg | ORAL_TABLET | Freq: Every day | ORAL | Status: AC

## 2023-10-19 MED ORDER — FLUCONAZOLE 150 MG PO TABS: 150.0000 mg | ORAL_TABLET | Freq: Once | ORAL | 0 refills | Status: AC

## 2023-10-19 MED ORDER — FLUTICASONE PROPIONATE 50 MCG/ACT NA SUSP: 2.0000 | Freq: Every day | NASAL | 1 refills | Status: AC

## 2023-10-19 NOTE — Progress Notes (Signed)
 Established Patient Office Visit  Subjective:      CC:  Chief Complaint  Patient presents with   Acute Visit    Reports headache and ears popping x1 week.    HPI: Kristen Caldwell is a 78 y.o. female presenting on 10/19/2023 for Acute Visit (Reports headache and ears popping x1 week.)   Discussed the use of AI scribe software for clinical note transcription with the patient, who gave verbal consent to proceed.  History of Present Illness Kristen Caldwell is a 78 year old female who presents with persistent headaches and sinus pressure after outdoor exposure.  They experience persistent headaches and sinus pressure after spending time outdoors. The headaches typically begin the day after outdoor exposure and are accompanied by pain across the forehead, back of the neck, and popping sensations in the ears. The headaches persist throughout the day and night, affecting their sleep as they wake up with a throbbing head and have difficulty returning to sleep due to persistent thoughts.  They have a history of similar symptoms when exposed to outdoor environments. They have been taking Zyrtec, half a tablet in the morning, to manage these symptoms but report that it keeps them awake. They have also tried Claritin  in the past. They occasionally use Mucinex Sinus but avoid regular use due to its stimulating effects.  They describe a sensation of ear popping without pain and no significant nasal drainage, although they experience a burning sensation in the nose. They have a history of a burning tongue and altered taste, which they attribute to a past prednisone  treatment. They have been using a specific toothpaste recommended by their dentist to manage this issue.  They use peppermint bean bags for relief of sinus pressure and headaches, which they apply to their neck and head. They also use Tylenol for pain management.  They continue to work and visit their husband in  [place], which helps them stay active and avoid feeling cooped up at home.         Social history:  Relevant past medical, surgical, family and social history reviewed and updated as indicated. Interim medical history since our last visit reviewed.  Allergies and medications reviewed and updated.  DATA REVIEWED: CHART IN EPIC     ROS: Negative unless specifically indicated above in HPI.    Current Outpatient Medications:    amitriptyline  (ELAVIL ) 25 MG tablet, TAKE 1 TABLET BY MOUTH AT BEDTIME, Disp: 90 tablet, Rfl: 1   Calcium Carbonate-Vit D-Min (CALTRATE 600+D PLUS) 600-400 MG-UNIT per tablet, Take 2 tablets by mouth daily., Disp: , Rfl:    clobetasol  cream (TEMOVATE ) 0.05 %, Apply 1 Application topically 2 (two) times daily., Disp: 30 g, Rfl: 0   clorazepate  (TRANXENE ) 3.75 MG tablet, Take 1 tablet by mouth once daily, Disp: 90 tablet, Rfl: 0   Cranberry (AZO CRANBERRY GUMMIES) 250 MG CHEW, Chew 250 mg by mouth daily., Disp: , Rfl:    doxycycline  (VIBRA -TABS) 100 MG tablet, Take 1 tablet (100 mg total) by mouth 2 (two) times daily for 10 days., Disp: 20 tablet, Rfl: 0   estradiol  (ESTRACE ) 0.5 MG tablet, Take 1 tablet (0.5 mg total) by mouth daily., Disp: 90 tablet, Rfl: 1   fluconazole  (DIFLUCAN ) 150 MG tablet, Take 1 tablet (150 mg total) by mouth once for 1 dose., Disp: 1 tablet, Rfl: 0   loratadine  (CLARITIN ) 10 MG tablet, Take 1 tablet (10 mg total) by mouth daily., Disp: , Rfl:  meclizine  (ANTIVERT ) 12.5 MG tablet, Take 1 tablet (12.5 mg total) by mouth 3 (three) times daily as needed for dizziness., Disp: 30 tablet, Rfl: 0   Multiple Vitamins-Minerals (CENTRUM SILVER) CHEW, Chew 1 tablet by mouth daily., Disp: , Rfl:    Omega-3 Fatty Acids (FISH OIL PO), Take 1 capsule by mouth 2 (two) times daily., Disp: , Rfl:    omeprazole  (PRILOSEC) 20 MG capsule, Take 1 capsule by mouth once daily, Disp: 90 capsule, Rfl: 0   ondansetron  (ZOFRAN ) 4 MG tablet, Take 1 tablet  (4 mg total) by mouth every 8 (eight) hours as needed for nausea or vomiting., Disp: 20 tablet, Rfl: 0   Saccharomyces boulardii (PROBIOTIC) 250 MG CAPS, Take 1 capsule by mouth daily., Disp: , Rfl:    topiramate  (TOPAMAX ) 25 MG tablet, Take 1 tablet by mouth twice daily, Disp: 180 tablet, Rfl: 3   TURMERIC PO, Take by mouth., Disp: , Rfl:    vitamin B-12 (CYANOCOBALAMIN ) 1000 MCG tablet, Take 1,000 mcg by mouth daily., Disp: , Rfl:    fluticasone  (FLONASE ) 50 MCG/ACT nasal spray, Place 2 sprays into both nostrils daily., Disp: 16 g, Rfl: 1        Objective:        BP 122/86 (BP Location: Left Arm, Patient Position: Sitting, Cuff Size: Normal)   Pulse 84   Temp 98.1 F (36.7 C) (Temporal)   Ht 5' 1 (1.549 m)   Wt 97 lb (44 kg)   SpO2 98%   BMI 18.33 kg/m   Physical Exam HEENT: Fluid in right ear, no infection. Fluid in left ear behind tympanic membrane, clear. Cobblestoning of the posterior pharynx. White coating on tongue. Nasal examination normal.sinus tenderness maxillary and frontal sinus  NECK: Occipital lymphadenopathy. CHEST: Lungs clear to auscultation bilaterally. CARDIOVASCULAR: Heart regular rate and rhythm.  Wt Readings from Last 3 Encounters:  10/19/23 97 lb (44 kg)  03/23/23 98 lb (44.5 kg)  03/10/23 98 lb 11.2 oz (44.8 kg)          Results   Assessment & Plan:   Assessment and Plan Assessment & Plan Allergic rhinitis Chronic allergic rhinitis exacerbated by outdoor exposure, presenting with headaches, ear popping, and cobblestoning of the throat. Current Zyrtec treatment is ineffective, possibly due to tolerance. No nasal congestion or rhinorrhea. Differential includes sinusitis due to overlapping symptoms. - Switch from Zyrtec to Claritin  or Allegra. - Start Flonase  nasal spray, use regularly for two weeks. - Avoid Mucinex as it contains antihistamines. - Use Tylenol for headache relief. - Apply warm compresses to the sinuses for drainage  relief. - Consider over-the-counter Flonase  or generic fluticasone  if insurance does not cover it.  Acute sinusitis with allergic rhinitis Acute sinusitis likely secondary to allergic rhinitis, presenting with facial tenderness, sinus pressure, and persistent headaches. Examination reveals fluid behind tympanic membranes and tenderness in occipital lymph nodes. Symptoms suggestive of sinusitis warrant antibiotic treatment. - Prescribe doxycycline  100 mg orally twice daily for 10 days. - Prescribe Diflucan  150 mg orally once if needed for yeast infection. - Encourage use of peppermint bean bags for neck and head relief.  Recording duration: 11 minutes      Return in about 3 months (around 01/19/2024), or if symptoms worsen or fail to improve, for f/u CPE.     Ginger Patrick, MSN, APRN, FNP-C [facility]

## 2023-10-19 NOTE — Telephone Encounter (Signed)
 Copied from CRM [id number]. Topic: Clinical - Prescription Issue >> Oct 19, 2023 12:35 PM Donna BRAVO wrote: Reason for CRM: patient was seen by Ginger Patrick FNP today. Medication doxycycline  (VIBRA -TABS) 100 MG tablet was sent to pharmacy. This medication makes patient nauseated and sick. Patient would like something else prescribed.   Patient pharmacy  [facility] [address], [place] - [address] [place] [address] Phone: [phone]  Fax: [phone]  Patient would like a call back regarding this issue.   Patient was made aware their call will be returned in 1 business day.

## 2023-10-19 NOTE — Telephone Encounter (Addendum)
 Spoke with pt. States that she has not started the medication yet, she hasn't even picked it up. She says that she has taken this medication in the past and it has caused nausea. Pt would like to have something else sent in.

## 2023-10-20 MED ORDER — AZITHROMYCIN 250 MG PO TABS: ORAL_TABLET | ORAL | 0 refills | Status: AC

## 2023-10-20 NOTE — Telephone Encounter (Signed)
 Please advise I have sent in zpack to patient instead of doxycyline however I reviewed the chart and there is no note there was an issue with doxycycline  in the past. This in fact has been given to her multiple times as she is has allergies to so many other antibiotics and per note said always tolerated well.   Zpack may not help with this particular infection, but per pt request antbx has been changed (please let her know all of this information I put in here so she is aware her options are very limited)

## 2023-10-20 NOTE — Addendum Note (Signed)
 Addended by: CORWIN ANTU on: 10/20/2023 07:28 AM   Modules accepted: Orders

## 2023-10-20 NOTE — Telephone Encounter (Signed)
 Spoke with pt and she is aware of Tabitha's response. Nothing further was needed.

## 2023-10-30 ENCOUNTER — Other Ambulatory Visit: Payer: Self-pay | Admitting: Family

## 2023-10-30 DIAGNOSIS — F410 Panic disorder [episodic paroxysmal anxiety] without agoraphobia: Secondary | ICD-10-CM

## 2023-11-01 ENCOUNTER — Other Ambulatory Visit: Payer: Self-pay | Admitting: Family

## 2023-11-01 DIAGNOSIS — F411 Generalized anxiety disorder: Secondary | ICD-10-CM

## 2023-11-04 ENCOUNTER — Other Ambulatory Visit: Payer: Self-pay | Admitting: Family

## 2023-11-04 DIAGNOSIS — F410 Panic disorder [episodic paroxysmal anxiety] without agoraphobia: Secondary | ICD-10-CM

## 2023-11-06 ENCOUNTER — Encounter: Payer: Self-pay | Admitting: Family

## 2023-11-06 DIAGNOSIS — F410 Panic disorder [episodic paroxysmal anxiety] without agoraphobia: Secondary | ICD-10-CM

## 2023-11-06 MED ORDER — CLORAZEPATE DIPOTASSIUM 3.75 MG PO TABS: 3.7500 mg | ORAL_TABLET | Freq: Every day | ORAL | 0 refills | Status: DC

## 2023-11-06 NOTE — Telephone Encounter (Signed)
 Copied from CRM #[id number]. Topic: Clinical - Prescription Issue >> Nov 03, 2023  1:25 PM Suzen RAMAN wrote: Reason for CRM: inquiry about status of medication(clorazepate  (TRANXENE ) 3.75 MG tablet) refill request. Medication Advised patient per CAL that the pharmacy will reach out to the patient when medication is available for pick up. >> Nov 06, 2023  8:15 AM Emylou G wrote: Adv Patient of turn around time.. She really wants a call back.. said been waiting since Wed

## 2023-11-29 ENCOUNTER — Ambulatory Visit (INDEPENDENT_AMBULATORY_CARE_PROVIDER_SITE_OTHER)

## 2023-11-29 ENCOUNTER — Ambulatory Visit (INDEPENDENT_AMBULATORY_CARE_PROVIDER_SITE_OTHER): Payer: Medicare HMO

## 2023-11-29 VITALS — Ht <= 58 in | Wt 97.0 lb

## 2023-11-29 DIAGNOSIS — Z0000 Encounter for general adult medical examination without abnormal findings: Secondary | ICD-10-CM

## 2023-11-29 DIAGNOSIS — Z23 Encounter for immunization: Secondary | ICD-10-CM

## 2023-11-29 NOTE — Progress Notes (Signed)
 Per orders of Ginger Patrick, NP, injection of fluzone high dose over 65 given by Laray Arenas in right deltoid. Patient tolerated injection well.

## 2023-11-29 NOTE — Patient Instructions (Signed)
 Kristen Caldwell,  Thank you for taking the time for your Medicare Wellness Visit. I appreciate your continued commitment to your health goals. Please review the care plan we discussed, and feel free to reach out if I can assist you further.  Medicare recommends these wellness visits once per year to help you and your care team stay ahead of potential health issues. These visits are designed to focus on prevention, allowing your provider to concentrate on managing your acute and chronic conditions during your regular appointments.  Please note that Annual Wellness Visits do not include a physical exam. Some assessments may be limited, especially if the visit was conducted virtually. If needed, we may recommend a separate in-person follow-up with your provider.  Ongoing Care Seeing your primary care provider every 3 to 6 months helps us  monitor your health and provide consistent, personalized care.   Referrals If a referral was made during today's visit and you haven't received any updates within two weeks, please contact the referred provider directly to check on the status.  Recommended Screenings:  Health Maintenance  Topic Date Due   Breast Cancer Screening  09/12/2023   COVID-19 Vaccine (4 - 2025-26 season) 11/13/2023   Flu Shot  06/11/2024*   Medicare Annual Wellness Visit  11/28/2024   Colon Cancer Screening  12/14/2024   DTaP/Tdap/Td vaccine (3 - Td or Tdap) 07/02/2026   Pneumococcal Vaccine for age over 40  Completed   DEXA scan (bone density measurement)  Completed   Hepatitis C Screening  Completed   Zoster (Shingles) Vaccine  Completed   HPV Vaccine  Aged Out   Meningitis B Vaccine  Aged Out  *Topic was postponed. The date shown is not the original due date.       11/21/2022    8:27 AM  Advanced Directives  Does Patient Have a Medical Advance Directive? No  Would patient like information on creating a medical advance directive? No - Patient declined   Advance Care Planning  is important because it: Ensures you receive medical care that aligns with your values, goals, and preferences. Provides guidance to your family and loved ones, reducing the emotional burden of decision-making during critical moments.  Vision: Annual vision screenings are recommended for early detection of glaucoma, cataracts, and diabetic retinopathy. These exams can also reveal signs of chronic conditions such as diabetes and high blood pressure.  Dental: Annual dental screenings help detect early signs of oral cancer, gum disease, and other conditions linked to overall health, including heart disease and diabetes.

## 2023-11-29 NOTE — Progress Notes (Signed)
 Subjective:   Kristen Caldwell is a 78 y.o. who presents for a Medicare Wellness preventive visit.  As a reminder, Annual Wellness Visits don't include a physical exam, and some assessments may be limited, especially if this visit is performed virtually. We may recommend an in-person follow-up visit with your provider if needed.  Visit Complete: Virtual I connected with  Kristen Caldwell on 11/29/23 by a audio enabled telemedicine application and verified that I am speaking with the correct person using two identifiers.  Patient Location: Home  Provider Location: Office/Clinic  I discussed the limitations of evaluation and management by telemedicine. The patient expressed understanding and agreed to proceed.  Vital Signs: Because this visit was a virtual/telehealth visit, some criteria may be missing or patient reported. Any vitals not documented were not able to be obtained and vitals that have been documented are patient reported.  VideoDeclined- This patient declined Librarian, academic. Therefore the visit was completed with audio only.  Persons Participating in Visit: Patient.  AWV Questionnaire: Yes: Patient Medicare AWV questionnaire was completed by the patient on 11/29/23; I have confirmed that all information answered by patient is correct and no changes since this date.  Cardiac Risk Factors include: dyslipidemia;advanced age (>29men, >75 women)     Objective:    Today's Vitals   11/29/23 0852  Weight: 97 lb (44 kg)  Height: 4' 9 (1.448 m)   Body mass index is 20.99 kg/m.     11/29/2023    9:03 AM 11/21/2022    8:27 AM 12/14/2021    9:49 AM 11/16/2021    8:56 AM 11/12/2020    2:15 PM 05/12/2020    9:26 AM 11/08/2019    3:37 PM  Advanced Directives  Does Patient Have a Medical Advance Directive? Yes No No No No No No  Type of Estate agent of [place];Living will        Copy of Healthcare Power of  Attorney in Chart? No - copy requested        Would patient like information on creating a medical advance directive?  No - Patient declined  No - Patient declined Yes (MAU/Ambulatory/Procedural Areas - Information given)  No - Patient declined    Current Medications (verified) Outpatient Encounter Medications as of 11/29/2023  Medication Sig   amitriptyline  (ELAVIL ) 25 MG tablet TAKE 1 TABLET BY MOUTH AT BEDTIME   Calcium Carbonate-Vit D-Min (CALTRATE 600+D PLUS) 600-400 MG-UNIT per tablet Take 2 tablets by mouth daily.   clobetasol  cream (TEMOVATE ) 0.05 % Apply 1 Application topically 2 (two) times daily.   clorazepate  (TRANXENE ) 3.75 MG tablet Take 1 tablet (3.75 mg total) by mouth daily.   Cranberry (AZO CRANBERRY GUMMIES) 250 MG CHEW Chew 250 mg by mouth daily.   estradiol  (ESTRACE ) 0.5 MG tablet Take 1 tablet (0.5 mg total) by mouth daily.   fluticasone  (FLONASE ) 50 MCG/ACT nasal spray Place 2 sprays into both nostrils daily.   loratadine  (CLARITIN ) 10 MG tablet Take 1 tablet (10 mg total) by mouth daily.   meclizine  (ANTIVERT ) 12.5 MG tablet Take 1 tablet (12.5 mg total) by mouth 3 (three) times daily as needed for dizziness.   Multiple Vitamins-Minerals (CENTRUM SILVER) CHEW Chew 1 tablet by mouth daily.   Omega-3 Fatty Acids (FISH OIL PO) Take 1 capsule by mouth 2 (two) times daily.   omeprazole  (PRILOSEC) 20 MG capsule Take 1 capsule by mouth once daily   ondansetron  (ZOFRAN ) 4 MG tablet  Take 1 tablet (4 mg total) by mouth every 8 (eight) hours as needed for nausea or vomiting.   Saccharomyces boulardii (PROBIOTIC) 250 MG CAPS Take 1 capsule by mouth daily.   topiramate  (TOPAMAX ) 25 MG tablet Take 1 tablet by mouth twice daily   TURMERIC PO Take by mouth.   vitamin B-12 (CYANOCOBALAMIN ) 1000 MCG tablet Take 1,000 mcg by mouth daily.   No facility-administered encounter medications on file as of 11/29/2023.    Allergies (verified) Cefuroxime , Doxycycline , Lagevrio  [molnupiravir ],  Amoxicillin, Cephalexin, Promethazine hcl, Sertraline , and Sulfa  antibiotics   History: Past Medical History:  Diagnosis Date   Anxiety    Chronic headaches    COVID 03/03/2020   Depression    Diverticulosis    GERD (gastroesophageal reflux disease)    Osteoporosis    Past Surgical History:  Procedure Laterality Date   ABDOMINAL HYSTERECTOMY  03/14/1989   partial   COLONOSCOPY     COLONOSCOPY WITH PROPOFOL  N/A 12/14/2021   Procedure: COLONOSCOPY WITH PROPOFOL ;  Surgeon: Unk Corinn Skiff, MD;  Location: [facility] ENDOSCOPY;  Service: Gastroenterology;  Laterality: N/A;   TUBAL LIGATION     Family History  Problem Relation Age of Onset   Aneurysm Mother    Stroke Father    Hypertension Father    Lupus Sister    Osteoporosis Sister    Hypertension Brother    Heart attack Brother    Breast cancer Paternal Aunt    Diabetes Paternal Grandfather    Colon cancer Neg Hx    Social History   Socioeconomic History   Marital status: Married    Spouse name: Elsie   Number of children: 2   Years of education: high school   Highest education level: 12th grade  Occupational History   Not on file  Tobacco Use   Smoking status: Never   Smokeless tobacco: Never  Vaping Use   Vaping status: Never Used  Substance and Sexual Activity   Alcohol use: No   Drug use: No   Sexual activity: Not Currently    Partners: Male  Other Topics Concern   Not on file  Social History Narrative   04/20/20   From: the area   Living: with son - Marinda and husband is in long term care   Work: part-time 2 days a week, making window treatments      Family: Marinda and Grayce - 2 grandchildren, and 1 adopted grandchildren       Enjoys: work is fun it is with friends      Exercise: walking, bending at work   Diet: tries to eat healthy      Safety   Seat belts: Yes    Guns: Yes  and secure   Safe in relationships: Yes             Social Drivers of Corporate investment banker Strain: Low  Risk  (11/29/2023)   Overall Financial Resource Strain (CARDIA)    Difficulty of Paying Living Expenses: Not hard at all  Food Insecurity: No Food Insecurity (11/29/2023)   Hunger Vital Sign    Worried About Running Out of Food in the Last Year: Never true    Ran Out of Food in the Last Year: Never true  Transportation Needs: No Transportation Needs (11/29/2023)   PRAPARE - Administrator, Civil Service (Medical): No    Lack of Transportation (Non-Medical): No  Physical Activity: Insufficiently Active (11/29/2023)   Exercise Vital Sign  Days of Exercise per Week: 3 days    Minutes of Exercise per Session: 30 min  Stress: Stress Concern Present (11/29/2023)   [employer] of Occupational Health - Occupational Stress Questionnaire    Feeling of Stress: To some extent  Social Connections: Moderately Isolated (11/29/2023)   Social Connection and Isolation Panel    Frequency of Communication with Friends and Family: Three times a week    Frequency of Social Gatherings with Friends and Family: Three times a week    Attends Religious Services: Patient declined    Active Member of Clubs or Organizations: No    Attends Banker Meetings: Never    Marital Status: Married    Tobacco Counseling Counseling given: Not Answered   Clinical Intake:  Pre-visit preparation completed: Yes  Pain : No/denies pain    BMI - recorded: 20.99 Nutritional Status: BMI of 19-24  Normal Nutritional Risks: None Diabetes: No  Lab Results  Component Value Date   HGBA1C 5.4 07/19/2021     How often do you need to have someone help you when you read instructions, pamphlets, or other written materials from your doctor or pharmacy?: 1 - Never  Interpreter Needed?: No  Comments: lives alone;husband in dementia facility Information entered by :: B.Ehsan Corvin,LPN   Activities of Daily Living     11/29/2023    7:38 AM  In your present state of health, do you have any  difficulty performing the following activities:  Hearing? 0  Vision? 0  Difficulty concentrating or making decisions? 0  Walking or climbing stairs? 0  Dressing or bathing? 0  Doing errands, shopping? 0  Preparing Food and eating ? N  Using the Toilet? N  In the past six months, have you accidently leaked urine? N  Do you have problems with loss of bowel control? N  Managing your Medications? N  Managing your Finances? N  Housekeeping or managing your Housekeeping? N    Patient Care Team: Corwin Antu, FNP as PCP - General (Family Medicine) Kassie Mallick, MD (Inactive) as Consulting Physician (Endocrinology) Fate Morna SAILOR, [facility] (Inactive) as Pharmacist (Pharmacist) Jaye Fallow, MD as Referring Physician (Ophthalmology)  I have updated your Care Teams any recent Medical Services you may have received from other providers in the past year.     Assessment:   This is a routine wellness examination for [place].  Hearing/Vision screen Hearing Screening - Comments:: Patient denies any hearing difficulties.  Has tinnitus in ears sometimes Vision Screening - Comments:: Pt says their vision is good with glasses for reading [facility]   Goals Addressed             This Visit's Progress    COMPLETED: Patient Stated       11/02/2018, wants to stay healthy     Patient Stated   On track    11/29/23-I will maintain and continue medications as prescribed.      Patient Stated   On track    11/29/23-Maintain current active lifestyle including working     COMPLETED: Patient Stated       Feel better, increase exercise.       Depression Screen     11/29/2023    9:00 AM 11/21/2022    8:21 AM 08/30/2022   11:49 AM 11/16/2021   10:46 AM 11/12/2020    3:15 PM 11/08/2019    3:38 PM 11/02/2018    9:04 AM  PHQ 2/9 Scores  PHQ - 2 Score 0 0 0  0 1 2 0  PHQ- 9 Score   2 0 2 2 1     Fall Risk     11/29/2023    7:38 AM 11/21/2022    8:28 AM 11/16/2021    8:54 AM 11/12/2020    1:57  PM 11/11/2019    9:39 AM  Fall Risk   Falls in the past year? 0 0 0 0 0  Number falls in past yr: 0 0 0 0   Injury with Fall? 0 0 0    Risk for fall due to : No Fall Risks No Fall Risks No Fall Risks    Follow up Education provided;Falls prevention discussed Falls prevention discussed;Falls evaluation completed Falls prevention discussed        Data saved with a previous flowsheet row definition    MEDICARE RISK AT HOME:  Medicare Risk at Home Any stairs in or around the home?: (Patient-Rptd) No Home free of loose throw rugs in walkways, pet beds, electrical cords, etc?: (Patient-Rptd) Yes Adequate lighting in your home to reduce risk of falls?: (Patient-Rptd) Yes Life alert?: (Patient-Rptd) No Use of a cane, walker or w/c?: (Patient-Rptd) No Grab bars in the bathroom?: (Patient-Rptd) Yes Shower chair or bench in shower?: (Patient-Rptd) No Elevated toilet seat or a handicapped toilet?: (Patient-Rptd) No  TIMED UP AND GO:  Was the test performed?  No  Cognitive Function: 6CIT completed    11/08/2019    3:43 PM 11/02/2018    9:08 AM  MMSE - Mini Mental State Exam  Not completed: Refused   Orientation to time  4  Orientation to Place  5  Registration  3  Attention/ Calculation  5  Recall  3  Language- name 2 objects  0  Language- repeat  1  Language- follow 3 step command  0  Language- read & follow direction  0  Write a sentence  0  Copy design  0  Total score  21        11/29/2023    9:05 AM 11/21/2022    8:30 AM 11/16/2021    8:56 AM  6CIT Screen  What Year? 0 points 0 points 0 points  What month? 0 points 0 points 0 points  What time? 0 points 0 points 0 points  Count back from 20 0 points 0 points 0 points  Months in reverse 4 points 0 points 0 points  Repeat phrase 0 points 0 points 0 points  Total Score 4 points 0 points 0 points    Immunizations Immunization History  Administered Date(s) Administered   Fluad Quad(high Dose 65+) 11/09/2018, 12/14/2019,  11/12/2020, 11/23/2021   Fluad Trivalent(High Dose 65+) 11/17/2022   Influenza Split 01/31/2011, 12/15/2011   Influenza Whole 12/10/2007, 03/10/2009, 12/08/2009   Influenza,inj,Quad PF,6+ Mos 12/26/2012, 11/07/2013, 02/13/2015, 11/13/2015, 12/14/2017   Influenza-Unspecified 12/19/2016   PFIZER(Purple Top)SARS-COV-2 Vaccination 04/27/2019, 05/18/2019, 08/21/2020   Pneumococcal Conjugate-13 05/29/2014   Pneumococcal Polysaccharide-23 04/25/2011   Td 02/08/2006   Tdap 07/01/2016   Zoster Recombinant(Shingrix) 11/15/2019, 04/10/2020   Zoster, Live 07/30/2009    Screening Tests Health Maintenance  Topic Date Due   Mammogram  09/12/2023   COVID-19 Vaccine (4 - 2025-26 season) 11/13/2023   Influenza Vaccine  06/11/2024 (Originally 10/13/2023)   Medicare Annual Wellness (AWV)  11/28/2024   Colonoscopy  12/14/2024   DTaP/Tdap/Td (3 - Td or Tdap) 07/02/2026   Pneumococcal Vaccine: 50+ Years  Completed   DEXA SCAN  Completed   Hepatitis C Screening  Completed  Zoster Vaccines- Shingrix  Completed   HPV VACCINES  Aged Out   Meningococcal B Vaccine  Aged Out    Health Maintenance Items Addressed: Appt made for pt to receive flu vaccine  Additional Screening:  Vision Screening: Recommended annual ophthalmology exams for early detection of glaucoma and other disorders of the eye. Is the patient up to date with their annual eye exam?  Yes  Who is the provider or what is the name of the office in which the patient attends annual eye exams? Dr Jaye  Dental Screening: Recommended annual dental exams for proper oral hygiene  Community Resource Referral / Chronic Care Management: CRR required this visit?  No   CCM required this visit?  Appt made for Influenza vaccine   Plan:    I have personally reviewed and noted the following in the patient's chart:   Medical and social history Use of alcohol, tobacco or illicit drugs  Current medications and supplements including opioid  prescriptions. Patient is not currently taking opioid prescriptions. Functional ability and status Nutritional status Physical activity Advanced directives List of other physicians Hospitalizations, surgeries, and ER visits in previous 12 months Vitals Screenings to include cognitive, depression, and falls Referrals and appointments  In addition, I have reviewed and discussed with patient certain preventive protocols, quality metrics, and best practice recommendations. A written personalized care plan for preventive services as well as general preventive health recommendations were provided to patient.   Erminio LITTIE Saris, LPN   [id number]   After Visit Summary: (MyChart) Due to this being a telephonic visit, the after visit summary with patients personalized plan was offered to patient via MyChart   Notes: Nothing significant to report at this time.

## 2023-12-11 ENCOUNTER — Other Ambulatory Visit: Payer: Self-pay | Admitting: Family

## 2023-12-11 DIAGNOSIS — K219 Gastro-esophageal reflux disease without esophagitis: Secondary | ICD-10-CM

## 2024-01-22 ENCOUNTER — Ambulatory Visit: Payer: Self-pay

## 2024-01-22 NOTE — Telephone Encounter (Signed)
 NOTED

## 2024-01-22 NOTE — Telephone Encounter (Signed)
 FYI Only or Action Required?: FYI only for provider: appointment scheduled on 01/24/24.  Patient was last seen in primary care on 10/19/2023 by Corwin Antu, FNP.  Called Nurse Triage reporting Fatigue.  Symptoms began several weeks ago.  Interventions attempted: Prescription medications: B12.  Symptoms are: gradually worsening.  Triage Disposition: See PCP When Office is Open (Within 3 Days)  Patient/caregiver understands and will follow disposition?: Yes  Copied from CRM [id number]. Topic: Clinical - Red Word Triage >> Jan 22, 2024  2:31 PM Alfonso ORN wrote: Red Word that prompted transfer to Nurse Triage: extreme fatigue, out of breath after dropping dosage in half for b12 as suggested  Reason for Disposition  [1] Fatigue (i.e., tires easily, decreased energy) AND [2] persists > 1 week  Answer Assessment - Initial Assessment Questions Pt reports increased fatigue and that she tires more easily for past several weeks. Pt thinks it may be r/t having her b12 dose cut from 1000 mcg to 500 mcg several months ago. Able to stand and walk and go about daily life. Appt scheduled with pcp for Wednesday. Advised UC or ED for worsening symptoms.  1. DESCRIPTION: Describe how you are feeling.     Increased tiredness, tires easily  2. SEVERITY: How bad is it?  Can you stand and walk?     Moderate, when up working and moving gets tired. Able to stand and walk.  3. ONSET: When did these symptoms begin? (e.g., hours, days, weeks, months)     A few weeks ago  4. CAUSE: What do you think is causing the weakness or fatigue? (e.g., not drinking enough fluids, medical problem, trouble sleeping)     Cut b12 in half (from 1000 mcg) several months ago  5. NEW MEDICINES:  Have you started on any new medicines recently? (e.g., opioid pain medicines, benzodiazepines, muscle relaxants, antidepressants, antihistamines, neuroleptics, beta blockers)     Denies  6. OTHER SYMPTOMS: Do you have any  other symptoms? (e.g., chest pain, fever, cough, SOB, vomiting, diarrhea, bleeding, other areas of pain)     Gets a little winded when exerting self. Denies respiratory history.  Protocols used: Weakness (Generalized) and Fatigue-A-AH

## 2024-01-24 ENCOUNTER — Ambulatory Visit: Admitting: Family

## 2024-01-24 VITALS — BP 122/74 | HR 78 | Temp 98.4°F | Ht 61.0 in | Wt 97.2 lb

## 2024-01-24 DIAGNOSIS — R400 Somnolence: Secondary | ICD-10-CM

## 2024-01-24 DIAGNOSIS — R0609 Other forms of dyspnea: Secondary | ICD-10-CM

## 2024-01-24 DIAGNOSIS — R7989 Other specified abnormal findings of blood chemistry: Secondary | ICD-10-CM | POA: Diagnosis not present

## 2024-01-24 LAB — CBC
HCT: 42.2 % (ref 36.0–46.0)
Hemoglobin: 14.2 g/dL (ref 12.0–15.0)
MCHC: 33.5 g/dL (ref 30.0–36.0)
MCV: 92.3 fl (ref 78.0–100.0)
Platelets: 206 K/uL (ref 150.0–400.0)
RBC: 4.58 Mil/uL (ref 3.87–5.11)
RDW: 12.8 % (ref 11.5–15.5)
WBC: 5.5 K/uL (ref 4.0–10.5)

## 2024-01-24 LAB — B12 AND FOLATE PANEL
Folate: >23.7 ng/mL (ref >5.9)
Vitamin B-12: 1051 pg/mL — ABNORMAL HIGH (ref 211–911)

## 2024-01-24 LAB — IBC + FERRITIN
Ferritin: 64.8 ng/mL (ref 10.0–291.0)
Iron: 101 ug/dL (ref 42–145)
Saturation Ratios: 28.7 % (ref 20.0–50.0)
TIBC: 351.4 ug/dL (ref 250.0–450.0)
Transferrin: 251 mg/dL (ref 212.0–360.0)

## 2024-01-24 LAB — BASIC METABOLIC PANEL WITH GFR
BUN: 17 mg/dL (ref 6–23)
CO2: 29 meq/L (ref 19–32)
Calcium: 9.4 mg/dL (ref 8.4–10.5)
Chloride: 99 meq/L (ref 96–112)
Creatinine, Ser: 0.78 mg/dL (ref 0.40–1.20)
GFR: 73.05 mL/min (ref >60.00)
Glucose, Bld: 84 mg/dL (ref 70–99)
Potassium: 4.6 meq/L (ref 3.5–5.1)
Sodium: 134 meq/L — ABNORMAL LOW (ref 135–145)

## 2024-01-24 LAB — TSH: TSH: 2.04 u[IU]/mL (ref 0.35–5.50)

## 2024-01-24 NOTE — Progress Notes (Signed)
 Established Patient Office Visit  Subjective:      CC:  Chief Complaint  Patient presents with   Fatigue    HPI: Kristen Caldwell is a 78 y.o. female presenting on 01/24/2024 for Fatigue .  Discussed the use of AI scribe software for clinical note transcription with the patient, who gave verbal consent to proceed.  History of Present Illness Kristen Caldwell is a 78 year old female who presents with shortness of breath on exertion.  She has been experiencing shortness of breath and fatigue primarily during exertion for the past week. These symptoms occur when she is working and exerting herself, coinciding with an increase in her workload and visits to see her husband. She describes feeling 'wore out' and 'stressed out' due to her current responsibilities, which include working three days a week and visiting her husband twice a week. No chest pain, palpitations, swelling in her feet, or urinary symptoms.  She has been taking amitriptyline  and is currently on 500 mcg of B12, which she has been cutting into smaller doses. She mentions a history of low sodium levels and does not add salt to her food due to a dislike for its taste.  Her diet includes oatmeal with blueberries for breakfast, chicken, turkey, and Yoplait yogurt with 12 grams of protein daily. She avoids dairy due to lactose intolerance and drinks lactose-free milk. She maintains a stable weight and reports a BMI of 18.3. She does not consume protein shakes but is open to alternatives like almond milk protein shakes.  Socially, she is supported by her family, with her daughter and son visiting weekly. She has decided to retire by the end of the year due to the physical and emotional toll of her current lifestyle.         Social history:  Relevant past medical, surgical, family and social history reviewed and updated as indicated. Interim medical history since our last visit  reviewed.  Allergies and medications reviewed and updated.  DATA REVIEWED: CHART IN EPIC     ROS: Negative unless specifically indicated above in HPI.    Current Outpatient Medications:    amitriptyline  (ELAVIL ) 25 MG tablet, TAKE 1 TABLET BY MOUTH AT BEDTIME, Disp: 90 tablet, Rfl: 1   Calcium Carbonate-Vit D-Min (CALTRATE 600+D PLUS) 600-400 MG-UNIT per tablet, Take 2 tablets by mouth daily., Disp: , Rfl:    clobetasol  cream (TEMOVATE ) 0.05 %, Apply 1 Application topically 2 (two) times daily., Disp: 30 g, Rfl: 0   clorazepate  (TRANXENE ) 3.75 MG tablet, Take 1 tablet (3.75 mg total) by mouth daily., Disp: 90 tablet, Rfl: 0   Cranberry (AZO CRANBERRY GUMMIES) 250 MG CHEW, Chew 250 mg by mouth daily., Disp: , Rfl:    estradiol  (ESTRACE ) 0.5 MG tablet, Take 1 tablet (0.5 mg total) by mouth daily., Disp: 90 tablet, Rfl: 1   fluticasone  (FLONASE ) 50 MCG/ACT nasal spray, Place 2 sprays into both nostrils daily., Disp: 16 g, Rfl: 1   loratadine  (CLARITIN ) 10 MG tablet, Take 1 tablet (10 mg total) by mouth daily., Disp: , Rfl:    meclizine  (ANTIVERT ) 12.5 MG tablet, Take 1 tablet (12.5 mg total) by mouth 3 (three) times daily as needed for dizziness., Disp: 30 tablet, Rfl: 0   Multiple Vitamins-Minerals (CENTRUM SILVER) CHEW, Chew 1 tablet by mouth daily., Disp: , Rfl:    Omega-3 Fatty Acids (FISH OIL PO), Take 1 capsule by mouth 2 (two) times daily., Disp: , Rfl:    omeprazole  (  PRILOSEC) 20 MG capsule, Take 1 capsule by mouth once daily, Disp: 90 capsule, Rfl: 0   ondansetron  (ZOFRAN ) 4 MG tablet, Take 1 tablet (4 mg total) by mouth every 8 (eight) hours as needed for nausea or vomiting., Disp: 20 tablet, Rfl: 0   Saccharomyces boulardii (PROBIOTIC) 250 MG CAPS, Take 1 capsule by mouth daily., Disp: , Rfl:    topiramate  (TOPAMAX ) 25 MG tablet, Take 1 tablet by mouth twice daily, Disp: 180 tablet, Rfl: 3   TURMERIC PO, Take by mouth., Disp: , Rfl:    vitamin B-12 (CYANOCOBALAMIN ) 500 MCG  tablet, Take 500 mcg by mouth daily., Disp: , Rfl:         Objective:        BP 122/74 (BP Location: Left Arm, Patient Position: Sitting, Cuff Size: Normal)   Pulse 78   Temp 98.4 F (36.9 C) (Temporal)   Ht 5' 1 (1.549 m)   Wt 97 lb 3.2 oz (44.1 kg)   SpO2 98%   BMI 18.37 kg/m   Physical Exam MEASUREMENTS: BMI- 18.3. CARDIOVASCULAR: No murmurs.  Wt Readings from Last 3 Encounters:  01/24/24 97 lb 3.2 oz (44.1 kg)  11/29/23 97 lb (44 kg)  10/19/23 97 lb (44 kg)    Physical Exam Constitutional:      General: She is not in acute distress.    Appearance: Normal appearance. She is normal weight. She is not ill-appearing, toxic-appearing or diaphoretic.  HENT:     Head: Normocephalic.     Right Ear: Tympanic membrane normal.     Left Ear: Tympanic membrane normal.     Nose: Nose normal.     Mouth/Throat:     Mouth: Mucous membranes are dry.     Pharynx: No oropharyngeal exudate or posterior oropharyngeal erythema.  Eyes:     Extraocular Movements: Extraocular movements intact.     Pupils: Pupils are equal, round, and reactive to light.  Cardiovascular:     Rate and Rhythm: Normal rate and regular rhythm.     Pulses: Normal pulses.     Heart sounds: Normal heart sounds.  Pulmonary:     Effort: Pulmonary effort is normal.     Breath sounds: Normal breath sounds.  Musculoskeletal:     Cervical back: Normal range of motion.     Right lower leg: No edema.     Left lower leg: No edema.  Neurological:     General: No focal deficit present.     Mental Status: She is alert and oriented to person, place, and time. Mental status is at baseline.  Psychiatric:        Mood and Affect: Mood normal.        Behavior: Behavior normal.        Thought Content: Thought content normal.        Judgment: Judgment normal.      nmm Wt Readings from Last 3 Encounters:  01/24/24 97 lb 3.2 oz (44.1 kg)  11/29/23 97 lb (44 kg)  10/19/23 97 lb (44 kg)         Results LABS Vitamin B12: 500 pg/mL  Assessment & Plan:   Assessment and Plan Assessment & Plan Exertional dyspnea and fatigue Recent onset of exertional dyspnea and fatigue, likely related to increased workload and stress. No associated chest pain, palpitations, or peripheral edema. Differential includes anemia, electrolyte imbalance, thyroid  dysfunction, or depression. No new medications or significant changes in health status. Symptoms correlate with increased physical and emotional stress. -  Ordered labs to check B12 levels, anemia, electrolytes, kidney function, and thyroid  function. - Advised to monitor for worsening symptoms such as increased shortness of breath or peripheral edema. - Encouraged taking time off work to reduce stress and workload.  Somnolence Likely related to increased workload and stress. No new medications or significant changes in health status. Symptoms may improve with reduced workload and stress management. - Encouraged taking time off work to reduce stress and workload.  Abnormal blood chemistry (pending evaluation) Pending evaluation of blood chemistry to rule out anemia, electrolyte imbalance, or thyroid  dysfunction as potential causes of exertional dyspnea and fatigue. - Ordered labs to evaluate blood chemistry, including B12 levels, anemia, electrolytes, kidney function, and thyroid  function.        Return if symptoms worsen or fail to improve.     Ginger Patrick, MSN, APRN, FNP-C [facility]

## 2024-01-25 ENCOUNTER — Ambulatory Visit: Payer: Self-pay | Admitting: Family

## 2024-02-01 ENCOUNTER — Other Ambulatory Visit: Payer: Self-pay | Admitting: Family

## 2024-02-01 DIAGNOSIS — F411 Generalized anxiety disorder: Secondary | ICD-10-CM

## 2024-02-07 ENCOUNTER — Encounter: Payer: Medicare HMO | Admitting: Family

## 2024-02-13 ENCOUNTER — Ambulatory Visit: Admitting: Family

## 2024-02-13 ENCOUNTER — Telehealth: Payer: Self-pay | Admitting: Family

## 2024-02-13 ENCOUNTER — Encounter: Payer: Self-pay | Admitting: Family

## 2024-02-13 VITALS — BP 124/78 | HR 72 | Temp 98.1°F | Ht 61.0 in | Wt 97.4 lb

## 2024-02-13 DIAGNOSIS — R002 Palpitations: Secondary | ICD-10-CM

## 2024-02-13 DIAGNOSIS — R0609 Other forms of dyspnea: Secondary | ICD-10-CM

## 2024-02-13 DIAGNOSIS — F410 Panic disorder [episodic paroxysmal anxiety] without agoraphobia: Secondary | ICD-10-CM

## 2024-02-13 NOTE — Telephone Encounter (Signed)
 pt on amitriptyline  was given this years ago for anxiety she is on 25 mg. anxiety is not controlled. I want to start her on sertraline  but I think maybe I need to decrease amitriptyline  to 10 mg for a few months prior to stop but can i start 25 mg sertraline  while tapering down?

## 2024-02-13 NOTE — Telephone Encounter (Signed)
 Copied from CRM #[id number]. Topic: Clinical - Medication Question >> Feb 13, 2024 11:20 AM Shanda MATSU wrote: Reason for CRM: Patient called in regards to it showing on her After Visit Summary that med, clorazepate  (TRANXENE ) 3.75 MG tablet, is to be refilled, patient stated that she and the provider did not discuss this med as it was just refilled, patient stated that another med, amitriptyline  (ELAVIL ) 25 MG tablet, was discussed and whether it should be changed, patient is req a call back to clarify why med, clorazepate  (TRANXENE ) 3.75 MG tablet, is showing as needing to be refilled and to confirm what she and the provider discussed.

## 2024-02-13 NOTE — Telephone Encounter (Signed)
 LM for pt to return my call

## 2024-02-13 NOTE — Progress Notes (Signed)
 Established Patient Office Visit  Subjective:      CC:  Chief Complaint  Patient presents with   Annual Exam    HPI: Kristen Caldwell is a 78 y.o. female presenting on 02/13/2024 for Annual Exam .  Discussed the use of AI scribe software for clinical note transcription with the patient, who gave verbal consent to proceed.  History of Present Illness Kristen Caldwell is a 78 year old female who presents with anxiety and shortness of breath.  She experiences persistent shortness of breath, particularly after getting up from bed, which sometimes requires her to sit back down to relax. She feels anxious upon waking and finds that eating helps her feel better. Throughout the day, she experiences fatigue and weakness, especially when shopping for groceries. These symptoms have not improved despite dietary changes, including increased protein intake with daily protein drinks and boiled eggs.  She has a history of anxiety and has been taking amitriptyline  for over twenty years, initially prescribed for nerve issues. She also takes clorazepate , which she uses regularly, including on the day of the visit due to nervousness. She sometimes experiences palpitations when anxious. She has reduced her B12 intake to what is included in her multivitamin after previously taking 500 mcg daily.  She focuses on fiber intake, consuming oatmeal daily, and acknowledges a preference for carbohydrates like bread and potatoes. No chest pain, but she describes a 'heavy' feeling in her chest when emotional.  She has been on topiramate  for migraine management. Her B12 levels were noted to be high in previous labs, and she has stopped taking additional B12 supplements.         Social history:  Relevant past medical, surgical, family and social history reviewed and updated as indicated. Interim medical history since our last visit reviewed.  Allergies and medications reviewed and  updated.  DATA REVIEWED: CHART IN EPIC     ROS: Negative unless specifically indicated above in HPI.    Current Outpatient Medications:    amitriptyline  (ELAVIL ) 25 MG tablet, TAKE 1 TABLET BY MOUTH AT BEDTIME, Disp: 90 tablet, Rfl: 1   Calcium Carbonate-Vit D-Min (CALTRATE 600+D PLUS) 600-400 MG-UNIT per tablet, Take 2 tablets by mouth daily., Disp: , Rfl:    clobetasol  cream (TEMOVATE ) 0.05 %, Apply 1 Application topically 2 (two) times daily., Disp: 30 g, Rfl: 0   clorazepate  (TRANXENE ) 3.75 MG tablet, Take 1 tablet by mouth once daily, Disp: 90 tablet, Rfl: 1   Cranberry (AZO CRANBERRY GUMMIES) 250 MG CHEW, Chew 250 mg by mouth daily., Disp: , Rfl:    estradiol  (ESTRACE ) 0.5 MG tablet, Take 1 tablet (0.5 mg total) by mouth daily., Disp: 90 tablet, Rfl: 1   fluticasone  (FLONASE ) 50 MCG/ACT nasal spray, Place 2 sprays into both nostrils daily., Disp: 16 g, Rfl: 1   loratadine  (CLARITIN ) 10 MG tablet, Take 1 tablet (10 mg total) by mouth daily., Disp: , Rfl:    meclizine  (ANTIVERT ) 12.5 MG tablet, Take 1 tablet (12.5 mg total) by mouth 3 (three) times daily as needed for dizziness., Disp: 30 tablet, Rfl: 0   Multiple Vitamins-Minerals (CENTRUM SILVER) CHEW, Chew 1 tablet by mouth daily., Disp: , Rfl:    Omega-3 Fatty Acids (FISH OIL PO), Take 1 capsule by mouth 2 (two) times daily., Disp: , Rfl:    omeprazole  (PRILOSEC) 20 MG capsule, Take 1 capsule by mouth once daily, Disp: 90 capsule, Rfl: 0   ondansetron  (ZOFRAN ) 4 MG tablet, Take 1  tablet (4 mg total) by mouth every 8 (eight) hours as needed for nausea or vomiting., Disp: 20 tablet, Rfl: 0   Saccharomyces boulardii (PROBIOTIC) 250 MG CAPS, Take 1 capsule by mouth daily., Disp: , Rfl:    topiramate  (TOPAMAX ) 25 MG tablet, Take 1 tablet by mouth twice daily, Disp: 180 tablet, Rfl: 3   TURMERIC PO, Take by mouth., Disp: , Rfl:    vitamin B-12 (CYANOCOBALAMIN ) 500 MCG tablet, Take 500 mcg by mouth daily., Disp: , Rfl:          Objective:        BP 124/78 (BP Location: Left Arm, Patient Position: Sitting, Cuff Size: Normal)   Pulse 72   Temp 98.1 F (36.7 C) (Temporal)   Ht 5' 1 (1.549 m)   Wt 97 lb 6.4 oz (44.2 kg)   SpO2 98%   BMI 18.40 kg/m   Physical Exam    Wt Readings from Last 3 Encounters:  02/13/24 97 lb 6.4 oz (44.2 kg)  01/24/24 97 lb 3.2 oz (44.1 kg)  11/29/23 97 lb (44 kg)    Physical Exam Constitutional:      General: She is not in acute distress.    Appearance: Normal appearance. She is normal weight. She is not ill-appearing, toxic-appearing or diaphoretic.  HENT:     Head: Normocephalic.  Cardiovascular:     Rate and Rhythm: Normal rate and regular rhythm.  Pulmonary:     Effort: Pulmonary effort is normal.     Breath sounds: Normal breath sounds.  Abdominal:     General: Bowel sounds are normal.  Musculoskeletal:        General: Normal range of motion.     Right lower leg: No edema.     Left lower leg: No edema.  Neurological:     General: No focal deficit present.     Mental Status: She is alert and oriented to person, place, and time. Mental status is at baseline.  Psychiatric:        Mood and Affect: Mood normal.        Behavior: Behavior normal.        Thought Content: Thought content normal.        Judgment: Judgment normal.          Results LABS Vitamin B12: elevated (01/24/2024)  Assessment & Plan:   Assessment and Plan Assessment & Plan Exertional dyspnea and fatigue Persistent exertional dyspnea and fatigue without improvement. Differential includes cardiac issues such as decreased cardiac output or arrhythmia. Labs previously checked with no significant findings. Dyspnea on exertion suggests possible cardiac etiology. - Ordered EKG to assess heart rhythm - Ordered echocardiogram to evaluate cardiac function  Anxiety disorder with somnolence and palpitations Anxiety disorder with somnolence and occasional palpitations. Current treatment  with amitriptyline  and clorazepate  may not be effective. Amitriptyline  has been used for over 20 years, and clorazepate  use indicates suboptimal anxiety control. Discussed potential risks of long-term benzodiazepine use, including dementia, falls, and confusion. Consideration of switching to sertraline  for better anxiety management. Discussed tapering amitriptyline  due to long-term use and potential side effects in older adults. - Taper amitriptyline  to 10 mg and we will start SSRI at low dose while tapering down with end gal to d/c amitriptyline .  - Monitor response to medication changes  Abnormal blood chemistry Previous labs showed slightly low levels and high B12. B12 supplementation was reduced to 500 mcg daily. Monitoring of B12 levels is necessary due to supplementation changes. - Will  repeat B12 level in a couple of months        Return in about 2 months (around 04/15/2024) for f/u anxiety .     Ginger Patrick, MSN, APRN, FNP-C [facility]

## 2024-02-14 DIAGNOSIS — H2511 Age-related nuclear cataract, right eye: Secondary | ICD-10-CM | POA: Diagnosis not present

## 2024-02-14 DIAGNOSIS — H2512 Age-related nuclear cataract, left eye: Secondary | ICD-10-CM | POA: Diagnosis not present

## 2024-02-14 DIAGNOSIS — M3501 Sicca syndrome with keratoconjunctivitis: Secondary | ICD-10-CM | POA: Diagnosis not present

## 2024-02-14 DIAGNOSIS — H11121 Conjunctival concretions, right eye: Secondary | ICD-10-CM | POA: Diagnosis not present

## 2024-02-14 DIAGNOSIS — H35363 Drusen (degenerative) of macula, bilateral: Secondary | ICD-10-CM | POA: Diagnosis not present

## 2024-02-14 NOTE — Telephone Encounter (Signed)
 Sertraline  of course is on her intolerance list for diarrhea.  She does not have prolonged QT on recent EKG she is having some DOE and some occasional heart palpitations that are a work up in progress currently.   Hesitant to start lexapro, or do you think that this is reasonable?

## 2024-02-15 NOTE — Telephone Encounter (Signed)
 Spoke with pt and she states that she looked at her AVS wrong. Pt does not currently have any questions. Nothing further was needed at this time.

## 2024-02-16 MED ORDER — SERTRALINE HCL 25 MG PO TABS: ORAL_TABLET | ORAL | 1 refills | Status: DC

## 2024-02-16 MED ORDER — AMITRIPTYLINE HCL 10 MG PO TABS: 10.0000 mg | ORAL_TABLET | Freq: Every day | ORAL | 1 refills | Status: DC

## 2024-02-16 NOTE — Addendum Note (Signed)
 Addended by: CORWIN ANTU on: 02/16/2024 07:22 AM   Modules accepted: Orders

## 2024-02-16 NOTE — Telephone Encounter (Signed)
 Can we confirm is she saying she needs a clorazapate refill? I have not made any changes to the clorazapate that would affect a refill if she needed this.  I am going to decrease her amitriptyline  to 10 mg for now and she can start sertraline , 1/2 tablet once daily x one week then increase to one tablet once a week. I am starting her at a very low dose to reduce risk for side effects.   Have her schedule a 3 week f/u. We will see how she is doing at that visit however she can call to let me know if she has any issues or use mychart prior to the f/u.   Monitor for sedative effects, increased lethargy fatigue. Just be aware that this could increase risk for falls although she will probably tolerate well.   Diarrhea, nausea, sometimes headaches are common in the first week or so but then should improve and or resolve.

## 2024-02-16 NOTE — Telephone Encounter (Signed)
 Spoke with pt. She DOES NOT need a refill on clorazepate . Pt is aware of how to take her medications. She is scheduled for a preop appointment for cataract surgery on 02/28/24, she will call after this appointment to make her follow up appointment with Tabitha.

## 2024-02-17 ENCOUNTER — Encounter: Payer: Self-pay | Admitting: Family

## 2024-02-19 ENCOUNTER — Inpatient Hospital Stay: Admission: RE | Admit: 2024-02-19 | Discharge: 2024-02-19 | Attending: Family

## 2024-02-19 DIAGNOSIS — F419 Anxiety disorder, unspecified: Secondary | ICD-10-CM | POA: Diagnosis not present

## 2024-02-19 DIAGNOSIS — R0609 Other forms of dyspnea: Secondary | ICD-10-CM

## 2024-02-19 DIAGNOSIS — R519 Headache, unspecified: Secondary | ICD-10-CM | POA: Diagnosis not present

## 2024-02-19 LAB — ECHOCARDIOGRAM COMPLETE
AR max vel: 3.28 cm2
AV Area VTI: 3.07 cm2
AV Area mean vel: 2.81 cm2
AV Mean grad: 2 mmHg
AV Peak grad: 3.1 mmHg
Ao pk vel: 0.88 m/s
Area-P 1/2: 3.81 cm2
MV VTI: 2.71 cm2
S' Lateral: 2.2 cm

## 2024-02-19 NOTE — Progress Notes (Signed)
*  PRELIMINARY RESULTS* Echocardiogram 2D Echocardiogram has been performed.  Floydene Harder 02/19/2024, 11:32 AM

## 2024-02-19 NOTE — Telephone Encounter (Signed)
 noted

## 2024-02-20 ENCOUNTER — Ambulatory Visit: Payer: Self-pay | Admitting: Family

## 2024-02-20 DIAGNOSIS — R0609 Other forms of dyspnea: Secondary | ICD-10-CM

## 2024-02-20 DIAGNOSIS — I5189 Other ill-defined heart diseases: Secondary | ICD-10-CM

## 2024-02-20 DIAGNOSIS — R55 Syncope and collapse: Secondary | ICD-10-CM

## 2024-02-21 ENCOUNTER — Encounter: Payer: Self-pay | Admitting: Internal Medicine

## 2024-02-21 ENCOUNTER — Ambulatory Visit: Attending: Internal Medicine | Admitting: Internal Medicine

## 2024-02-21 ENCOUNTER — Other Ambulatory Visit

## 2024-02-21 VITALS — BP 130/80 | HR 81 | Ht 62.0 in | Wt 97.0 lb

## 2024-02-21 DIAGNOSIS — R072 Precordial pain: Secondary | ICD-10-CM | POA: Diagnosis not present

## 2024-02-21 DIAGNOSIS — I5189 Other ill-defined heart diseases: Secondary | ICD-10-CM

## 2024-02-21 DIAGNOSIS — R0609 Other forms of dyspnea: Secondary | ICD-10-CM | POA: Diagnosis not present

## 2024-02-21 DIAGNOSIS — Z79899 Other long term (current) drug therapy: Secondary | ICD-10-CM

## 2024-02-21 MED ORDER — METOPROLOL TARTRATE 100 MG PO TABS: 100.0000 mg | ORAL_TABLET | Freq: Once | ORAL | 0 refills | Status: DC

## 2024-02-21 NOTE — Progress Notes (Signed)
°  Cardiology Office Note:  .   Date:  02/21/2024  ID:  Anai Lipson, DOB 01-13-1946, MRN [id number] PCP: Corwin Antu, FNP  [facility] Providers Cardiologist:  None     History of Present Illness: .   Chiann Goffredo is a 78 y.o. female with history of diverticulosis, GERD, chronic headaches, depression, and anxiety, referred for evaluation of shortness of breath and grade 1 diastolic dysfunction noted on echo.  Today, Ms. Segreto reports that she has felt short of breath for 3 months, associated with increased stress at work and her impending retirement.  She notes she has been dealing with anxiety for a long time and attributes many of her symptoms to this.  Her husband is also at a long-term health care facility, which has been stressful for her.  She denies any dyspnea at rest but gets short of breath primarily when lifting heavy objects.  She does not exercise or walk briskly because she is concerned that this could make her feel bad.  She denies orthopnea and edema.  Ms. Altemose notes that she recently developed heaviness in her chest during stressful situations.  This typically resolves when she relaxes for 5 to 10 minutes.  Sometimes, this chest discomfort and the aforementioned dyspnea are accompanied by palpitations.  Other than the recent echo demonstrating grade 1 diastolic dysfunction, Ms. Collingsworth denies prior cardiac testing.  ROS: See HPI  Studies Reviewed: SABRA   EKG Interpretation Date/Time:  Wednesday February 21 2024 10:07:37 EST Ventricular Rate:  81 PR Interval:  142 QRS Duration:  140 QT Interval:  354 QTC Calculation: 411 R Axis:   -15  Text Interpretation: Normal sinus rhythm Right bundle branch block Abnormal ECG When compared with ECG of 12-May-2020 09:30, No significant change was found Confirmed by Dorna Mallet [phone]) on 02/21/2024 10:16:31 AM    TTE (02/19/2024): Normal LV size and wall thickness.  LVEF 55-60% with normal wall  motion and grade 1 diastolic dysfunction.  Risk Assessment/Calculations:             Physical Exam:   VS:  BP 130/80 (BP Location: Left Arm, Patient Position: Sitting, Cuff Size: Normal)   Pulse 81   Ht 5' 2 (1.575 m)   Wt 97 lb (44 kg)   SpO2 98%   BMI 17.74 kg/m    Wt Readings from Last 3 Encounters:  02/21/24 97 lb (44 kg)  02/13/24 97 lb 6.4 oz (44.2 kg)  01/24/24 97 lb 3.2 oz (44.1 kg)    General:  NAD. Neck: No JVD or HJR. Lungs: Clear to auscultation bilaterally without wheezes or crackles. Heart: Regular rate and rhythm without murmurs, rubs, or gallops. Abdomen: Soft, nontender, nondistended. Extremities: No lower extremity edema.  ASSESSMENT AND PLAN: .    Chest pain, dyspnea on exertion, and diastolic dysfunction: Ms. Tall reports 36-month history of dyspnea and intermittent chest heaviness which seem to be exacerbated by ongoing life stressors.  Her recent echo was notable for grade 1 diastolic dysfunction, which is an age-appropriate finding for Ms. Hochmuth.  No other structural abnormality was identified.  However, given her new symptoms, we have agreed to pursue ischemia evaluation with coronary CTA.  If this is unrevealing, I do not think that further cardiac testing or intervention would be indicated.  Continue her current anxiety regimen under the direction of Ms. Dugal.    Dispo: Return to clinic in 3 months.  Signed, Lonni Hanson, MD

## 2024-02-21 NOTE — Patient Instructions (Addendum)
 Medication Instructions:   Please take Metoprolol  Tartrate 100 MG once two hours prior to Cardiac CTA.  *If you need a refill on your cardiac medications before your next appointment, please call your pharmacy*  Lab Work: No labs ordered today  If you have labs (blood work) drawn today and your tests are completely normal, you will receive your results only by: MyChart Message (if you have MyChart) OR A paper copy in the mail If you have any lab test that is abnormal or we need to change your treatment, we will call you to review the results.  Testing/Procedures:   Your cardiac CT will be scheduled at one of the below locations:    [facility] [address] [place], [place] [address] [phone]   If scheduled at [facility], please arrive to the Heart and Vascular Center 15 mins early for check-in and test prep.  There is spacious parking and easy access to the radiology department from the [facility] entrance. Please enter here and check-in with the desk attendant.    Please follow these instructions carefully (unless otherwise directed):  An IV will be required for this test and Nitroglycerin will be given.  Hold all erectile dysfunction medications at least 3 days (72 hrs) prior to test. (Ie viagra, cialis, sildenafil, tadalafil, etc)   On the Night Before the Test: Be sure to Drink plenty of water. Do not consume any caffeinated/decaffeinated beverages or chocolate 12 hours prior to your test. Do not take any antihistamines 12 hours prior to your test.  On the Day of the Test: Drink plenty of water until 1 hour prior to the test. Do not eat any food 1 hour prior to test. You may take your regular medications prior to the test.  Take metoprolol  (Lopressor ) two hours prior to test. If you take Furosemide/Hydrochlorothiazide/Spironolactone/Chlorthalidone, please HOLD on the morning of the test. Patients who  wear a continuous glucose monitor MUST remove the device prior to scanning. FEMALES- please wear underwire-free bra if available, avoid dresses & tight clothing        After the Test: Drink plenty of water. After receiving IV contrast, you may experience a mild flushed feeling. This is normal. On occasion, you may experience a mild rash up to 24 hours after the test. This is not dangerous. If this occurs, you can take Benadryl  25 mg, Zyrtec, Claritin , or Allegra and increase your fluid intake. (Patients taking Tikosyn should avoid Benadryl , and may take Zyrtec, Claritin , or Allegra) If you experience trouble breathing, this can be serious. If it is severe call 911 IMMEDIATELY. If it is mild, please call our office.  We will call to schedule your test 2-4 weeks out understanding that some insurance companies will need an authorization prior to the service being performed.   For more information and frequently asked questions, please visit our website : [email or web address]  For non-scheduling related questions, please contact the cardiac imaging nurse navigator should you have any questions/concerns: Cardiac Imaging Nurse Navigators Direct Office Dial: [phone]   For scheduling needs, including cancellations and rescheduling, please call Brittany, [phone].   Follow-Up: At [facility], you and your health needs are our priority.  As part of our continuing mission to provide you with exceptional heart care, our providers are all part of one team.  This team includes your primary Cardiologist (physician) and Advanced Practice Providers or APPs (Physician Assistants and Nurse Practitioners) who all work together to  provide you with the care you need, when you need it.  Your next appointment:   3 month(s)  Provider:   You may see Lonni Hanson, MD or one of the following Advanced Practice Providers on your designated Care Team:   Lonni Meager, NP Lesley Maffucci, PA-C Bernardino Bring, PA-C Cadence Sturtevant, PA-C Tylene Lunch, NP Barnie Hila, NP    We recommend signing up for the patient portal called MyChart.  Sign up information is provided on this After Visit Summary.  MyChart is used to connect with patients for Virtual Visits (Telemedicine).  Patients are able to view lab/test results, encounter notes, upcoming appointments, etc.  Non-urgent messages can be sent to your provider as well.   To learn more about what you can do with MyChart, go to [email or web address].

## 2024-02-22 ENCOUNTER — Encounter: Payer: Self-pay | Admitting: Internal Medicine

## 2024-02-26 ENCOUNTER — Ambulatory Visit (HOSPITAL_COMMUNITY): Admission: RE | Admit: 2024-02-26 | Discharge: 2024-02-26 | Attending: Internal Medicine | Admitting: Internal Medicine

## 2024-02-26 DIAGNOSIS — R072 Precordial pain: Secondary | ICD-10-CM | POA: Diagnosis not present

## 2024-02-26 MED ORDER — NITROGLYCERIN 0.4 MG SL SUBL
0.8000 mg | SUBLINGUAL_TABLET | Freq: Once | SUBLINGUAL | Status: AC
  Administered 2024-02-26: 11:00:00 0.8 mg via SUBLINGUAL

## 2024-02-26 MED ORDER — IOHEXOL 350 MG/ML SOLN
100.0000 mL | Freq: Once | INTRAVENOUS | Status: AC | PRN
  Administered 2024-02-26: 11:00:00 100 mL via INTRAVENOUS

## 2024-02-27 ENCOUNTER — Ambulatory Visit: Payer: Self-pay | Admitting: Internal Medicine

## 2024-02-28 DIAGNOSIS — H2513 Age-related nuclear cataract, bilateral: Secondary | ICD-10-CM | POA: Diagnosis not present

## 2024-02-28 DIAGNOSIS — H2512 Age-related nuclear cataract, left eye: Secondary | ICD-10-CM | POA: Diagnosis not present

## 2024-02-29 ENCOUNTER — Encounter: Payer: Self-pay | Admitting: Ophthalmology

## 2024-02-29 ENCOUNTER — Telehealth: Payer: Self-pay | Admitting: *Deleted

## 2024-02-29 NOTE — Telephone Encounter (Signed)
 Copied from CRM [id number]. Topic: Clinical - Request for Lab/Test Order >> Feb 29, 2024 11:30 AM Robinson DEL wrote: Reason for CRM: Patient calling to verify if Tabitha put in lab orders for patient through [facility], states she received a text from [facility] stating that her doctor put in orders. Agent verified no active orders in system, advised patient not to input any personal information in case it's scam related, patient wanted number to [facility] to see if she can obtain more information and agent provided number we have on file.  Ladaja [phone]

## 2024-03-01 ENCOUNTER — Encounter: Payer: Self-pay | Admitting: Ophthalmology

## 2024-03-01 NOTE — Anesthesia Preprocedure Evaluation (Addendum)
 "                                  Anesthesia Evaluation  Patient identified by MRN, date of birth, ID band Patient awake    Reviewed: Allergy  & Precautions, H&P , NPO status , Patient's Chart, lab work & pertinent test results  Airway Mallampati: I  TM Distance: >3 FB Neck ROM: Full    Dental no notable dental hx.  Has some crowns, but isn't sure where in mouth they are:   Pulmonary neg pulmonary ROS   Pulmonary exam normal breath sounds clear to auscultation       Cardiovascular + DOE  negative cardio ROS Normal cardiovascular exam Rhythm:Regular Rate:Normal  02-19-24 echo 1. Left ventricular ejection fraction, by estimation, is 55 to 60%. Left  ventricular ejection fraction by 3D volume is 54 %. The left ventricle has  normal function. The left ventricle has no regional wall motion  abnormalities. Left ventricular diastolic   parameters are consistent with Grade I diastolic dysfunction (impaired  relaxation).   2. Right ventricular systolic function is normal. The right ventricular  size is normal. There is normal pulmonary artery systolic pressure.   3. The mitral valve is normal in structure. No evidence of mitral valve  regurgitation. No evidence of mitral stenosis.   4. The aortic valve is normal in structure. Aortic valve regurgitation is  not visualized. No aortic stenosis is present.   5. The inferior vena cava is normal in size with greater than 50%  respiratory variability, suggesting right atrial pressure of 3 mmHg.   02-27-24 cardiac CT cardiac CTA shows no evidence of plaque buildup or narrowing/blockage in her heart arteries.      Neuro/Psych  Headaches PSYCHIATRIC DISORDERS Anxiety Depression    negative neurological ROS  negative psych ROS   GI/Hepatic negative GI ROS, Neg liver ROS,GERD  ,,  Endo/Other  negative endocrine ROS    Renal/GU Renal diseasenegative Renal ROS  negative genitourinary   Musculoskeletal negative  musculoskeletal ROS (+)    Abdominal   Peds negative pediatric ROS (+)  Hematology negative hematology ROS (+)   Anesthesia Other Findings Depression  GERD (gastroesophageal reflux disease) Osteoporosis  Chronic headaches Diverticulosis  COVID Grade I diastolic dysfunction History of palpitations Somnolence  DOE (dyspnea on exertion) Generalized anxiety disorder with panic attacks  Seasonal allergic rhinitis due to pollen Migraine  Primary exertional headache    Reproductive/Obstetrics negative OB ROS                              Anesthesia Physical Anesthesia Plan  ASA: 2  Anesthesia Plan: MAC   Post-op Pain Management:    Induction: Intravenous  PONV Risk Score and Plan:   Airway Management Planned: Natural Airway and Nasal Cannula  Additional Equipment:   Intra-op Plan:   Post-operative Plan:   Informed Consent: I have reviewed the patients History and Physical, chart, labs and discussed the procedure including the risks, benefits and alternatives for the proposed anesthesia with the patient or authorized representative who has indicated his/her understanding and acceptance.     Dental Advisory Given  Plan Discussed with: Anesthesiologist, CRNA and Surgeon  Anesthesia Plan Comments: (Patient consented for risks of anesthesia including but not limited to:  - adverse reactions to medications - damage to eyes, teeth, lips or other oral mucosa - nerve  damage due to positioning  - sore throat or hoarseness - Damage to heart, brain, nerves, lungs, other parts of body or loss of life  Patient voiced understanding and assent.)        Anesthesia Quick Evaluation  "

## 2024-03-08 ENCOUNTER — Other Ambulatory Visit: Payer: Self-pay | Admitting: Certified Nurse Midwife

## 2024-03-08 ENCOUNTER — Other Ambulatory Visit: Payer: Self-pay | Admitting: Family

## 2024-03-08 DIAGNOSIS — K219 Gastro-esophageal reflux disease without esophagitis: Secondary | ICD-10-CM

## 2024-03-11 NOTE — Discharge Instructions (Signed)

## 2024-03-12 ENCOUNTER — Other Ambulatory Visit: Payer: Self-pay

## 2024-03-12 ENCOUNTER — Encounter
Admission: RE | Disposition: A | Discharge status: Home / Self Care | Payer: Self-pay | Source: Home / Self Care | Attending: Ophthalmology

## 2024-03-12 ENCOUNTER — Encounter: Payer: Self-pay | Admitting: Ophthalmology

## 2024-03-12 ENCOUNTER — Ambulatory Visit
Admission: RE | Admit: 2024-03-12 | Discharge: 2024-03-12 | Disposition: A | Discharge status: Home / Self Care | Attending: Ophthalmology | Admitting: Ophthalmology

## 2024-03-12 ENCOUNTER — Ambulatory Visit: Payer: Self-pay | Admitting: Anesthesiology

## 2024-03-12 DIAGNOSIS — M810 Age-related osteoporosis without current pathological fracture: Secondary | ICD-10-CM | POA: Diagnosis not present

## 2024-03-12 DIAGNOSIS — K219 Gastro-esophageal reflux disease without esophagitis: Secondary | ICD-10-CM | POA: Insufficient documentation

## 2024-03-12 DIAGNOSIS — H2512 Age-related nuclear cataract, left eye: Secondary | ICD-10-CM | POA: Insufficient documentation

## 2024-03-12 HISTORY — PX: CATARACT EXTRACTION W/PHACO: SHX586

## 2024-03-12 SURGERY — PHACOEMULSIFICATION, CATARACT, WITH IOL INSERTION
Anesthesia: Monitor Anesthesia Care | Site: Eye | Laterality: Left

## 2024-03-12 MED ORDER — LIDOCAINE HCL (PF) 2 % IJ SOLN
INTRAOCULAR | Status: DC | PRN
  Administered 2024-03-12: 2 mL

## 2024-03-12 MED ORDER — SIGHTPATH DOSE#1 NA CHONDROIT SULF-NA HYALURON 40-17 MG/ML IO SOLN
INTRAOCULAR | Status: DC | PRN
  Administered 2024-03-12: 1 mL via INTRAOCULAR

## 2024-03-12 MED ORDER — SIGHTPATH DOSE#1 BSS IO SOLN: INTRAOCULAR | Status: DC | PRN

## 2024-03-12 MED ORDER — MIDAZOLAM HCL (PF) 2 MG/2ML IJ SOLN
INTRAMUSCULAR | Status: DC | PRN
  Administered 2024-03-12: 1 mg via INTRAVENOUS

## 2024-03-12 MED ORDER — TETRACAINE HCL 0.5 % OP SOLN
OPHTHALMIC | Status: AC
  Filled 2024-03-12: qty 4

## 2024-03-12 MED ORDER — MOXIFLOXACIN HCL 0.5 % OP SOLN
OPHTHALMIC | Status: DC | PRN
  Administered 2024-03-12: .2 mL via OPHTHALMIC

## 2024-03-12 MED ORDER — PHENYLEPHRINE HCL 10 % OP SOLN
1.0000 [drp] | OPHTHALMIC | Status: AC | PRN
  Administered 2024-03-12 (×3): 1 [drp] via OPHTHALMIC

## 2024-03-12 MED ORDER — CYCLOPENTOLATE HCL 2 % OP SOLN
1.0000 [drp] | OPHTHALMIC | Status: DC | PRN
  Administered 2024-03-12 (×2): 1 [drp] via OPHTHALMIC

## 2024-03-12 MED ORDER — FENTANYL CITRATE (PF) 100 MCG/2ML IJ SOLN
INTRAMUSCULAR | Status: AC
  Filled 2024-03-12: qty 2

## 2024-03-12 MED ORDER — PHENYLEPHRINE HCL 10 % OP SOLN
OPHTHALMIC | Status: AC
  Filled 2024-03-12: qty 5

## 2024-03-12 MED ORDER — FENTANYL CITRATE (PF) 100 MCG/2ML IJ SOLN
INTRAMUSCULAR | Status: DC | PRN
  Administered 2024-03-12: 50 ug via INTRAVENOUS

## 2024-03-12 MED ORDER — TETRACAINE HCL 0.5 % OP SOLN
1.0000 [drp] | OPHTHALMIC | Status: DC | PRN
  Administered 2024-03-12 (×3): 1 [drp] via OPHTHALMIC

## 2024-03-12 MED ORDER — CYCLOPENTOLATE HCL 2 % OP SOLN
OPHTHALMIC | Status: AC
  Filled 2024-03-12: qty 2

## 2024-03-12 MED ORDER — SIGHTPATH DOSE#1 BSS IO SOLN
INTRAOCULAR | Status: DC | PRN
  Administered 2024-03-12: 15 mL via INTRAOCULAR

## 2024-03-12 MED ORDER — MIDAZOLAM HCL 2 MG/2ML IJ SOLN
INTRAMUSCULAR | Status: AC
  Filled 2024-03-12: qty 2

## 2024-03-12 SURGICAL SUPPLY — 9 items
CANNULA ANT/CHMB 27G (MISCELLANEOUS) ×1 IMPLANT
CYSTOTOME ANGL RVRS SHRT 25G (CUTTER) ×1 IMPLANT
FEE CATARACT SUITE SIGHTPATH (MISCELLANEOUS) ×1 IMPLANT
GLOVE BIOGEL PI IND STRL 8 (GLOVE) ×1 IMPLANT
GLOVE SURG LX STRL 8.0 MICRO (GLOVE) ×1 IMPLANT
GLOVE SURG SYN 6.5 PF PI BL (GLOVE) ×1 IMPLANT
LENS IOL TECNIS EYHANCE 22.0 (Intraocular Lens) IMPLANT
NEEDLE FILTER BLUNT 18X1 1/2 (NEEDLE) ×1 IMPLANT
SYR 3ML LL SCALE MARK (SYRINGE) ×1 IMPLANT

## 2024-03-12 NOTE — Transfer of Care (Addendum)
 Immediate Anesthesia Transfer of Care Note  Patient: Kristen Caldwell  Procedure(s) Performed: PHACOEMULSIFICATION, CATARACT, WITH IOL INSERTION (Left)  Patient Location: PACU  Anesthesia Type: MAC  Level of Consciousness: awake, alert  and patient cooperative  Airway and Oxygen Therapy: Patient Spontanous Breathing   Post-op Assessment: Post-op Vital signs reviewed, Patient's Cardiovascular Status Stable, Respiratory Function Stable, Patent Airway and No signs of Nausea or vomiting  Post-op Vital Signs: Reviewed and stable  Complications: No notable events documented.

## 2024-03-12 NOTE — H&P (Signed)
 [facility]   Primary Care Physician:  Corwin Antu, FNP Ophthalmologist: Dr. Elsie Carmine  Pre-Procedure History & Physical: HPI:  Kristen Caldwell is a 78 y.o. female here for cataract surgery.   Past Medical History:  Diagnosis Date   Chronic headaches    COVID 03/03/2020   Depression    Diverticulosis    DOE (dyspnea on exertion)    Generalized anxiety disorder with panic attacks    GERD (gastroesophageal reflux disease)    Grade I diastolic dysfunction    History of palpitations    Migraine    Osteoporosis    Primary exertional headache    Seasonal allergic rhinitis due to pollen    Somnolence     Past Surgical History:  Procedure Laterality Date   ABDOMINAL HYSTERECTOMY  03/14/1989   partial   COLONOSCOPY     COLONOSCOPY WITH PROPOFOL  N/A 12/14/2021   Procedure: COLONOSCOPY WITH PROPOFOL ;  Surgeon: Unk Corinn Skiff, MD;  Location: [facility] ENDOSCOPY;  Service: Gastroenterology;  Laterality: N/A;   TUBAL LIGATION      Prior to Admission medications  Medication Sig Start Date End Date Taking? Authorizing Provider  amitriptyline  (ELAVIL ) 10 MG tablet Take 1 tablet (10 mg total) by mouth at bedtime. 02/16/24  Yes Dugal, Tabitha, FNP  Calcium Carbonate-Vit D-Min (CALTRATE 600+D PLUS) 600-400 MG-UNIT per tablet Take 2 tablets by mouth daily.   Yes [provider]  clorazepate  (TRANXENE ) 3.75 MG tablet Take 1 tablet by mouth once daily 02/01/24  Yes Dugal, Tabitha, FNP  Cranberry (AZO CRANBERRY GUMMIES) 250 MG CHEW Chew 250 mg by mouth daily.   Yes [provider]  estradiol  (ESTRACE ) 0.5 MG tablet Take 1 tablet (0.5 mg total) by mouth daily. Patient taking differently: Take 0.25 mg by mouth daily. 03/27/23  Yes Jayne Harlene CROME, CNM  fluticasone  (FLONASE ) 50 MCG/ACT nasal spray Place 2 sprays into both nostrils daily. 10/19/23  Yes Dugal, Tabitha, FNP  loratadine  (CLARITIN ) 10 MG tablet Take 1 tablet (10 mg total) by mouth daily.  10/19/23  Yes Dugal, Tabitha, FNP  meclizine  (ANTIVERT ) 12.5 MG tablet Take 1 tablet (12.5 mg total) by mouth 3 (three) times daily as needed for dizziness. 06/16/21  Yes Velma Harlene, MD  Multiple Vitamins-Minerals (CENTRUM SILVER) CHEW Chew 1 tablet by mouth daily.   Yes [provider]  omeprazole  (PRILOSEC) 20 MG capsule Take 1 capsule by mouth once daily 03/08/24  Yes Dugal, Tabitha, FNP  ondansetron  (ZOFRAN ) 4 MG tablet Take 1 tablet (4 mg total) by mouth every 8 (eight) hours as needed for nausea or vomiting. 06/07/22  Yes Dugal, Tabitha, FNP  Saccharomyces boulardii (PROBIOTIC) 250 MG CAPS Take 1 capsule by mouth daily.   Yes [provider]  topiramate  (TOPAMAX ) 25 MG tablet Take 1 tablet by mouth twice daily 04/03/23  Yes Dugal, Tabitha, FNP  TURMERIC PO Take by mouth.   Yes [provider]  clobetasol  cream (TEMOVATE ) 0.05 % Apply 1 Application topically 2 (two) times daily. 02/13/23   Corwin Antu, FNP  metoprolol  tartrate (LOPRESSOR ) 100 MG tablet Take 1 tablet (100 mg total) by mouth once for 1 dose. Please take 2 hours prior to Cardiac CTA. 02/21/24 02/21/24  End, Lonni, MD  Omega-3 Fatty Acids (FISH OIL PO) Take 1 capsule by mouth 2 (two) times daily. Patient not taking: Reported on 02/29/2024    [provider]  sertraline  (ZOLOFT ) 25 MG tablet Take one po every day Patient not taking: Reported on 02/29/2024  02/16/24   Dugal, Tabitha, FNP  vitamin B-12 (CYANOCOBALAMIN ) 500 MCG tablet Take 500 mcg by mouth daily. Patient not taking: Reported on 02/29/2024    [provider]    Allergies as of 02/16/2024 - Review Complete 02/13/2024  Allergen Reaction Noted   Cefuroxime  Nausea Only 07/19/2021   Doxycycline  Nausea Only 10/20/2023   Lagevrio  [molnupiravir ] Nausea Only 06/06/2022   Amoxicillin Nausea Only 08/25/2006   Cephalexin  03/05/2007   Promethazine hcl Rash 11/01/2010   Sertraline  Diarrhea 05/03/2019   Sulfa  antibiotics  Nausea Only 02/14/2022    Family History  Problem Relation Age of Onset   Aneurysm Mother    Stroke Father 69   Hypertension Father    Lupus Sister    Osteoporosis Sister    Hypertension Brother    Heart attack Brother 28   Breast cancer Paternal Aunt    Diabetes Paternal Grandfather    Colon cancer Neg Hx     Social History   Socioeconomic History   Marital status: Married    Spouse name: Elsie   Number of children: 2   Years of education: high school   Highest education level: 12th grade  Occupational History   Not on file  Tobacco Use   Smoking status: Never   Smokeless tobacco: Never  Vaping Use   Vaping status: Never Used  Substance and Sexual Activity   Alcohol use: No   Drug use: No   Sexual activity: Not Currently    Partners: Male  Other Topics Concern   Not on file  Social History Narrative   04/20/20   From: the area   Living: with son - Marinda and husband is in long term care   Work: part-time 2 days a week, making window treatments      Family: Marinda and Grayce - 2 grandchildren, and 1 adopted grandchildren       Enjoys: work is fun it is with friends      Exercise: walking, bending at work   Diet: tries to eat healthy      Safety   Seat belts: Yes    Guns: Yes  and secure   Safe in relationships: Yes             Social Drivers of Health   Tobacco Use: Low Risk (02/29/2024)   Patient History    Smoking Tobacco Use: Never    Smokeless Tobacco Use: Never    Passive Exposure: Not on file  Financial Resource Strain: Low Risk (01/24/2024)   Overall Financial Resource Strain (CARDIA)    Difficulty of Paying Living Expenses: Not hard at all  Food Insecurity: No Food Insecurity (01/24/2024)   Epic    Worried About Radiation Protection Practitioner of Food in the Last Year: Never true    Ran Out of Food in the Last Year: Never true  Transportation Needs: No Transportation Needs (01/24/2024)   Epic    Lack of Transportation (Medical): No    Lack of  Transportation (Non-Medical): No  Physical Activity: Insufficiently Active (01/24/2024)   Exercise Vital Sign    Days of Exercise per Week: 4 days    Minutes of Exercise per Session: 30 min  Stress: Stress Concern Present (01/24/2024)   [employer] of Occupational Health - Occupational Stress Questionnaire    Feeling of Stress: To some extent  Social Connections: Moderately Isolated (01/24/2024)   Social Connection and Isolation Panel    Frequency of Communication with Friends and Family: Twice a week  Frequency of Social Gatherings with Friends and Family: Three times a week    Attends Religious Services: Patient declined    Active Member of Clubs or Organizations: No    Attends Banker Meetings: Not on file    Marital Status: Married  Intimate Partner Violence: Not At Risk (11/29/2023)   Epic    Fear of Current or Ex-Partner: No    Emotionally Abused: No    Physically Abused: No    Sexually Abused: No  Depression (PHQ2-9): Low Risk (02/13/2024)   Depression (PHQ2-9)    PHQ-2 Score: 4  Alcohol Screen: Low Risk (11/29/2023)   Alcohol Screen    Last Alcohol Screening Score (AUDIT): 0  Housing: Unknown (01/24/2024)   Epic    Unable to Pay for Housing in the Last Year: No    Number of Times Moved in the Last Year: Not on file    Homeless in the Last Year: No  Utilities: Not At Risk (11/29/2023)   Epic    Threatened with loss of utilities: No  Health Literacy: Adequate Health Literacy (11/29/2023)   B1300 Health Literacy    Frequency of need for help with medical instructions: Never    Review of Systems: See HPI, otherwise negative ROS  Physical Exam: BP (!) 142/74   Pulse 91   Temp (!) 97.2 F (36.2 C)   Resp 18   Ht 5' 1 (1.549 m)   Wt 43.9 kg   SpO2 99%   BMI 18.29 kg/m  General:   Alert, cooperative. Head:  Normocephalic and atraumatic. Respiratory:  Normal work of breathing. Cardiovascular:  NAD  Impression/Plan: Kristen Jokerst  Caldwell is here for cataract surgery.  Risks, benefits, limitations, and alternatives regarding cataract surgery have been reviewed with the patient.  Questions have been answered.  All parties agreeable.   Elsie Carmine, MD  03/12/2024, 10:36 AM

## 2024-03-12 NOTE — Anesthesia Postprocedure Evaluation (Signed)
"   Anesthesia Post Note  Patient: Kristen Caldwell  Procedure(s) Performed: PHACOEMULSIFICATION, CATARACT, WITH IOL INSERTION 4.80 00:29.6 (Left: Eye)  Patient location during evaluation: PACU Anesthesia Type: MAC Level of consciousness: awake and alert Pain management: pain level controlled Vital Signs Assessment: post-procedure vital signs reviewed and stable Respiratory status: spontaneous breathing, nonlabored ventilation, respiratory function stable and patient connected to nasal cannula oxygen Cardiovascular status: stable and blood pressure returned to baseline Postop Assessment: no apparent nausea or vomiting Anesthetic complications: no   No notable events documented.   Last Vitals:  Vitals:   03/12/24 1100 03/12/24 1103  BP: 114/81 133/76  Pulse: 90 86  Resp: 15 (!) 23  Temp:  (!) 36.1 C  SpO2: 100% 99%    Last Pain:  Vitals:   03/12/24 1103  PainSc: 0-No pain                 Kristen Caldwell      "

## 2024-03-12 NOTE — Op Note (Signed)
 PREOPERATIVE DIAGNOSIS:  Nuclear sclerotic cataract of the left eye.   POSTOPERATIVE DIAGNOSIS:  Nuclear sclerotic cataract of the left eye.   OPERATIVE PROCEDURE:ORPROCALL@   SURGEON:  Elsie Carmine, MD.   ANESTHESIA:  Anesthesiologist: Ola Donny BROCKS, MD CRNA: Veronica Alm BROCKS, CRNA  1.      Managed anesthesia care. 2.     0.74ml of Shugarcaine was instilled following the paracentesis   COMPLICATIONS:  None.   TECHNIQUE:   Stop and chop   DESCRIPTION OF PROCEDURE:  The patient was examined and consented in the preoperative holding area where the aforementioned topical anesthesia was applied to the left eye and then brought back to the Operating Room where the left eye was prepped and draped in the usual sterile ophthalmic fashion and a lid speculum was placed. A paracentesis was created with the side port blade and the anterior chamber was filled with viscoelastic. A near clear corneal incision was performed with the steel keratome. A continuous curvilinear capsulorrhexis was performed with a cystotome followed by the capsulorrhexis forceps. Hydrodissection and hydrodelineation were carried out with BSS on a blunt cannula. The lens was removed in a stop and chop  technique and the remaining cortical material was removed with the irrigation-aspiration handpiece. The capsular bag was inflated with viscoelastic and the intraocular lens was placed in the capsular bag without complication. The remaining viscoelastic was removed from the eye with the irrigation-aspiration handpiece. The wounds were hydrated. The anterior chamber was flushed with BSS and the eye was inflated to physiologic pressure. 0.35ml Vigamox  was placed in the anterior chamber. The wounds were found to be water tight. The eye was dressed with Combigan. The patient was given protective glasses to wear throughout the day and a shield with which to sleep tonight. The patient was also given drops with which to begin a drop regimen  today and will follow-up with me in one day. Implant Name Type Inv. Item Serial No. Manufacturer Lot No. LRB No. Used Action  LENS IOL TECNIS EYHANCE 22.0 - [id number] Intraocular Lens LENS IOL TECNIS EYHANCE 22.0 [id number] [employer]  Left 1 Implanted    Procedures: PHACOEMULSIFICATION, CATARACT, WITH IOL INSERTION 4.80 00:29.6 (Left)  Electronically signed: Elsie Carmine 03/12/2024 10:58 AM

## 2024-03-15 ENCOUNTER — Telehealth: Payer: Self-pay | Admitting: Family

## 2024-03-15 DIAGNOSIS — F410 Panic disorder [episodic paroxysmal anxiety] without agoraphobia: Secondary | ICD-10-CM

## 2024-03-15 NOTE — Telephone Encounter (Signed)
 Copied from CRM [id number]. Topic: Clinical - Prescription Issue >> Mar 15, 2024  3:24 PM Donna BRAVO wrote: Reason for CRM: Patient stated the amitriptyline  (ELAVIL ) 10 MG tablet dose is suposed to be 25mg  and to please change it back.  [facility] [address], [place] - [address] [place] [address] Phone: [phone] Fax: [phone] Hours: Not open 24 hours

## 2024-03-17 ENCOUNTER — Emergency Department

## 2024-03-17 ENCOUNTER — Other Ambulatory Visit: Payer: Self-pay | Admitting: Family

## 2024-03-17 ENCOUNTER — Other Ambulatory Visit: Payer: Self-pay

## 2024-03-17 ENCOUNTER — Emergency Department
Admission: EM | Admit: 2024-03-17 | Discharge: 2024-03-17 | Disposition: A | Discharge status: Home / Self Care | Attending: Emergency Medicine | Admitting: Emergency Medicine

## 2024-03-17 DIAGNOSIS — R519 Headache, unspecified: Secondary | ICD-10-CM | POA: Insufficient documentation

## 2024-03-17 DIAGNOSIS — R112 Nausea with vomiting, unspecified: Secondary | ICD-10-CM | POA: Diagnosis present

## 2024-03-17 DIAGNOSIS — R42 Dizziness and giddiness: Secondary | ICD-10-CM | POA: Diagnosis not present

## 2024-03-17 LAB — URINALYSIS, ROUTINE W REFLEX MICROSCOPIC
Bacteria, UA: NONE SEEN
Bilirubin Urine: NEGATIVE
Glucose, UA: NEGATIVE mg/dL
Hgb urine dipstick: NEGATIVE
Ketones, ur: 5 mg/dL — AB
Nitrite: NEGATIVE
Protein, ur: NEGATIVE mg/dL
Specific Gravity, Urine: 1.024 (ref 1.005–1.030)
pH: 6 (ref 5.0–8.0)

## 2024-03-17 LAB — COMPREHENSIVE METABOLIC PANEL WITH GFR
ALT: 17 U/L (ref 0–44)
AST: 24 U/L (ref 15–41)
Albumin: 4.6 g/dL (ref 3.5–5.0)
Alkaline Phosphatase: 89 U/L (ref 38–126)
Anion gap: 15 (ref 5–15)
BUN: 31 mg/dL — ABNORMAL HIGH (ref 8–23)
CO2: 26 mmol/L (ref 22–32)
Calcium: 10.1 mg/dL (ref 8.9–10.3)
Chloride: 98 mmol/L (ref 98–111)
Creatinine, Ser: 0.89 mg/dL (ref 0.44–1.00)
GFR, Estimated: >60 mL/min
Glucose, Bld: 163 mg/dL — ABNORMAL HIGH (ref 70–99)
Potassium: 3.8 mmol/L (ref 3.5–5.1)
Sodium: 139 mmol/L (ref 135–145)
Total Bilirubin: 0.5 mg/dL (ref 0.0–1.2)
Total Protein: 8.1 g/dL (ref 6.5–8.1)

## 2024-03-17 LAB — CBC
HCT: 47.6 % — ABNORMAL HIGH (ref 36.0–46.0)
Hemoglobin: 15.7 g/dL — ABNORMAL HIGH (ref 12.0–15.0)
MCH: 30.5 pg (ref 26.0–34.0)
MCHC: 33 g/dL (ref 30.0–36.0)
MCV: 92.6 fL (ref 80.0–100.0)
Platelets: 211 K/uL (ref 150–400)
RBC: 5.14 MIL/uL — ABNORMAL HIGH (ref 3.87–5.11)
RDW: 11.9 % (ref 11.5–15.5)
WBC: 13.5 K/uL — ABNORMAL HIGH (ref 4.0–10.5)
nRBC: 0 % (ref 0.0–0.2)

## 2024-03-17 LAB — LIPASE, BLOOD: Lipase: 41 U/L (ref 11–51)

## 2024-03-17 MED ORDER — SODIUM CHLORIDE 0.9 % IV BOLUS
1000.0000 mL | Freq: Once | INTRAVENOUS | Status: AC
  Administered 2024-03-17: 1000 mL via INTRAVENOUS

## 2024-03-17 MED ORDER — ONDANSETRON HCL 4 MG/2ML IJ SOLN
4.0000 mg | Freq: Once | INTRAMUSCULAR | Status: AC
  Administered 2024-03-17: 4 mg via INTRAVENOUS
  Filled 2024-03-17: qty 2

## 2024-03-17 MED ORDER — ONDANSETRON 4 MG PO TBDP: 4.0000 mg | ORAL_TABLET | Freq: Three times a day (TID) | ORAL | 0 refills | Status: AC | PRN

## 2024-03-17 NOTE — Telephone Encounter (Signed)
 We had tapered down in the goal of finding something better suited for anxiety hence on the 10 mg.   Is she wanting to increase back to 25 as she thinks it may help?

## 2024-03-17 NOTE — ED Triage Notes (Signed)
 Pt comes via EMS from home with dizziness and nausea last night. States after she ate she started feeling this way and throwing up.  Pt states she just feels sick. Pt did recently have cataract surgery.   Pt given zofran  and fluids by EMS. Pt has IV in place.

## 2024-03-17 NOTE — Discharge Instructions (Signed)
 Take the Zofran  as needed for nausea.  Eat light foods today such as broth, toast, or well cooked vegetables.  Follow-up with your primary care provider as needed.  Return to the ER for new, worsening, or persistent severe nausea or vomiting, abdominal pain, fever, weakness, or any other new or worsening symptoms that concern you.

## 2024-03-17 NOTE — ED Provider Notes (Signed)
 "  [facility] Provider Note    Event Date/Time   First MD Initiated Contact with Patient 03/17/24 1112     (approximate)   History   Dizziness   HPI  Kristen Caldwell is a 79 y.o. female with a history of GERD, migraine, depression, and anxiety who presents with nausea and vomiting since last night.  The patient states that she ate [employer] for dinner yesterday.  Subsequently a few hours later, she started to have nausea, frontal headache, and lightheadedness.  She then started vomiting and has vomited several times this morning.  She denies any abdominal pain or diarrhea.  She has no sick contacts or other recent illness.  The patient states that the headache is resolved.  She is still slightly dizzy and remains nauseous.  I reviewed the past medical records.  The patient's most recent outpatient encounter was with Dr. Jaye from ophthalmology on 12/30 for cataract surgery.   Physical Exam   Triage Vital Signs: ED Triage Vitals  Encounter Vitals Group     BP 03/17/24 0854 117/67     Girls Systolic BP Percentile --      Girls Diastolic BP Percentile --      Boys Systolic BP Percentile --      Boys Diastolic BP Percentile --      Pulse Rate 03/17/24 0854 (!) 113     Resp 03/17/24 0854 19     Temp 03/17/24 0854 97.9 F (36.6 C)     Temp src --      SpO2 03/17/24 0854 99 %     Weight 03/17/24 0852 96 lb 12.5 oz (43.9 kg)     Height 03/17/24 0852 5' 1 (1.549 m)     Head Circumference --      Peak Flow --      Pain Score 03/17/24 0852 0     Pain Loc --      Pain Education --      Exclude from Growth Chart --     Most recent vital signs: Vitals:   03/17/24 0854 03/17/24 1255  BP: 117/67 (!) 120/54  Pulse: (!) 113 98  Resp: 19 18  Temp: 97.9 F (36.6 C) 98.6 F (37 C)  SpO2: 99% 100%     General: Alert, relatively well-appearing, no distress.  CV:  Good peripheral perfusion.  Resp:  Normal effort.  Abd:  Soft and  nontender.  No distention.  Other:  No jaundice or scleral icterus.  Slightly dry mucous membranes.   ED Results / Procedures / Treatments   Labs (all labs ordered are listed, but only abnormal results are displayed) Labs Reviewed  CBC - Abnormal; Notable for the following components:      Result Value   WBC 13.5 (*)    RBC 5.14 (*)    Hemoglobin 15.7 (*)    HCT 47.6 (*)    All other components within normal limits  URINALYSIS, ROUTINE W REFLEX MICROSCOPIC - Abnormal; Notable for the following components:   Color, Urine YELLOW (*)    APPearance HAZY (*)    Ketones, ur 5 (*)    Leukocytes,Ua TRACE (*)    All other components within normal limits  COMPREHENSIVE METABOLIC PANEL WITH GFR - Abnormal; Notable for the following components:   Glucose, Bld 163 (*)    BUN 31 (*)    All other components within normal limits  LIPASE, BLOOD  CBG MONITORING, ED     EKG  ED ECG REPORT I, Waylon Cassis, the attending physician, personally viewed and interpreted this ECG.  Date: 03/17/2024 EKG Time: 0856 Rate: 112 Rhythm: Sinus tachycardia QRS Axis: Left axis Intervals: RBBB ST/T Wave abnormalities: normal Narrative Interpretation: no evidence of acute ischemia   RADIOLOGY  CT head: I independently viewed and interpreted the images; there is no ICH.  Radiology report indicates no acute abnormality.   PROCEDURES:  Critical Care performed: No  Procedures   MEDICATIONS ORDERED IN ED: Medications  ondansetron  (ZOFRAN ) injection 4 mg (4 mg Intravenous Given 03/17/24 1140)  sodium chloride  0.9 % bolus 1,000 mL (0 mLs Intravenous Stopped 03/17/24 1323)     IMPRESSION / MDM / ASSESSMENT AND PLAN / ED COURSE  I reviewed the triage vital signs and the nursing notes.  79 year old female with PMH as noted above presents with nausea and vomiting since last night with no significant abdominal pain or diarrhea.  On exam the patient was initially tachycardic.  Other vital signs  are normal.  She appears slightly dehydrated.  Abdomen is soft and nontender.  Differential diagnosis includes, but is not limited to, foodborne illness, viral gastroenteritis, gastroparesis, gastritis.  There is no evidence of a bowel obstruction.  Initial labs are overall reassuring.  CMP shows no acute findings.  CBC shows mild leukocytosis.  Lipase is normal.  We will give fluids, antiemetics, and reassess.  There is no indication for imaging at this time.  Patient's presentation is most consistent with acute complicated illness / injury requiring diagnostic workup.  ----------------------------------------- 2:12 PM on 03/17/2024 -----------------------------------------  The patient's nausea has improved.  She is now tolerating p.o. solids and liquids.  Her heart rate has normalized.  She states she is feeling somewhat better.  We checked orthostatic vital signs.  The patient had a slight increase in her heart rate but blood pressure was stable.  The patient states that almost every morning she feels somewhat lightheaded when she first gets up out of bed, and feels similarly now.  However, she feels comfortable going home.  Given the normalized vital signs, ability to tolerate p.o., and the reassuring lab workup, she is stable for discharge at this time.  I counseled her on the results of her workup and answered all of her questions.  I gave strict return precautions, and she expressed understanding.   FINAL CLINICAL IMPRESSION(S) / ED DIAGNOSES   Final diagnoses:  Nausea and vomiting, unspecified vomiting type     Rx / DC Orders   ED Discharge Orders          Ordered    ondansetron  (ZOFRAN -ODT) 4 MG disintegrating tablet  Every 8 hours PRN        03/17/24 1413             Note:  This document was prepared using Dragon voice recognition software and may include unintentional dictation errors.    Cassis Waylon, MD 03/17/24 1414  "

## 2024-03-18 MED ORDER — AMITRIPTYLINE HCL 25 MG PO TABS: 25.0000 mg | ORAL_TABLET | Freq: Every day | ORAL | 1 refills | Status: AC

## 2024-03-18 NOTE — Telephone Encounter (Signed)
 Spoke with pt. She is needing a prescription for amitriptyline  25mg  sent in. Pt states that she sent Tabitha a message and told her that she was going to go back on 25mg  and Tabitha responded that that sounded reasonable.

## 2024-03-18 NOTE — Addendum Note (Signed)
 Addended by: CORWIN ANTU on: 03/18/2024 10:04 AM   Modules accepted: Orders

## 2024-03-19 ENCOUNTER — Telehealth: Payer: Self-pay

## 2024-03-19 ENCOUNTER — Encounter: Payer: Self-pay | Admitting: Ophthalmology

## 2024-03-19 NOTE — Telephone Encounter (Signed)
 Spoke with pt. States that she was seen at the ED for food poisoning. Pt was treated with zofran  and has been eating bland foods and taking it easy. Pt like to know if there is anything else she should be doing to get better. She has her second cataract surgery next Tuesday. She is aware that Ginger is out of the office until tomorrow.

## 2024-03-19 NOTE — Telephone Encounter (Signed)
 Copied from CRM #[id number]. Topic: Clinical - Medical Advice >> Mar 19, 2024 12:53 PM Kristen Caldwell wrote: Reason for CRM: Pt stated that she went to the ER on 03/17/24 and was diagnosed with food poisoning. Pt stated that she is still experiencing some nausea and wants to know what Ginger Patrick, FNP, recommends. Pt is requesting to speak with Ginger Patrick, FNP, or her nurse today if possible.

## 2024-03-19 NOTE — Telephone Encounter (Signed)
 I reviewed the visit  Is she feeling better slowly since the ER visit?  I would continue with a bland diet as tolerated and nausea medication as needed  But if she notices any fever or worsening symptoms I'd like to see her because her white blood cells were increasing slightly

## 2024-03-20 NOTE — Telephone Encounter (Signed)
 Spoke with pt and she is aware of Tabitha's response. States that she is feeling some better.

## 2024-03-22 NOTE — Discharge Instructions (Signed)

## 2024-03-23 ENCOUNTER — Other Ambulatory Visit: Payer: Self-pay | Admitting: Family

## 2024-03-23 DIAGNOSIS — G8929 Other chronic pain: Secondary | ICD-10-CM

## 2024-03-26 ENCOUNTER — Other Ambulatory Visit: Payer: Self-pay

## 2024-03-26 ENCOUNTER — Encounter: Payer: Self-pay | Admitting: Ophthalmology

## 2024-03-26 ENCOUNTER — Ambulatory Visit: Payer: Self-pay | Admitting: General Practice

## 2024-03-26 ENCOUNTER — Encounter
Admission: RE | Disposition: A | Discharge status: Home / Self Care | Payer: Self-pay | Source: Home / Self Care | Attending: Ophthalmology

## 2024-03-26 ENCOUNTER — Ambulatory Visit
Admission: RE | Admit: 2024-03-26 | Discharge: 2024-03-26 | Disposition: A | Discharge status: Home / Self Care | Attending: Ophthalmology | Admitting: Ophthalmology

## 2024-03-26 DIAGNOSIS — H2511 Age-related nuclear cataract, right eye: Secondary | ICD-10-CM | POA: Insufficient documentation

## 2024-03-26 HISTORY — DX: Nausea with vomiting, unspecified: R11.2

## 2024-03-26 HISTORY — PX: CATARACT EXTRACTION W/PHACO: SHX586

## 2024-03-26 MED ORDER — TETRACAINE HCL 0.5 % OP SOLN
OPHTHALMIC | Status: AC
  Filled 2024-03-26: qty 4

## 2024-03-26 MED ORDER — PHENYLEPHRINE HCL 10 % OP SOLN
1.0000 [drp] | OPHTHALMIC | Status: AC | PRN
  Administered 2024-03-26 (×3): 1 [drp] via OPHTHALMIC

## 2024-03-26 MED ORDER — MOXIFLOXACIN HCL 0.5 % OP SOLN
OPHTHALMIC | Status: DC | PRN
  Administered 2024-03-26: .2 mL via OPHTHALMIC

## 2024-03-26 MED ORDER — SIGHTPATH DOSE#1 BSS IO SOLN
INTRAOCULAR | Status: DC | PRN
  Administered 2024-03-26: 44 mL via OPHTHALMIC

## 2024-03-26 MED ORDER — LACTATED RINGERS IV SOLN: INTRAVENOUS | Status: DC

## 2024-03-26 MED ORDER — ONDANSETRON HCL 4 MG/2ML IJ SOLN
4.0000 mg | Freq: Once | INTRAMUSCULAR | Status: AC
  Administered 2024-03-26: 4 mg via INTRAVENOUS

## 2024-03-26 MED ORDER — BRIMONIDINE TARTRATE-TIMOLOL 0.2-0.5 % OP SOLN
OPHTHALMIC | Status: DC | PRN
  Administered 2024-03-26: 1 [drp] via OPHTHALMIC

## 2024-03-26 MED ORDER — CYCLOPENTOLATE HCL 2 % OP SOLN
OPHTHALMIC | Status: AC
  Filled 2024-03-26: qty 2

## 2024-03-26 MED ORDER — MIDAZOLAM HCL 2 MG/2ML IJ SOLN
INTRAMUSCULAR | Status: AC
  Filled 2024-03-26: qty 2

## 2024-03-26 MED ORDER — SIGHTPATH DOSE#1 BSS IO SOLN
INTRAOCULAR | Status: DC | PRN
  Administered 2024-03-26: 15 mL via INTRAOCULAR

## 2024-03-26 MED ORDER — CYCLOPENTOLATE HCL 2 % OP SOLN
1.0000 [drp] | OPHTHALMIC | Status: AC | PRN
  Administered 2024-03-26 (×3): 1 [drp] via OPHTHALMIC

## 2024-03-26 MED ORDER — MIDAZOLAM HCL (PF) 2 MG/2ML IJ SOLN
INTRAMUSCULAR | Status: DC | PRN
  Administered 2024-03-26: 1.5 mg via INTRAVENOUS

## 2024-03-26 MED ORDER — PHENYLEPHRINE HCL 10 % OP SOLN
OPHTHALMIC | Status: AC
  Filled 2024-03-26: qty 5

## 2024-03-26 MED ORDER — ONDANSETRON HCL 4 MG/2ML IJ SOLN
INTRAMUSCULAR | Status: AC
  Filled 2024-03-26: qty 2

## 2024-03-26 MED ORDER — TETRACAINE HCL 0.5 % OP SOLN
1.0000 [drp] | OPHTHALMIC | Status: DC | PRN
  Administered 2024-03-26 (×3): 1 [drp] via OPHTHALMIC

## 2024-03-26 MED ORDER — SIGHTPATH DOSE#1 NA CHONDROIT SULF-NA HYALURON 40-17 MG/ML IO SOLN
INTRAOCULAR | Status: DC | PRN
  Administered 2024-03-26: 1 mL via INTRAOCULAR

## 2024-03-26 MED ORDER — LIDOCAINE HCL (PF) 2 % IJ SOLN
INTRAOCULAR | Status: DC | PRN
  Administered 2024-03-26: 2 mL

## 2024-03-26 NOTE — Anesthesia Preprocedure Evaluation (Signed)
"                                    Anesthesia Evaluation  Patient identified by MRN, date of birth, ID band Patient awake    Reviewed: Allergy  & Precautions, H&P , NPO status , Patient's Chart, lab work & pertinent test results  Airway Mallampati: II  TM Distance: >3 FB Neck ROM: Full    Dental no notable dental hx. (+) Teeth Intact   Pulmonary neg pulmonary ROS   Pulmonary exam normal breath sounds clear to auscultation       Cardiovascular negative cardio ROS Normal cardiovascular exam Rhythm:Regular Rate:Normal     Neuro/Psych negative neurological ROS  negative psych ROS   GI/Hepatic negative GI ROS, Neg liver ROS,,,  Endo/Other  negative endocrine ROS    Renal/GU negative Renal ROS  negative genitourinary   Musculoskeletal negative musculoskeletal ROS (+)    Abdominal   Peds negative pediatric ROS (+)  Hematology negative hematology ROS (+)   Anesthesia Other Findings   Reproductive/Obstetrics negative OB ROS                              Anesthesia Physical Anesthesia Plan  ASA: 2  Anesthesia Plan: MAC   Post-op Pain Management:    Induction: Intravenous  PONV Risk Score and Plan:   Airway Management Planned:   Additional Equipment:   Intra-op Plan:   Post-operative Plan: Extubation in OR  Informed Consent: I have reviewed the patients History and Physical, chart, labs and discussed the procedure including the risks, benefits and alternatives for the proposed anesthesia with the patient or authorized representative who has indicated his/her understanding and acceptance.     Dental advisory given  Plan Discussed with: CRNA  Anesthesia Plan Comments:         Anesthesia Quick Evaluation  "

## 2024-03-26 NOTE — Op Note (Signed)
 PREOPERATIVE DIAGNOSIS:  Nuclear sclerotic cataract of the right eye.   POSTOPERATIVE DIAGNOSIS:  Right Eye Cataract   OPERATIVE PROCEDURE:ORPROCALL@   SURGEON:  Elsie Carmine, MD.   ANESTHESIA:  Anesthesiologist: Maggioncalda, Fairy LABOR, MD CRNA: Pearl Ozell PARAS, CRNA  1.      Managed anesthesia care. 2.      0.38ml of Shugarcaine was instilled in the eye following the paracentesis.   COMPLICATIONS:  None.   TECHNIQUE:   Stop and chop   DESCRIPTION OF PROCEDURE:  The patient was examined and consented in the preoperative holding area where the aforementioned topical anesthesia was applied to the right eye and then brought back to the Operating Room where the right eye was prepped and draped in the usual sterile ophthalmic fashion and a lid speculum was placed. A paracentesis was created with the side port blade and the anterior chamber was filled with viscoelastic. A near clear corneal incision was performed with the steel keratome. A continuous curvilinear capsulorrhexis was performed with a cystotome followed by the capsulorrhexis forceps. Hydrodissection and hydrodelineation were carried out with BSS on a blunt cannula. The lens was removed in a stop and chop  technique and the remaining cortical material was removed with the irrigation-aspiration handpiece. The capsular bag was inflated with viscoelastic and the intraocular lens was placed in the capsular bag without complication. The remaining viscoelastic was removed from the eye with the irrigation-aspiration handpiece. The wounds were hydrated. The anterior chamber was flushed with BSS and the eye was inflated to physiologic pressure. 0.37ml of Vigamox  was placed in the anterior chamber. The wounds were found to be water tight. The eye was dressed with Combigan . The patient was given protective glasses to wear throughout the day and a shield with which to sleep tonight. The patient was also given drops with which to begin a drop regimen  today and will follow-up with me in one day. Implant Name Type Inv. Item Serial No. Manufacturer Lot No. LRB No. Used Action  LENS IOL TECNIS EYHANCE 22.0 - [id number] Intraocular Lens LENS IOL TECNIS EYHANCE 22.0 [id number] [employer]  Right 1 Implanted   Procedures: PHACOEMULSIFICATION, CATARACT, WITH IOL INSERTION  4.28 00:26.9 (Right)  Electronically signed: Elsie Carmine 03/26/2024 8:57 AM

## 2024-03-26 NOTE — H&P (Signed)
 [facility]   Primary Care Physician:  Corwin Antu, FNP Ophthalmologist: Dr. Elsie Carmine  Pre-Procedure History & Physical: HPI:  Kristen Caldwell is a 79 y.o. female here for cataract surgery.   Past Medical History:  Diagnosis Date   Chronic headaches    COVID 03/03/2020   Depression    Diverticulosis    DOE (dyspnea on exertion)    Generalized anxiety disorder with panic attacks    GERD (gastroesophageal reflux disease)    Grade I diastolic dysfunction    History of palpitations    Migraine    Osteoporosis    PONV (postoperative nausea and vomiting)    Primary exertional headache    Seasonal allergic rhinitis due to pollen    Somnolence     Past Surgical History:  Procedure Laterality Date   ABDOMINAL HYSTERECTOMY  03/14/1989   partial   CATARACT EXTRACTION W/PHACO Left 03/12/2024   Procedure: PHACOEMULSIFICATION, CATARACT, WITH IOL INSERTION 4.80 00:29.6;  Surgeon: Carmine Elsie, MD;  Location: [facility];  Service: Ophthalmology;  Laterality: Left;   COLONOSCOPY     COLONOSCOPY WITH PROPOFOL  N/A 12/14/2021   Procedure: COLONOSCOPY WITH PROPOFOL ;  Surgeon: Unk Corinn Skiff, MD;  Location: [facility];  Service: Gastroenterology;  Laterality: N/A;   TUBAL LIGATION      Prior to Admission medications  Medication Sig Start Date End Date Taking? Authorizing Provider  acetaminophen (TYLENOL) 325 MG tablet Take 650 mg by mouth every 6 (six) hours as needed.   Yes [provider]  amitriptyline  (ELAVIL ) 25 MG tablet Take 1 tablet (25 mg total) by mouth at bedtime. 03/18/24  Yes Dugal, Tabitha, FNP  Calcium Carbonate-Vit D-Min (CALTRATE 600+D PLUS) 600-400 MG-UNIT per tablet Take 2 tablets by mouth daily.   Yes [provider]  clorazepate  (TRANXENE ) 3.75 MG tablet Take 1 tablet by mouth once daily 02/01/24  Yes Dugal, Tabitha, FNP  Cranberry (AZO CRANBERRY GUMMIES) 250 MG CHEW Chew 250 mg by mouth daily.   Yes  [provider]  estradiol  (ESTRACE ) 0.5 MG tablet Take 0.5 tablets (0.25 mg total) by mouth daily. 03/13/24  Yes Jayne Harlene CROME, CNM  fluticasone  (FLONASE ) 50 MCG/ACT nasal spray Place 2 sprays into both nostrils daily. 10/19/23  Yes Dugal, Tabitha, FNP  loratadine  (CLARITIN ) 10 MG tablet Take 1 tablet (10 mg total) by mouth daily. 10/19/23  Yes Dugal, Antu, FNP  Multiple Vitamins-Minerals (CENTRUM SILVER) CHEW Chew 1 tablet by mouth daily.   Yes [provider]  omeprazole  (PRILOSEC) 20 MG capsule Take 1 capsule by mouth once daily 03/08/24  Yes Dugal, Tabitha, FNP  Saccharomyces boulardii (PROBIOTIC) 250 MG CAPS Take 1 capsule by mouth daily.   Yes [provider]  topiramate  (TOPAMAX ) 25 MG tablet Take 1 tablet by mouth twice daily 03/25/24  Yes Dugal, Tabitha, FNP  TURMERIC PO Take by mouth.   Yes [provider]  clobetasol  cream (TEMOVATE ) 0.05 % Apply 1 Application topically 2 (two) times daily. 02/13/23   Dugal, Tabitha, FNP  meclizine  (ANTIVERT ) 12.5 MG tablet Take 1 tablet (12.5 mg total) by mouth 3 (three) times daily as needed for dizziness. 06/16/21   Velma Harlene, MD  metoprolol  tartrate (LOPRESSOR ) 100 MG tablet Take 1 tablet (100 mg total) by mouth once for 1 dose. Please take 2 hours prior to Cardiac CTA. 02/21/24 02/21/24  End, Lonni, MD  Omega-3 Fatty Acids (FISH OIL PO) Take 1 capsule by mouth 2 (two) times daily. Patient not taking: Reported  on 02/29/2024    [provider]  ondansetron  (ZOFRAN -ODT) 4 MG disintegrating tablet Take 1 tablet (4 mg total) by mouth every 8 (eight) hours as needed. 03/17/24   Siadecki, Sebastian, MD  vitamin B-12 (CYANOCOBALAMIN ) 500 MCG tablet Take 500 mcg by mouth daily. Patient not taking: Reported on 02/29/2024    [provider]    Allergies as of 02/16/2024 - Review Complete 02/13/2024  Allergen Reaction Noted   Cefuroxime  Nausea Only 07/19/2021   Doxycycline  Nausea Only  10/20/2023   Lagevrio  [molnupiravir ] Nausea Only 06/06/2022   Amoxicillin Nausea Only 08/25/2006   Cephalexin  03/05/2007   Promethazine hcl Rash 11/01/2010   Sertraline  Diarrhea 05/03/2019   Sulfa  antibiotics Nausea Only 02/14/2022    Family History  Problem Relation Age of Onset   Aneurysm Mother    Stroke Father 11   Hypertension Father    Lupus Sister    Osteoporosis Sister    Hypertension Brother    Heart attack Brother 16   Breast cancer Paternal Aunt    Diabetes Paternal Grandfather    Colon cancer Neg Hx     Social History   Socioeconomic History   Marital status: Married    Spouse name: Elsie   Number of children: 2   Years of education: high school   Highest education level: 12th grade  Occupational History   Not on file  Tobacco Use   Smoking status: Never   Smokeless tobacco: Never  Vaping Use   Vaping status: Never Used  Substance and Sexual Activity   Alcohol use: No   Drug use: No   Sexual activity: Not Currently    Partners: Male  Other Topics Concern   Not on file  Social History Narrative   04/20/20   From: the area   Living: with son - Marinda and husband is in long term care   Work: part-time 2 days a week, making window treatments      Family: Marinda and Grayce - 2 grandchildren, and 1 adopted grandchildren       Enjoys: work is fun it is with friends      Exercise: walking, bending at work   Diet: tries to eat healthy      Safety   Seat belts: Yes    Guns: Yes  and secure   Safe in relationships: Yes             Social Drivers of Health   Tobacco Use: Low Risk (03/26/2024)   Patient History    Smoking Tobacco Use: Never    Smokeless Tobacco Use: Never    Passive Exposure: Not on file  Financial Resource Strain: Low Risk (01/24/2024)   Overall Financial Resource Strain (CARDIA)    Difficulty of Paying Living Expenses: Not hard at all  Food Insecurity: No Food Insecurity (01/24/2024)   Epic    Worried About Radiation Protection Practitioner  of Food in the Last Year: Never true    Ran Out of Food in the Last Year: Never true  Transportation Needs: No Transportation Needs (01/24/2024)   Epic    Lack of Transportation (Medical): No    Lack of Transportation (Non-Medical): No  Physical Activity: Insufficiently Active (01/24/2024)   Exercise Vital Sign    Days of Exercise per Week: 4 days    Minutes of Exercise per Session: 30 min  Stress: Stress Concern Present (01/24/2024)   [employer] of Occupational Health - Occupational Stress Questionnaire    Feeling of Stress: To  some extent  Social Connections: Moderately Isolated (01/24/2024)   Social Connection and Isolation Panel    Frequency of Communication with Friends and Family: Twice a week    Frequency of Social Gatherings with Friends and Family: Three times a week    Attends Religious Services: Patient declined    Active Member of Clubs or Organizations: No    Attends Banker Meetings: Not on file    Marital Status: Married  Intimate Partner Violence: Not At Risk (11/29/2023)   Epic    Fear of Current or Ex-Partner: No    Emotionally Abused: No    Physically Abused: No    Sexually Abused: No  Depression (PHQ2-9): Low Risk (02/13/2024)   Depression (PHQ2-9)    PHQ-2 Score: 4  Alcohol Screen: Low Risk (11/29/2023)   Alcohol Screen    Last Alcohol Screening Score (AUDIT): 0  Housing: Unknown (01/24/2024)   Epic    Unable to Pay for Housing in the Last Year: No    Number of Times Moved in the Last Year: Not on file    Homeless in the Last Year: No  Utilities: Not At Risk (11/29/2023)   Epic    Threatened with loss of utilities: No  Health Literacy: Adequate Health Literacy (11/29/2023)   B1300 Health Literacy    Frequency of need for help with medical instructions: Never    Review of Systems: See HPI, otherwise negative ROS  Physical Exam: BP (!) 141/78   Pulse 95   Temp (!) 97.3 F (36.3 C)   Ht 5' 1 (1.549 m)   Wt 42.6 kg   SpO2 100%    BMI 17.76 kg/m  General:   Alert, cooperative. Head:  Normocephalic and atraumatic. Respiratory:  Normal work of breathing. Cardiovascular:  NAD  Impression/Plan: Kristen Caldwell is here for cataract surgery.  Risks, benefits, limitations, and alternatives regarding cataract surgery have been reviewed with the patient.  Questions have been answered.  All parties agreeable.   Elsie Carmine, MD  03/26/2024, 8:34 AM

## 2024-03-26 NOTE — Anesthesia Postprocedure Evaluation (Signed)
"   Anesthesia Post Note  Patient: Kristen Caldwell  Procedure(s) Performed: PHACOEMULSIFICATION, CATARACT, WITH IOL INSERTION  4.28 00:26.9 (Right: Eye)  Patient location during evaluation: PACU Anesthesia Type: MAC Level of consciousness: awake and alert Pain management: pain level controlled Vital Signs Assessment: post-procedure vital signs reviewed and stable Respiratory status: spontaneous breathing, nonlabored ventilation, respiratory function stable and patient connected to nasal cannula oxygen Cardiovascular status: stable and blood pressure returned to baseline Postop Assessment: no apparent nausea or vomiting Anesthetic complications: no   No notable events documented.   Last Vitals:  Vitals:   03/26/24 0858 03/26/24 0900  BP:  135/77  Pulse: 84 81  Resp: 17 (!) 21  Temp:    SpO2: 100% 100%    Last Pain:  Vitals:   03/26/24 0900  PainSc: 0-No pain                 Fairy A Kennidi Yoshida      "

## 2024-03-26 NOTE — Transfer of Care (Signed)
 Immediate Anesthesia Transfer of Care Note  Patient: Kristen Caldwell  Procedure(s) Performed: PHACOEMULSIFICATION, CATARACT, WITH IOL INSERTION  4.28 00:26.9 (Right: Eye)  Patient Location: PACU  Anesthesia Type: MAC  Level of Consciousness: awake, alert  and patient cooperative  Airway and Oxygen Therapy: Patient Spontanous Breathing and Patient connected to supplemental oxygen  Post-op Assessment: Post-op Vital signs reviewed, Patient's Cardiovascular Status Stable, Respiratory Function Stable, Patent Airway and No signs of Nausea or vomiting  Post-op Vital Signs: Reviewed and stable  Complications: No notable events documented.

## 2024-04-08 ENCOUNTER — Encounter: Payer: Self-pay | Admitting: Family

## 2024-04-08 ENCOUNTER — Ambulatory Visit: Payer: Self-pay | Admitting: Family

## 2024-04-09 NOTE — Telephone Encounter (Signed)
 Could you assist me on next options?  Poor t score on dexa  Failed fosamax  and prolia  made her 'feel sick'  Next steps?

## 2024-04-10 ENCOUNTER — Telehealth: Payer: Self-pay | Admitting: Pharmacist

## 2024-04-10 DIAGNOSIS — M810 Age-related osteoporosis without current pathological fracture: Secondary | ICD-10-CM

## 2024-04-10 NOTE — Telephone Encounter (Signed)
 Agree with recommendations Manuelita  If you wouldn't mind reaching out to patient with recommendations this would be helpful.   If IV desired I will refer to rheumatology (I believe they provide)

## 2024-04-10 NOTE — Progress Notes (Signed)
 Chart Review Reason: Drug information Question - Osteoporosis treatment considerations  Summary: Patient reports prior intolerance (nausea) to oral bisphosphonate and to Prolia .   Pertinent PMH: postural dizziness w near-syncope, GERD, CKD3, on HRT therapy post-menopausal  Current Tx:  Ca/Vit D supplement  Prior Tx:  Alendronate  70mg /wk (nausea)  Prolia  (nausea/insomnia)  Evista  (nausea) Eventity (Tried to start Evenity  again last year but ultimately never started due to injection concerns)  DEXA (10/16/23):  Site   T-score R 1/3 radius -3.1 R femur neck -3.0 L femur neck -3.9 R total femur -2.9  Labs: Lab Results  Component Value Date   CALCIUM 10.1 03/17/2024   CALCIUM 9.4 01/24/2024   CALCIUM 10.1 01/09/2023   CREATININE 0.89 03/17/2024   CREATININE 0.78 01/24/2024   CREATININE 0.81 01/09/2023   PTH 20 07/27/2020   ALBUMIN 4.6 03/17/2024   ALBUMIN 4.4 01/09/2023   ALBUMIN 4.3 07/19/2021   TSH (uIU/mL)  Date Value  01/24/2024 2.04  01/09/2023 3.180  07/27/2020 2.89  08/28/2019 2.51  07/10/2017 2.07   Free T4 (ng/dL)  Date Value  94/83/7977 0.79    Considerations: Ideally, patient is indicated for anabolic therapy though hesitancy toward self-injection though reasonable to re-visit the Evenity  discussion given infrequency of dosing (monthly). Could offer in-office injection training or even regular in-office injection visit if she feels unable to self-administer.  Other anabolic option would be PTH analog though this is a daily self-injection and would likely no be favorable per patient wish to avoid injection.  Anti-resorptive less ideal for the severity of her T-scores, though is reasonable if patient preference remains strong to avoid the above agent(s).  IV bisphosphonate may be ideal in terms of convenience/patient preference to avoid self-injection (once yearly infusion). Nasuea prior to oral bisphosphonate is not expected with IV administration.    Considerations sent to PCP.   Manuelita FABIENE Kobs, PharmD, BCACP, CPP Clinical Pharmacist Practitioner [facility] Ph: [phone]

## 2024-04-15 ENCOUNTER — Ambulatory Visit: Payer: Self-pay

## 2024-04-16 ENCOUNTER — Encounter (HOSPITAL_COMMUNITY): Payer: Self-pay | Admitting: Family

## 2024-04-16 ENCOUNTER — Other Ambulatory Visit: Payer: Self-pay | Admitting: Family

## 2024-04-16 NOTE — Telephone Encounter (Signed)
 Thanks so much, much appreciated.

## 2024-04-17 ENCOUNTER — Encounter: Payer: Self-pay | Admitting: Family

## 2024-04-17 ENCOUNTER — Ambulatory Visit (INDEPENDENT_AMBULATORY_CARE_PROVIDER_SITE_OTHER)
Admission: RE | Admit: 2024-04-17 | Discharge: 2024-04-17 | Disposition: A | Discharge status: Home / Self Care | Source: Ambulatory Visit | Attending: Family | Admitting: Family

## 2024-04-17 ENCOUNTER — Ambulatory Visit: Payer: Self-pay | Admitting: Family

## 2024-04-17 ENCOUNTER — Telehealth: Payer: Self-pay | Admitting: Family

## 2024-04-17 ENCOUNTER — Ambulatory Visit: Admitting: Family

## 2024-04-17 VITALS — BP 124/80 | HR 68 | Temp 98.1°F | Ht 61.0 in | Wt 96.4 lb

## 2024-04-17 DIAGNOSIS — S3992XA Unspecified injury of lower back, initial encounter: Secondary | ICD-10-CM

## 2024-04-17 DIAGNOSIS — M25552 Pain in left hip: Secondary | ICD-10-CM | POA: Diagnosis not present

## 2024-04-17 DIAGNOSIS — R400 Somnolence: Secondary | ICD-10-CM

## 2024-04-17 DIAGNOSIS — S79911A Unspecified injury of right hip, initial encounter: Secondary | ICD-10-CM

## 2024-04-17 DIAGNOSIS — S79912A Unspecified injury of left hip, initial encounter: Secondary | ICD-10-CM

## 2024-04-17 DIAGNOSIS — R0609 Other forms of dyspnea: Secondary | ICD-10-CM

## 2024-04-17 DIAGNOSIS — M25551 Pain in right hip: Secondary | ICD-10-CM

## 2024-04-17 NOTE — Telephone Encounter (Signed)
 Pt with persistent fatigue and tiring out easily.  She does have some depression as well.  Sensitive to medications we have tried She is on amitriptyline  and also clorazepate , long term.  Do you think with her medications some of these may be contributing to the above sx?   Negative cardiac workup  Labs unremarkable.

## 2024-04-17 NOTE — Patient Instructions (Signed)
" °  A referral was placed today for pulmonary for workup of shortness of breath and increased fatigue  Please let us  know if you have not heard back within 2 weeks about the referral.  "

## 2024-04-17 NOTE — Progress Notes (Signed)
 "  Established Patient Office Visit  Subjective:      CC:  Chief Complaint  Patient presents with   Acute Visit    [name] on the ice on Friday and now has tailbone pain    HPI: Kristen Caldwell is a 79 y.o. female presenting on 04/17/2024 for Acute Visit Amon on the ice on Friday and now has tailbone pain) .  Discussed the use of AI scribe software for clinical note transcription with the patient, who gave verbal consent to proceed.  History of Present Illness Kristen Caldwell is a 79 year old female who presents with fatigue and recent fall.  She experiences episodes of fatigue after minimal exertion, feeling 'completely wore out.' These episodes are not associated with shortness of breath or chest discomfort, though overexertion or emotional upset can lead to palpitations. No snoring or waking up with headaches. Her diet includes protein drinks, graham crackers with peanut butter, chicken, turkey, beans, oatmeal, and a boiled egg daily. She avoids beef and consumes lactose-free almond milk drinks due to stomach issues.  A cardiac CTA showed no evidence of plaque buildup or narrowing in her heart arteries. No swelling in her legs and improved shortness of breath since retiring. Thyroid  function and sodium levels are normal, and she is not anemic. She experienced food poisoning after eating at [employer], requiring hospital treatment for nausea.  She fell recently, landing on her right hip and tailbone, resulting in pain in the right hip, tailbone, and back of her legs. She uses rice bags with peppermint and castor oil mixed with lotion for relief, along with Tylenol. She has not been out of the house since the fall and reports that sitting for extended periods exacerbates the pain. She has osteoporosis with low bone density in the right radius, right hip, left hip, and right femur, as shown on prior testing.  Her current medications include amitriptyline  25 mg and  clorazepate . She reports sleeping better since retiring and denies any depressive symptoms since leaving work.         Social history:  Relevant past medical, surgical, family and social history reviewed and updated as indicated. Interim medical history since our last visit reviewed.  Allergies and medications reviewed and updated.  DATA REVIEWED: CHART IN EPIC     ROS: Negative unless specifically indicated above in HPI.   Current Medications[1]        Objective:        BP 124/80 (BP Location: Left Arm, Patient Position: Sitting, Cuff Size: Normal)   Pulse 68   Temp 98.1 F (36.7 C) (Temporal)   Ht 5' 1 (1.549 m)   Wt 96 lb 6.4 oz (43.7 kg)   SpO2 96%   BMI 18.21 kg/m   Physical Exam MUSCULOSKELETAL: No spinal tenderness. No tenderness along the hip bone. No pain with upward range of motion of the hip. No pain with leg extension. No pain with hip abduction.  Wt Readings from Last 3 Encounters:  04/17/24 96 lb 6.4 oz (43.7 kg)  03/26/24 94 lb (42.6 kg)  03/17/24 96 lb 12.5 oz (43.9 kg)    Physical Exam Vitals reviewed.  Constitutional:      General: She is not in acute distress.    Appearance: Normal appearance. She is normal weight. She is not ill-appearing, toxic-appearing or diaphoretic.  HENT:     Head: Normocephalic.  Cardiovascular:     Rate and Rhythm: Normal rate and regular rhythm.  Pulmonary:  Effort: Pulmonary effort is normal.  Musculoskeletal:        General: Normal range of motion.     Right hip: No bony tenderness. Normal range of motion.     Left hip: No bony tenderness. Normal range of motion.     Comments: Negative coccyx tenderness on palpation  Decreased ROM due to pain with flexion   Neurological:     General: No focal deficit present.     Mental Status: She is alert and oriented to person, place, and time. Mental status is at baseline.  Psychiatric:        Mood and Affect: Mood normal.        Behavior: Behavior  normal.        Thought Content: Thought content normal.        Judgment: Judgment normal.          Results Labs TSH: Within normal limits Vitamin B12: High-normal Sodium: Normalized  Radiology Cardiac CTA: No evidence of coronary artery plaque or stenosis; no significant extracardiac abnormalities  Diagnostic Echocardiogram: Grade 1 diastolic dysfunction DXA (right radius, right hip, left hip, right femur) (2025) (2025): Severe osteoporosis in right radius, right hip, left hip, and right femur  Assessment & Plan:   Assessment and Plan Assessment & Plan Fall with bilateral hip and coccyx pain Recent fall resulting in bilateral hip and coccyx pain, more pronounced on the right side. Pain exacerbated by sitting and certain movements. No immediate fractures suspected, but x-rays are warranted due to osteoporosis and high fracture risk. - Ordered right hip and tailbone x-rays to rule out fractures. - Recommended use of a donut pillow to alleviate tailbone pressure. - Continue using rice bags with peppermint and castor oil mixed with lotion for pain relief. - Continue taking Tylenol for pain management.  Exertional dyspnea and fatigue Persistent fatigue and exertional dyspnea, improved since retirement. Cardiac workup including coronary CTA and echocardiogram showed no significant findings. Labs are unremarkable. Differential includes sleep apnea and pulmonary issues. Depression has improved since retirement. - Referred to pulmonary specialist for further evaluation, including potential sleep apnea and pulmonary function tests. - Continue to monitor blood pressure and maintain current management for grade one diastolic dysfunction.  Osteoporosis Severe osteoporosis with low bone density in the right radius, right hip, left hip, and right femur. High risk of fractures due to low bone density. Recent fall highlights the need for fracture prevention strategies. - Continue current  osteoporosis management plan to prevent further bone density loss. - Discussed with pharmacist potential medication adjustments to minimize fracture risk.  Adverse effects of psychoactive medications Potential contribution of amitriptyline  and clorazepate  to fatigue and drowsiness. Clorazepate  is particularly concerning in older adults due to increased risk of fatigue and drowsiness. Amitriptyline  use in older adults is generally avoided due to potential side effects. - Consider reducing amitriptyline  dose by taking two 10 mg tablets instead of one 25 mg tablet. - Consulted with pharmacist to evaluate potential medication adjustments to minimize adverse effects.        Return in about 3 months (around 07/15/2024) for f/u fatigue .     Kristen Patrick, MSN, APRN, FNP-C [facility]        [1]  Current Outpatient Medications:    acetaminophen (TYLENOL) 325 MG tablet, Take 650 mg by mouth every 6 (six) hours as needed., Disp: , Rfl:    amitriptyline  (ELAVIL ) 25 MG tablet, Take 1 tablet (25 mg total) by mouth at bedtime., Disp:  90 tablet, Rfl: 1   Calcium Carbonate-Vit D-Min (CALTRATE 600+D PLUS) 600-400 MG-UNIT per tablet, Take 2 tablets by mouth daily., Disp: , Rfl:    clobetasol  cream (TEMOVATE ) 0.05 %, Apply 1 Application topically 2 (two) times daily., Disp: 30 g, Rfl: 0   clorazepate  (TRANXENE ) 3.75 MG tablet, Take 1 tablet by mouth once daily, Disp: 90 tablet, Rfl: 1   Cranberry (AZO CRANBERRY GUMMIES) 250 MG CHEW, Chew 250 mg by mouth daily., Disp: , Rfl:    estradiol  (ESTRACE ) 0.5 MG tablet, Take 0.5 tablets (0.25 mg total) by mouth daily., Disp: 45 tablet, Rfl: 1   fluticasone  (FLONASE ) 50 MCG/ACT nasal spray, Place 2 sprays into both nostrils daily., Disp: 16 g, Rfl: 1   loratadine  (CLARITIN ) 10 MG tablet, Take 1 tablet (10 mg total) by mouth daily., Disp: , Rfl:    meclizine  (ANTIVERT ) 12.5 MG tablet, Take 1 tablet (12.5 mg total) by mouth 3 (three)  times daily as needed for dizziness., Disp: 30 tablet, Rfl: 0   Multiple Vitamins-Minerals (CENTRUM SILVER) CHEW, Chew 1 tablet by mouth daily., Disp: , Rfl:    omeprazole  (PRILOSEC) 20 MG capsule, Take 1 capsule by mouth once daily, Disp: 90 capsule, Rfl: 0   ondansetron  (ZOFRAN -ODT) 4 MG disintegrating tablet, Take 1 tablet (4 mg total) by mouth every 8 (eight) hours as needed., Disp: 12 tablet, Rfl: 0   Saccharomyces boulardii (PROBIOTIC) 250 MG CAPS, Take 1 capsule by mouth daily., Disp: , Rfl:    topiramate  (TOPAMAX ) 25 MG tablet, Take 1 tablet by mouth twice daily, Disp: 180 tablet, Rfl: 0   TURMERIC PO, Take by mouth., Disp: , Rfl:   "

## 2024-05-22 ENCOUNTER — Ambulatory Visit: Payer: Self-pay | Admitting: Internal Medicine

## 2024-05-30 ENCOUNTER — Ambulatory Visit: Admitting: Internal Medicine

## 2024-06-03 ENCOUNTER — Encounter: Admitting: Family

## 2024-11-28 ENCOUNTER — Encounter: Admitting: Family

## 2024-11-29 ENCOUNTER — Ambulatory Visit
